# Patient Record
Sex: Male | Born: 1960 | Race: White | Hispanic: No | Marital: Single | Smoking: Never smoker
Health system: Southern US, Community
[De-identification: ages and names within clinical notes are randomized; demographics above are authoritative.]

## PROBLEM LIST (undated history)

## (undated) DIAGNOSIS — J45909 Unspecified asthma, uncomplicated: Secondary | ICD-10-CM

## (undated) DIAGNOSIS — I519 Heart disease, unspecified: Secondary | ICD-10-CM

## (undated) DIAGNOSIS — Z8711 Personal history of peptic ulcer disease: Secondary | ICD-10-CM

## (undated) DIAGNOSIS — M1990 Unspecified osteoarthritis, unspecified site: Secondary | ICD-10-CM

## (undated) DIAGNOSIS — E039 Hypothyroidism, unspecified: Secondary | ICD-10-CM

## (undated) DIAGNOSIS — E119 Type 2 diabetes mellitus without complications: Secondary | ICD-10-CM

## (undated) DIAGNOSIS — I10 Essential (primary) hypertension: Secondary | ICD-10-CM

## (undated) HISTORY — PX: CARDIAC VALVE REPLACEMENT: SHX585

## (undated) HISTORY — PX: CORONARY ARTERY BYPASS GRAFT: SHX141

## (undated) HISTORY — PX: LEG SURGERY: SHX1003

## (undated) HISTORY — PX: ABDOMINAL SURGERY: SHX537

---

## 2001-09-26 ENCOUNTER — Ambulatory Visit (HOSPITAL_COMMUNITY): Admission: RE | Admit: 2001-09-26 | Discharge: 2001-09-26 | Payer: Self-pay | Admitting: Family Medicine

## 2001-09-26 ENCOUNTER — Encounter: Payer: Self-pay | Admitting: Family Medicine

## 2002-04-01 ENCOUNTER — Ambulatory Visit (HOSPITAL_COMMUNITY): Admission: RE | Admit: 2002-04-01 | Discharge: 2002-04-01 | Payer: Self-pay | Admitting: Family Medicine

## 2002-04-01 ENCOUNTER — Encounter: Payer: Self-pay | Admitting: Family Medicine

## 2002-08-12 ENCOUNTER — Emergency Department (HOSPITAL_COMMUNITY): Admission: EM | Admit: 2002-08-12 | Discharge: 2002-08-12 | Payer: Self-pay | Admitting: Emergency Medicine

## 2002-08-12 ENCOUNTER — Encounter: Payer: Self-pay | Admitting: Emergency Medicine

## 2004-03-18 ENCOUNTER — Emergency Department (HOSPITAL_COMMUNITY): Admission: EM | Admit: 2004-03-18 | Discharge: 2004-03-19 | Payer: Self-pay | Admitting: Emergency Medicine

## 2004-03-21 ENCOUNTER — Ambulatory Visit (HOSPITAL_COMMUNITY): Admission: RE | Admit: 2004-03-21 | Discharge: 2004-03-21 | Payer: Self-pay | Admitting: Family Medicine

## 2004-03-25 ENCOUNTER — Ambulatory Visit (HOSPITAL_COMMUNITY): Admission: RE | Admit: 2004-03-25 | Discharge: 2004-03-25 | Payer: Self-pay | Admitting: Family Medicine

## 2005-03-02 ENCOUNTER — Ambulatory Visit (HOSPITAL_COMMUNITY): Admission: RE | Admit: 2005-03-02 | Discharge: 2005-03-02 | Payer: Self-pay | Admitting: Family Medicine

## 2007-12-12 ENCOUNTER — Encounter (HOSPITAL_COMMUNITY): Admission: RE | Admit: 2007-12-12 | Discharge: 2007-12-18 | Payer: Self-pay | Admitting: Orthopedic Surgery

## 2007-12-19 ENCOUNTER — Encounter (HOSPITAL_COMMUNITY): Admission: RE | Admit: 2007-12-19 | Discharge: 2008-01-18 | Payer: Self-pay | Admitting: Orthopedic Surgery

## 2008-01-21 ENCOUNTER — Encounter (HOSPITAL_COMMUNITY): Admission: RE | Admit: 2008-01-21 | Discharge: 2008-02-20 | Payer: Self-pay | Admitting: Orthopedic Surgery

## 2009-04-19 ENCOUNTER — Ambulatory Visit (HOSPITAL_COMMUNITY): Admission: RE | Admit: 2009-04-19 | Discharge: 2009-04-19 | Payer: Self-pay | Admitting: Family Medicine

## 2011-08-08 DIAGNOSIS — J984 Other disorders of lung: Secondary | ICD-10-CM | POA: Insufficient documentation

## 2011-08-08 DIAGNOSIS — Q213 Tetralogy of Fallot: Secondary | ICD-10-CM | POA: Diagnosis not present

## 2011-08-08 DIAGNOSIS — Z8679 Personal history of other diseases of the circulatory system: Secondary | ICD-10-CM | POA: Insufficient documentation

## 2011-08-08 DIAGNOSIS — I10 Essential (primary) hypertension: Secondary | ICD-10-CM | POA: Insufficient documentation

## 2011-08-08 DIAGNOSIS — E119 Type 2 diabetes mellitus without complications: Secondary | ICD-10-CM | POA: Diagnosis not present

## 2011-08-08 DIAGNOSIS — M25562 Pain in left knee: Secondary | ICD-10-CM | POA: Insufficient documentation

## 2011-08-08 DIAGNOSIS — Z9889 Other specified postprocedural states: Secondary | ICD-10-CM | POA: Insufficient documentation

## 2011-08-08 DIAGNOSIS — Z954 Presence of other heart-valve replacement: Secondary | ICD-10-CM | POA: Diagnosis not present

## 2011-11-22 DIAGNOSIS — IMO0002 Reserved for concepts with insufficient information to code with codable children: Secondary | ICD-10-CM | POA: Diagnosis not present

## 2011-11-22 DIAGNOSIS — M1710 Unilateral primary osteoarthritis, unspecified knee: Secondary | ICD-10-CM | POA: Diagnosis not present

## 2011-11-22 DIAGNOSIS — M259 Joint disorder, unspecified: Secondary | ICD-10-CM | POA: Diagnosis not present

## 2011-11-22 DIAGNOSIS — M25569 Pain in unspecified knee: Secondary | ICD-10-CM | POA: Diagnosis not present

## 2011-11-22 DIAGNOSIS — M25469 Effusion, unspecified knee: Secondary | ICD-10-CM | POA: Diagnosis not present

## 2012-02-21 DIAGNOSIS — M1710 Unilateral primary osteoarthritis, unspecified knee: Secondary | ICD-10-CM | POA: Diagnosis not present

## 2012-02-21 DIAGNOSIS — IMO0002 Reserved for concepts with insufficient information to code with codable children: Secondary | ICD-10-CM | POA: Diagnosis not present

## 2012-05-22 DIAGNOSIS — IMO0002 Reserved for concepts with insufficient information to code with codable children: Secondary | ICD-10-CM | POA: Diagnosis not present

## 2012-05-30 ENCOUNTER — Ambulatory Visit (HOSPITAL_COMMUNITY)
Admission: RE | Admit: 2012-05-30 | Discharge: 2012-05-30 | Disposition: A | Discharge status: Home / Self Care | Payer: Medicare Other | Source: Ambulatory Visit | Attending: Orthopedic Surgery | Admitting: Orthopedic Surgery

## 2012-05-30 DIAGNOSIS — M25569 Pain in unspecified knee: Secondary | ICD-10-CM | POA: Insufficient documentation

## 2012-05-30 DIAGNOSIS — IMO0001 Reserved for inherently not codable concepts without codable children: Secondary | ICD-10-CM | POA: Insufficient documentation

## 2012-05-30 DIAGNOSIS — R262 Difficulty in walking, not elsewhere classified: Secondary | ICD-10-CM | POA: Diagnosis not present

## 2012-05-30 DIAGNOSIS — M6281 Muscle weakness (generalized): Secondary | ICD-10-CM | POA: Diagnosis not present

## 2012-05-30 DIAGNOSIS — M25669 Stiffness of unspecified knee, not elsewhere classified: Secondary | ICD-10-CM | POA: Insufficient documentation

## 2012-05-30 DIAGNOSIS — R29898 Other symptoms and signs involving the musculoskeletal system: Secondary | ICD-10-CM | POA: Insufficient documentation

## 2012-05-30 NOTE — Evaluation (Addendum)
Physical Therapy Evaluation  Patient Details  Name: Jay Skinner MRN: [id number] Date of Birth: 07-Aug-1960  Today's Date: 05/30/2012 Time: 0940-1020 PT Time Calculation (min): 40 min              Visit#: 1 of 8  Re-eval: 06/29/12 Assessment Diagnosis: L knee OA Charge:  Eval; there ex x 10 Authorization: medicare    Authorization Visit#: 1 of 8   Past Medical History: No past medical history on file. Past Surgical History: No past surgical history on file.  Subjective Symptoms/Limitations Symptoms: Jay Skinner is a poor historian.  He  states that he had  surgery on his left knee four years ago and he knee has never been the same. He is unsure what surgery was done or why it was done.  The pt has two incisional scars one that runs vertical along the anterior aspect of lower thigh and upper leg that appears to look like a TKR scar.  The second is horizontal along the superior aspcet of  his L LE.   He states the knee just won't bend right. How long can you sit comfortably?: He is able to sit as long as he wants. How long can you stand comfortably?: The patient is able to stand as long as wants. How long can you walk comfortably?: The pateint states that he starts having increase pain when walking after about five minutes.  After 20 minutes he needs to stop walking and rest. Pain Assessment Currently in Pain?: Yes Pain Score:   5 Pain Location: Knee Pain Orientation: Anterior   Cognition/Observation Cognition Overall Cognitive Status: Appears within functional limits for tasks assessed Observation/Other Assessments Observations:  (Pt has antalgic gait with decreased knee flex, ankle dorsiflexion with decreased stance time on Lt  LE)  Sensation/Coordination/Flexibility/Functional Tests Flexibility Thomas: Positive 90/90: Positive  Assessment LLE AROM (degrees) Left Knee Extension: 0 Left Knee Flexion: 115 LLE Strength Left Hip Flexion: 5/5 Left Hip Extension:  3/5 Left Hip ABduction:  (4+/5) Left Hip ADduction: 5/5 Left Knee Flexion: 4/5 Left Knee Extension: 3+/5 Left Ankle Dorsiflexion: 4/5 Left Ankle Plantar Flexion: 2/5  Exercise/Treatments Mobility/Balance  Static Standing Balance Single Leg Stance - Right Leg: 2 Single Leg Stance - Left Leg: 1   Stretches Quad Stretch: 3 reps;30 seconds Hip Flexor Stretch: 1 rep;60 seconds Aerobic Stationary Bike: 6' @ 1.0   Standing Heel Raises: 10 reps SLS: 1" max L; 2" max R Seated Long Arc Quad: 10 reps Supine Quad Sets: 10 reps   Prone  Hamstring Curl: 10 reps Hip Extension: 10 reps    Physical Therapy Assessment and Plan PT Assessment and Plan Clinical Impression Statement: Pt with abnormal gait due to old habits as well as weakness and ROM deficits who will benefit from skilled therapy to improve gait, strength, ROM and balance to improve pt furnctional mobility as well as decrease the risk of falls. Pt will benefit from skilled therapeutic intervention in order to improve on the following deficits: Abnormal gait;Decreased balance;Decreased activity tolerance;Difficulty walking;Pain;Decreased range of motion;Decreased strength Rehab Potential: Good PT Frequency: Min 2X/week PT Duration: 4 weeks PT Treatment/Interventions: Gait training;Therapeutic exercise;Therapeutic activities;Balance training;Patient/family education;Manual techniques;Modalities PT Plan: begin manual on scars; rockerboard, knee flextion, and gait training.  Progress pt to lunge walking and chair stretches.and sit to stand exercises.    Goals Home Exercise Program Pt will Perform Home Exercise Program: Independently PT Short Term Goals Time to Complete Short Term Goals: 2 weeks PT Short Term Goal 1:  Pt ROM to be to 120 to allow pt to go into a squatted posion to pici items off the floor. PT Short Term Goal 2: strength increased 1/2 grade to allow pt to walk fo 15 minutes without pain. PT Short Term Goal 3: Pt  pain no greater than a 3/10 PT Long Term Goals Time to Complete Long Term Goals: 4 weeks PT Long Term Goal 1: I in advance HEP PT Long Term Goal 2: Pt SLS improved to 10 seconds  Bilaterally  to decrease risk of falling Long Term Goal 3: strength improved one grade to allow pt to rise from squatted position. Long Term Goal 4: Pain no greater than a 1/10 80% of the day PT Long Term Goal 5: Pt to be able to walk for 30 minutes without pain.  Problem List Patient Active Problem List  Diagnosis  . Stiffness of knee joint  . Difficulty in walking  . Left leg weakness    PT Plan of Care PT Home Exercise Plan: given  GP Functional Assessment Tool Used: clinical judgement Functional Limitation: Mobility: Walking and moving around Mobility: Walking and Moving Around Current Status ([id number]): At least 40 percent but less than 59% percent impaired, limited or restricted Mobility: Walking and Moving Around Goal Status [phone]): At least 1 percent but less than 20 percent impaired, limited or restricted  RUSSELL,CINDY 05/30/2012, 10:42 AM  Physician Documentation Your signature is required to indicate approval of the treatment plan as stated above.  Please sign and either send electronically or make a copy of this report for your files and return this physician signed original.   Please mark one 1.__approve of plan  2. ___approve of plan with the following conditions.   ______________________________                                                          _____________________ Physician Signature                                                                                                             Date

## 2012-06-03 ENCOUNTER — Ambulatory Visit (HOSPITAL_COMMUNITY)
Admission: RE | Admit: 2012-06-03 | Discharge: 2012-06-03 | Disposition: A | Discharge status: Home / Self Care | Payer: Medicare Other | Source: Ambulatory Visit | Attending: Orthopedic Surgery | Admitting: Orthopedic Surgery

## 2012-06-03 DIAGNOSIS — R29898 Other symptoms and signs involving the musculoskeletal system: Secondary | ICD-10-CM

## 2012-06-03 DIAGNOSIS — R262 Difficulty in walking, not elsewhere classified: Secondary | ICD-10-CM

## 2012-06-03 NOTE — Progress Notes (Signed)
Physical Therapy Treatment Patient Details  Name: AYODEJI KEIMIG MRN: [id number] Date of Birth: [birth year]  Today's Date: 06/03/2012 Time: 1125-1208 PT Time Calculation (min): 43 min  Visit#: 2 of 8  Re-eval: 06/29/12  charge:  39 min exercise  Authorization: medicare     Authorization Visit#: 2 of 8   Subjective: Symptoms/Limitations Symptoms: Mr. Montelongo states that he is doing his exercises at home; statesl his L knee still does not want to bend.; Pain Assessment Pain Score:   4 Pain Location: Knee Pain Orientation: Left    Exercise/Treatments     Stretches Quad Stretch: 3 reps;30 seconds Hip Flexor Stretch: 1 rep;60 seconds Aerobic Stationary Bike: 6' @ 1.0   Standing Heel Raises: 10 reps Knee Flexion: 10 reps Forward Step Up: 10 reps Step Down: 10 reps Functional Squat: 10 reps Rocker Board: 2 minutes SLS: 5" max x 5; 2" max R Other Standing Knee Exercises: at parralel bars work on heel toe gt pattern Seated Long Arc Quad: 10 reps;Weights Long Arc Quad Weight: 3 lbs. Supine Terminal Knee Extension: 10 reps Prone  Hamstring Curl: 10 reps Hip Extension: 10 reps Contract/Relax to Increase Flexion:  (5x)      Physical Therapy Assessment and Plan PT Assessment and Plan Clinical Impression Statement: Pt added several standing exercises with verbal cuing needed for all.  Pt having difficulty with heel/toe gait pattern will need continuted reinforcement.l Pt will benefit from skilled therapeutic intervention in order to improve on the following deficits: Abnormal gait;Decreased balance;Decreased activity tolerance;Difficulty walking;Pain;Decreased range of motion;Decreased strength Rehab Potential: Good PT Frequency: Min 2X/week PT Duration: 4 weeks PT Treatment/Interventions: Gait training;Therapeutic exercise;Therapeutic activities;Balance training;Patient/family education;Manual techniques;Modalities PT Plan:  Progress pt to lunge walking and chair  stretches.and sit to stand exercises.      Problem List Patient Active Problem List  Diagnosis  . Stiffness of knee joint  . Difficulty in walking  . Left leg weakness       GP    Keyuna Cuthrell,CINDY 06/03/2012, 12:17 PM

## 2012-06-06 ENCOUNTER — Ambulatory Visit (HOSPITAL_COMMUNITY)
Admission: RE | Admit: 2012-06-06 | Discharge: 2012-06-06 | Disposition: A | Discharge status: Home / Self Care | Payer: Medicare Other | Source: Ambulatory Visit | Attending: Orthopedic Surgery | Admitting: Orthopedic Surgery

## 2012-06-06 NOTE — Progress Notes (Signed)
Physical Therapy Treatment Patient Details  Name: Jay Skinner MRN: [id number] Date of Birth: 28-Mar-1960  Today's Date: 06/06/2012 Time: 7829-5621 PT Time Calculation (min): 33 min  Visit#: 3 of 8  Re-eval: 06/29/12 Charges: Therex x 30'  Authorization: medicare  Authorization Visit#: 3 of 8   Subjective: Symptoms/Limitations Symptoms: Pt reports that he is able to bend his knee with increased ease. Pain Assessment Currently in Pain?: Yes Pain Score:   4 Pain Location: Knee Pain Orientation: Left   Exercise/Treatments Stretches Quad Stretch: 2 reps;30 seconds;Limitations Lobbyist Limitations: Chair stretch Aerobic Stationary Bike: 8'@3 .0 Standing Heel Raises: 10 reps Knee Flexion: 10 reps Forward Step Up: 10 reps;Step Height: 4";Hand Hold: 0;Left Step Down: 10 reps Functional Squat: 10 reps Stairs: 1RT reciprocal with 1 handrail Rocker Board: 2 minutes SLS: L:5" max of 3 Seated Long Arc Quad: 10 reps;Weights Long Arc Quad Weight: 3 lbs. Other Seated Knee Exercises: Sit to stand without UE assist x 10 Supine Terminal Knee Extension: 10 reps Bridges: 10 reps Prone  Hamstring Curl: 10 reps;Limitations Hamstring Curl Limitations: 3# Hip Extension: 10 reps;Limitations Hip Extension Limitations: 3#   Physical Therapy Assessment and Plan PT Assessment and Plan Clinical Impression Statement: Pt requires multimodal cueing for all exercises for proper form and technique. Pt continues to requiring vc's to improve heel/toe gait pattern. Pt displays increased difficulty with stairs descent secondary to decreased quad control.  Pt reports pain decrease to 3/10 at end of session. PT Plan: Continue to progress strength, ROM and gait per PT POC. Begin lunge walking next session.     Problem List Patient Active Problem List  Diagnosis  . Stiffness of knee joint  . Difficulty in walking  . Left leg weakness    General Behavior During Session: [facility] for tasks  performed Cognition: [facility] for tasks performed  Seth Bake, PTA 06/06/2012, 10:11 AM

## 2012-06-10 ENCOUNTER — Ambulatory Visit (HOSPITAL_COMMUNITY): Payer: Medicare Other | Admitting: *Deleted

## 2012-06-10 DIAGNOSIS — J019 Acute sinusitis, unspecified: Secondary | ICD-10-CM | POA: Diagnosis not present

## 2012-06-10 DIAGNOSIS — Q213 Tetralogy of Fallot: Secondary | ICD-10-CM | POA: Diagnosis not present

## 2012-06-10 DIAGNOSIS — Z6830 Body mass index (BMI) 30.0-30.9, adult: Secondary | ICD-10-CM | POA: Diagnosis not present

## 2012-06-10 DIAGNOSIS — I10 Essential (primary) hypertension: Secondary | ICD-10-CM | POA: Diagnosis not present

## 2012-06-13 ENCOUNTER — Ambulatory Visit (HOSPITAL_COMMUNITY)
Admission: RE | Admit: 2012-06-13 | Discharge: 2012-06-13 | Disposition: A | Discharge status: Home / Self Care | Payer: Medicare Other | Source: Ambulatory Visit | Attending: Orthopedic Surgery | Admitting: Orthopedic Surgery

## 2012-06-13 DIAGNOSIS — R29898 Other symptoms and signs involving the musculoskeletal system: Secondary | ICD-10-CM

## 2012-06-13 DIAGNOSIS — R262 Difficulty in walking, not elsewhere classified: Secondary | ICD-10-CM

## 2012-06-13 NOTE — Progress Notes (Signed)
Physical Therapy Treatment Patient Details  Name: Jay Skinner MRN: [id number] Date of Birth: [birth year]  Today's Date: 06/13/2012 Time: 4098-1191 PT Time Calculation (min): 43 min  Visit#: 4 of 8  Re-eval: 06/28/12  charge:  There ex x 42  Authorization: medicare   Authorization Visit#: 4 of 8   Subjective: Symptoms/Limitations Symptoms: Mr. Dinovo states the cold is causing his knee to ache; Pt states he is doing his exercises and walking is easier for him now. Pain Assessment Pain Score:   3 Pain Location: Knee Pain Orientation: Left  Exercise/Treatments Stretches Quad Stretch: 2 reps;30 seconds;Limitations Lobbyist Limitations: Chair stretch Hip Flexor Stretch: 1 rep;60 seconds Aerobic Stationary Bike: 8'@3 .0 Standing Heel Raises: 15 reps Lateral Step Up: 10 reps;Step Height: 6" Forward Step Up: Left;10 reps;Hand Hold: 0;Step Height: 6" Step Down: 10 reps;Step Height: 6" Functional Squat: 10 reps Stairs: 2RT reciprocal with 1 handrail Rocker Board: 2 minutes SLS: L:5" max of 3 Seated Other Seated Knee Exercises: Sit to stand without UE assist x 10 Supine Bridges: 10 reps Other Supine Knee Exercises: Knee to chest x 1' x 2 Sidelying   Prone  Hip Extension: 10 reps;Limitations Hip Extension Limitations: 4#      Physical Therapy Assessment and Plan PT Assessment and Plan Clinical Impression Statement: Pt neees multimodal cueing for all exercises. Pt is improving in dorsiflexing ankle and knee with gait but is now flexing hip to approximately 60 degrees when doing so; therefore gait pattern is still not normal.   Rehab Potential: Good PT Frequency: Min 2X/week PT Duration: 4 weeks PT Treatment/Interventions: Gait training;Therapeutic exercise;Therapeutic activities;Balance training;Patient/family education;Manual techniques;Modalities PT Plan: Work on gait and attempt mrising from squatted position.      Goals Home Exercise Program Pt will  Perform Home Exercise Program: Independently PT Goal: Perform Home Exercise Program - Progress: Met PT Short Term Goals Time to Complete Short Term Goals: 2 weeks PT Short Term Goal 1: Pt ROM to be to 120 to allow pt to go into a squatted posion to pici items off the floor. PT Short Term Goal 1 - Progress: Progressing toward goal PT Short Term Goal 2: strength increased 1/2 grade to allow pt to walk fo 15 minutes without pain. PT Short Term Goal 2 - Progress: Met PT Short Term Goal 3: Pt pain no greater than a 3/10 PT Short Term Goal 3 - Progress: Progressing toward goal PT Long Term Goals Time to Complete Long Term Goals: 4 weeks PT Long Term Goal 1: I in advance HEP PT Long Term Goal 1 - Progress: Met PT Long Term Goal 2: Pt SLS improved to 10 seconds  Bilaterally  to decrease risk of falling PT Long Term Goal 2 - Progress: Progressing toward goal Long Term Goal 3: strength improved one grade to allow pt to rise from squatted position. Long Term Goal 3 Progress: Progressing toward goal Long Term Goal 4: Pain no greater than a 1/10 80% of the day Long Term Goal 4 Progress: Not met PT Long Term Goal 5: Pt to be able to walk for 30 minutes without pain. Long Term Goal 5 Progress: Not met  Problem List Patient Active Problem List  Diagnosis  . Stiffness of knee joint  . Difficulty in walking  . Left leg weakness    General Behavior During Session: [facility] for tasks performed Cognition: [facility] for tasks performed  GP    Shontelle Muska,CINDY 06/13/2012, 10:45 AM

## 2012-06-17 ENCOUNTER — Ambulatory Visit (HOSPITAL_COMMUNITY)
Admission: RE | Admit: 2012-06-17 | Discharge: 2012-06-17 | Disposition: A | Discharge status: Home / Self Care | Payer: Medicare Other | Source: Ambulatory Visit | Attending: Orthopedic Surgery | Admitting: Orthopedic Surgery

## 2012-06-17 DIAGNOSIS — R29898 Other symptoms and signs involving the musculoskeletal system: Secondary | ICD-10-CM

## 2012-06-17 DIAGNOSIS — R262 Difficulty in walking, not elsewhere classified: Secondary | ICD-10-CM

## 2012-06-17 NOTE — Progress Notes (Signed)
out Physical Therapy Treatment Patient Details  Name: Jay Skinner MRN: [id number] Date of Birth: Jul 14, 1960  Today's Date: 06/17/2012 Time: 0940-1020 PT Time Calculation (min): 40 min  Visit#: 5 of 8  Re-eval: 06/28/12  charge:  Leonia Reeves x 12/ There ex x 27  Authorization: medicare     Authorization Visit#: 5 of 8   Subjective: Symptoms/Limitations Symptoms: State that his leg is a little sore. Pain Assessment Pain Score:   4 Pain Location: Knee Pain Orientation: Left    Exercise/Treatments    Stretches Quad Stretch: 2 reps;30 seconds;Limitations Hip Flexor Stretch: 1 rep;60 seconds Aerobic Stationary Bike: 8'@3 .0   Standing Heel Raises: 15 reps Lateral Step Up: 15 reps;Step Height: 6" Forward Step Up: Left;15 reps;Hand Hold: 0;Step Height: 6" Functional Squat: 10 reps (deep squat) Rocker Board: 2 minutes SLS: L:5" max of 3 Other Standing Knee Exercises: working on gait. heel toe Seated Other Seated Knee Exercises: Sit to stand without UE assist x 10    Physical Therapy Assessment and Plan PT Assessment and Plan Clinical Impression Statement: PT continues ton need multimodal cuing with gait but he is showing better form with exercises with minimal cuing.  Pt brother states that he was telling pateint to pick up his Lt leg,(thus pt was walking and flexing hip to 60 degrees causing an abnormal gt.)   Instructed pt's brother that it would be better to cue him to hit with his heel and push off with his toe when ambulating.  Pt able to demonstrate a near normal gati pattern at the end of this session. Pt will benefit from skilled therapeutic intervention in order to improve on the following deficits: Abnormal gait;Decreased balance;Decreased activity tolerance;Difficulty walking;Pain;Decreased range of motion;Decreased strength PT Frequency: Min 2X/week PT Duration: 4 weeks PT Treatment/Interventions: Gait training;Therapeutic exercise;Therapeutic activities;Balance  training;Patient/family education;Manual techniques;Modalities PT Plan: continue on working on gait pattern repeatitively.    Goals  progressing  Problem List Patient Active Problem List  Diagnosis  . Stiffness of knee joint  . Difficulty in walking  . Left leg weakness    PT - End of Session Equipment Utilized During Treatment: Gait belt General Behavior During Session: [facility] for tasks performed Cognition: [facility] for tasks performed  GP    [name],CINDY 06/17/2012, 10:35 AM

## 2012-06-20 ENCOUNTER — Ambulatory Visit (HOSPITAL_COMMUNITY)
Admission: RE | Admit: 2012-06-20 | Discharge: 2012-06-20 | Disposition: A | Discharge status: Home / Self Care | Payer: Medicare Other | Source: Ambulatory Visit | Attending: Orthopedic Surgery | Admitting: Orthopedic Surgery

## 2012-06-20 DIAGNOSIS — M6281 Muscle weakness (generalized): Secondary | ICD-10-CM | POA: Diagnosis not present

## 2012-06-20 DIAGNOSIS — M25669 Stiffness of unspecified knee, not elsewhere classified: Secondary | ICD-10-CM | POA: Insufficient documentation

## 2012-06-20 DIAGNOSIS — R262 Difficulty in walking, not elsewhere classified: Secondary | ICD-10-CM | POA: Insufficient documentation

## 2012-06-20 DIAGNOSIS — M25569 Pain in unspecified knee: Secondary | ICD-10-CM | POA: Insufficient documentation

## 2012-06-20 DIAGNOSIS — IMO0001 Reserved for inherently not codable concepts without codable children: Secondary | ICD-10-CM | POA: Diagnosis not present

## 2012-06-20 NOTE — Progress Notes (Signed)
Physical Therapy Treatment Patient Details  Name: Jay Skinner MRN: [id number] Date of Birth: September 01, 1960  Today's Date: 06/20/2012 Time: 0930-1005 PT Time Calculation (min): 35 min  Visit#: 6 of 8  Re-eval: 06/28/12 Charges: Therex x 23' Gait x 8'  Authorization: medicare  Authorization Visit#: 6 of 8   Subjective: Symptoms/Limitations Symptoms: Pt states that he has been doing his quad stretch every night and it has really helped. Pain Assessment Currently in Pain?: Yes Pain Score:   3 Pain Location: Knee Pain Orientation: Left   Exercise/Treatments Aerobic Stationary Bike: 10'@3 .0 for strength and activity tolerance Standing Heel Raises: 15 reps;Limitations Heel Raises Limitations: toe raises x 15 Lateral Step Up: 15 reps;Step Height: 6" Forward Step Up: 15 reps;Step Height: 6";Hand Hold: 0;Left Functional Squat: 15 reps Rocker Board: 2 minutes Other Standing Knee Exercises: working on gait. heel toe Seated Other Seated Knee Exercises: Sit to stand without UE assist x 10  Physical Therapy Assessment and Plan PT Assessment and Plan Clinical Impression Statement: Pt continues to require moderate multimodal cueing for each exercises and gait. Pt's strength is progressing well and pain appears to be decreasing. Pt continues to lack balance. Began tandem gait to improve proprioceptive control. Pt reports decreased pain at end of session. PT Plan: Continue to progress strength, stability and gait per PT POC.    Problem List Patient Active Problem List  Diagnosis  . Stiffness of knee joint  . Difficulty in walking  . Left leg weakness    PT - End of Session Activity Tolerance: Patient tolerated treatment well General Behavior During Session: [facility] for tasks performed Cognition: [facility] for tasks performed  Seth Bake, PTA  06/20/2012, 10:14 AM

## 2012-06-24 ENCOUNTER — Ambulatory Visit (HOSPITAL_COMMUNITY): Payer: Medicare Other | Admitting: *Deleted

## 2012-06-25 DIAGNOSIS — Z0000 Encounter for general adult medical examination without abnormal findings: Secondary | ICD-10-CM | POA: Diagnosis not present

## 2012-06-25 DIAGNOSIS — J45909 Unspecified asthma, uncomplicated: Secondary | ICD-10-CM | POA: Diagnosis not present

## 2012-06-25 DIAGNOSIS — F79 Unspecified intellectual disabilities: Secondary | ICD-10-CM | POA: Diagnosis not present

## 2012-06-25 DIAGNOSIS — Z23 Encounter for immunization: Secondary | ICD-10-CM | POA: Diagnosis not present

## 2012-06-25 DIAGNOSIS — J019 Acute sinusitis, unspecified: Secondary | ICD-10-CM | POA: Diagnosis not present

## 2012-06-25 DIAGNOSIS — Z6832 Body mass index (BMI) 32.0-32.9, adult: Secondary | ICD-10-CM | POA: Diagnosis not present

## 2012-06-27 ENCOUNTER — Ambulatory Visit (HOSPITAL_COMMUNITY)
Admission: RE | Admit: 2012-06-27 | Discharge: 2012-06-27 | Disposition: A | Discharge status: Home / Self Care | Payer: Medicare Other | Source: Ambulatory Visit | Attending: Orthopedic Surgery | Admitting: Orthopedic Surgery

## 2012-06-27 DIAGNOSIS — R262 Difficulty in walking, not elsewhere classified: Secondary | ICD-10-CM

## 2012-06-27 DIAGNOSIS — M25569 Pain in unspecified knee: Secondary | ICD-10-CM | POA: Diagnosis not present

## 2012-06-27 DIAGNOSIS — M25669 Stiffness of unspecified knee, not elsewhere classified: Secondary | ICD-10-CM | POA: Diagnosis not present

## 2012-06-27 DIAGNOSIS — M6281 Muscle weakness (generalized): Secondary | ICD-10-CM | POA: Diagnosis not present

## 2012-06-27 DIAGNOSIS — IMO0001 Reserved for inherently not codable concepts without codable children: Secondary | ICD-10-CM | POA: Diagnosis not present

## 2012-06-27 DIAGNOSIS — R29898 Other symptoms and signs involving the musculoskeletal system: Secondary | ICD-10-CM

## 2012-06-27 NOTE — Progress Notes (Signed)
Physical Therapy Treatment Patient Details  Name: CALLIN ASHE MRN: [id number] Date of Birth: Jul 24, 1960  Today's Date: 06/27/2012 Time: 0930-1010 PT Time Calculation (min): 40 min  Visit#: 7 of 8  Re-eval: 06/28/12    Authorization: medicare  Charge: there ex x 38'  Authorization Visit#: 7 of 8   Subjective: Symptoms/Limitations Symptoms: Pt states that he does not have any pain in his knee anylonger unless he is stressing it. Pain Assessment Pain Score:   2 Pain Location: Knee Pain Orientation: Left  Exercise/Treatments   Aerobic Stationary Bike: 8'@3 .0 for strength and activity tolerance   Standing Heel Raises: 15 reps;Limitations Lateral Step Up: 15 reps;Step Height: 6" Forward Step Up: 15 reps;Step Height: 6";Hand Hold: 0;Left Step Down: 15 reps Functional Squat: 15 reps Wall Squat: 10 reps Lunge Walking - Round Trips: 2 RT Stairs:  (2 RT) Rocker Board: 2 minutes SLS: L x 8" max Gait Training: for heel toe gt. Other Standing Knee Exercises: lunge with L LE on Bosu    Physical Therapy Assessment and Plan PT Assessment and Plan Clinical Impression Statement: Pt needed multimodal cueing for lunge walking, verbal cuing for lunges onto bosu.  Pt continues to lack push-off with gait although he is doing much better with the heelstrike.   PT Treatment/Interventions: Gait training;Therapeutic exercise;Therapeutic activities;Balance training;Patient/family education;Manual techniques;Modalities PT Plan: Continue to progress strength, stability and gait per PT POC.    Goals  progressing  Problem List Patient Active Problem List  Diagnosis  . Stiffness of knee joint  . Difficulty in walking  . Left leg weakness    PT - End of Session Activity Tolerance: Patient tolerated treatment well General Behavior During Session: [facility] for tasks performed Cognition: [facility] for tasks performed  GP    Erlean Mealor,CINDY 06/27/2012, 12:16 PM

## 2012-07-01 ENCOUNTER — Ambulatory Visit (HOSPITAL_COMMUNITY): Payer: Medicare Other | Admitting: *Deleted

## 2012-07-02 ENCOUNTER — Ambulatory Visit (HOSPITAL_COMMUNITY)
Admission: RE | Admit: 2012-07-02 | Discharge: 2012-07-02 | Disposition: A | Discharge status: Home / Self Care | Payer: Medicare Other | Source: Ambulatory Visit | Attending: Orthopedic Surgery | Admitting: Orthopedic Surgery

## 2012-07-02 DIAGNOSIS — R262 Difficulty in walking, not elsewhere classified: Secondary | ICD-10-CM | POA: Diagnosis not present

## 2012-07-02 DIAGNOSIS — M25569 Pain in unspecified knee: Secondary | ICD-10-CM | POA: Diagnosis not present

## 2012-07-02 DIAGNOSIS — M6281 Muscle weakness (generalized): Secondary | ICD-10-CM | POA: Diagnosis not present

## 2012-07-02 DIAGNOSIS — M25669 Stiffness of unspecified knee, not elsewhere classified: Secondary | ICD-10-CM | POA: Diagnosis not present

## 2012-07-02 DIAGNOSIS — IMO0001 Reserved for inherently not codable concepts without codable children: Secondary | ICD-10-CM | POA: Diagnosis not present

## 2012-07-02 NOTE — Evaluation (Addendum)
Physical Therapy Re-evaluation/Discharge Summary  Patient Details  Name: Jay Skinner MRN: [id number] Date of Birth: 1960/10/06  Today's Date: 07/02/2012 Time: 1140-1200 PT Time Calculation (min): 20 min              Visit#: 8 of 8  Re-eval: 06/28/12 Charges: MMT x 1 ROMM x 1   Authorization: medicare    Authorization Visit#: 8 of 8   Past Medical History: No past medical history on file. Past Surgical History: No past surgical history on file.  Subjective Symptoms/Limitations Symptoms: Pt states that he feels like he's doing much better and he's comfortable with d/c to HEP. Pain Assessment Currently in Pain?: No/denies   Sensation/Coordination/Flexibility/Functional Tests Functional Tests Functional Tests: LEFS 59/20 (was 39/80)  Assessment LLE AROM (degrees) Left Knee Extension: 0 Left Knee Flexion: 122 (was 115) LLE Strength Left Hip Flexion: 5/5 Left Hip Extension: 5/5 (was 3/5) Left Hip ABduction: 5/5 (was 4+/5) Left Hip ADduction: 5/5 Left Knee Flexion: 5/5 (was 4/5) Left Knee Extension: 5/5 (was 3+/5) Left Ankle Dorsiflexion: 5/5 (was 4/5) Left Ankle Plantar Flexion: 4/5 (was 2/5)  Physical Therapy Assessment and Plan PT Assessment and Plan Clinical Impression Statement: Pt has met all goals except for SLS which is 8". Strength is WFL. LEFS improved 20 pts. Pt's gait mechanics have improved. Pt is comfortable with d/c to HEP.  PT Treatment/Interventions: Gait training;Therapeutic exercise;Therapeutic activities;Balance training;Patient/family education;Manual techniques;Modalities PT Plan: Recommend to d/c to [facility]P.    Goals Home Exercise Program Pt will Perform Home Exercise Program: Independently PT Short Term Goals Time to Complete Short Term Goals: 2 weeks PT Short Term Goal 1: Pt ROM to be to 120 to allow pt to go into a squatted posion to pici items off the floor. PT Short Term Goal 1 - Progress: Met PT Short Term Goal 2: strength increased 1/2  grade to allow pt to walk fo 15 minutes without pain. PT Short Term Goal 2 - Progress: Met PT Short Term Goal 3: Pt pain no greater than a 3/10 PT Short Term Goal 3 - Progress: Met PT Long Term Goals Time to Complete Long Term Goals: 4 weeks PT Long Term Goal 1: I in advance HEP PT Long Term Goal 1 - Progress: Met PT Long Term Goal 2: Pt SLS improved to 10 seconds  Bilaterally  to decrease risk of falling PT Long Term Goal 2 - Progress: Partly met (SLS max 8") Long Term Goal 3: strength improved one grade to allow pt to rise from squatted position. Long Term Goal 3 Progress: Met Long Term Goal 4: Pain no greater than a 1/10 80% of the day Long Term Goal 4 Progress: Met PT Long Term Goal 5: Pt to be able to walk for 30 minutes without pain. Long Term Goal 5 Progress: Met  Problem List Patient Active Problem List  Diagnosis  . Stiffness of knee joint  . Difficulty in walking  . Left leg weakness    PT - End of Session Activity Tolerance: Patient tolerated treatment well General Behavior During Session: [facility] for tasks performed Cognition: [facility] for tasks performed  GP Functional Limitation: Mobility: Walking and moving around Mobility: Walking and Moving Around Goal Status [phone]): At least 1 percent but less than 20 percent impaired, limited or restricted Mobility: Walking and Moving Around Discharge Status [phone]): At least 1 percent but less than 20 percent impaired, limited or restricted  Seth Bake, PTA  07/02/2012, 12:07 PM  Physician Documentation Your signature is required  to indicate approval of the treatment plan as stated above.  Please sign and either send electronically or make a copy of this report for your files and return this physician signed original.   Please mark one 1.__approve of plan  2. ___approve of plan with the following conditions.   ______________________________                                                           _____________________ Physician Signature                                                                                                             Date

## 2012-08-13 DIAGNOSIS — J984 Other disorders of lung: Secondary | ICD-10-CM | POA: Diagnosis not present

## 2012-08-13 DIAGNOSIS — Z8679 Personal history of other diseases of the circulatory system: Secondary | ICD-10-CM | POA: Diagnosis not present

## 2012-08-13 DIAGNOSIS — I10 Essential (primary) hypertension: Secondary | ICD-10-CM | POA: Diagnosis not present

## 2012-08-13 DIAGNOSIS — I369 Nonrheumatic tricuspid valve disorder, unspecified: Secondary | ICD-10-CM | POA: Diagnosis not present

## 2012-08-13 DIAGNOSIS — Z9889 Other specified postprocedural states: Secondary | ICD-10-CM | POA: Diagnosis not present

## 2012-08-13 DIAGNOSIS — Q213 Tetralogy of Fallot: Secondary | ICD-10-CM | POA: Diagnosis not present

## 2012-08-16 DIAGNOSIS — Q213 Tetralogy of Fallot: Secondary | ICD-10-CM | POA: Diagnosis not present

## 2012-09-05 DIAGNOSIS — Q213 Tetralogy of Fallot: Secondary | ICD-10-CM | POA: Diagnosis not present

## 2012-12-19 DIAGNOSIS — J984 Other disorders of lung: Secondary | ICD-10-CM | POA: Diagnosis not present

## 2012-12-19 DIAGNOSIS — Q213 Tetralogy of Fallot: Secondary | ICD-10-CM | POA: Diagnosis not present

## 2012-12-19 DIAGNOSIS — IMO0002 Reserved for concepts with insufficient information to code with codable children: Secondary | ICD-10-CM | POA: Diagnosis not present

## 2012-12-19 DIAGNOSIS — M1710 Unilateral primary osteoarthritis, unspecified knee: Secondary | ICD-10-CM | POA: Diagnosis not present

## 2012-12-19 DIAGNOSIS — I10 Essential (primary) hypertension: Secondary | ICD-10-CM | POA: Diagnosis not present

## 2013-01-17 DIAGNOSIS — Z23 Encounter for immunization: Secondary | ICD-10-CM | POA: Diagnosis not present

## 2013-04-05 DIAGNOSIS — Z6831 Body mass index (BMI) 31.0-31.9, adult: Secondary | ICD-10-CM | POA: Diagnosis not present

## 2013-04-05 DIAGNOSIS — J111 Influenza due to unidentified influenza virus with other respiratory manifestations: Secondary | ICD-10-CM | POA: Diagnosis not present

## 2013-04-05 DIAGNOSIS — M1990 Unspecified osteoarthritis, unspecified site: Secondary | ICD-10-CM | POA: Diagnosis not present

## 2013-04-24 DIAGNOSIS — J0100 Acute maxillary sinusitis, unspecified: Secondary | ICD-10-CM | POA: Diagnosis not present

## 2013-04-24 DIAGNOSIS — I10 Essential (primary) hypertension: Secondary | ICD-10-CM | POA: Diagnosis not present

## 2013-04-24 DIAGNOSIS — B999 Unspecified infectious disease: Secondary | ICD-10-CM | POA: Diagnosis not present

## 2013-04-24 DIAGNOSIS — Z6832 Body mass index (BMI) 32.0-32.9, adult: Secondary | ICD-10-CM | POA: Diagnosis not present

## 2013-06-10 DIAGNOSIS — Z9889 Other specified postprocedural states: Secondary | ICD-10-CM | POA: Diagnosis not present

## 2013-06-10 DIAGNOSIS — Q213 Tetralogy of Fallot: Secondary | ICD-10-CM | POA: Diagnosis not present

## 2013-06-10 DIAGNOSIS — Z86718 Personal history of other venous thrombosis and embolism: Secondary | ICD-10-CM | POA: Diagnosis not present

## 2013-06-10 DIAGNOSIS — I10 Essential (primary) hypertension: Secondary | ICD-10-CM | POA: Diagnosis not present

## 2013-06-10 DIAGNOSIS — J984 Other disorders of lung: Secondary | ICD-10-CM | POA: Diagnosis not present

## 2013-06-10 DIAGNOSIS — M79609 Pain in unspecified limb: Secondary | ICD-10-CM | POA: Diagnosis not present

## 2013-06-10 DIAGNOSIS — Z79899 Other long term (current) drug therapy: Secondary | ICD-10-CM | POA: Diagnosis not present

## 2013-06-10 DIAGNOSIS — R609 Edema, unspecified: Secondary | ICD-10-CM | POA: Diagnosis not present

## 2013-06-23 DIAGNOSIS — M25569 Pain in unspecified knee: Secondary | ICD-10-CM | POA: Diagnosis not present

## 2013-06-23 DIAGNOSIS — M1710 Unilateral primary osteoarthritis, unspecified knee: Secondary | ICD-10-CM | POA: Diagnosis not present

## 2013-06-23 DIAGNOSIS — IMO0002 Reserved for concepts with insufficient information to code with codable children: Secondary | ICD-10-CM | POA: Diagnosis not present

## 2013-07-02 ENCOUNTER — Other Ambulatory Visit (HOSPITAL_COMMUNITY): Payer: Self-pay | Admitting: Family Medicine

## 2013-07-02 ENCOUNTER — Ambulatory Visit (HOSPITAL_COMMUNITY)
Admission: RE | Admit: 2013-07-02 | Discharge: 2013-07-02 | Disposition: A | Discharge status: Home / Self Care | Payer: Medicare Other | Source: Ambulatory Visit | Attending: Family Medicine | Admitting: Family Medicine

## 2013-07-02 DIAGNOSIS — Z6833 Body mass index (BMI) 33.0-33.9, adult: Secondary | ICD-10-CM | POA: Diagnosis not present

## 2013-07-02 DIAGNOSIS — R0602 Shortness of breath: Secondary | ICD-10-CM | POA: Diagnosis not present

## 2013-07-02 DIAGNOSIS — J209 Acute bronchitis, unspecified: Secondary | ICD-10-CM | POA: Diagnosis not present

## 2013-07-02 DIAGNOSIS — J069 Acute upper respiratory infection, unspecified: Secondary | ICD-10-CM | POA: Diagnosis not present

## 2013-07-02 DIAGNOSIS — Z79899 Other long term (current) drug therapy: Secondary | ICD-10-CM | POA: Diagnosis not present

## 2013-07-02 DIAGNOSIS — R05 Cough: Secondary | ICD-10-CM | POA: Insufficient documentation

## 2013-07-02 DIAGNOSIS — R509 Fever, unspecified: Secondary | ICD-10-CM | POA: Diagnosis not present

## 2013-07-02 DIAGNOSIS — R059 Cough, unspecified: Secondary | ICD-10-CM | POA: Diagnosis not present

## 2013-07-02 DIAGNOSIS — E119 Type 2 diabetes mellitus without complications: Secondary | ICD-10-CM | POA: Diagnosis not present

## 2013-07-02 DIAGNOSIS — R0989 Other specified symptoms and signs involving the circulatory and respiratory systems: Secondary | ICD-10-CM | POA: Diagnosis not present

## 2013-07-02 DIAGNOSIS — Q213 Tetralogy of Fallot: Secondary | ICD-10-CM | POA: Diagnosis not present

## 2013-07-20 DIAGNOSIS — Q213 Tetralogy of Fallot: Secondary | ICD-10-CM | POA: Diagnosis not present

## 2013-07-20 DIAGNOSIS — I509 Heart failure, unspecified: Secondary | ICD-10-CM | POA: Diagnosis not present

## 2013-07-20 DIAGNOSIS — R0789 Other chest pain: Secondary | ICD-10-CM | POA: Diagnosis not present

## 2013-07-20 DIAGNOSIS — R609 Edema, unspecified: Secondary | ICD-10-CM | POA: Diagnosis not present

## 2013-07-20 DIAGNOSIS — J811 Chronic pulmonary edema: Secondary | ICD-10-CM | POA: Diagnosis not present

## 2013-07-20 DIAGNOSIS — J90 Pleural effusion, not elsewhere classified: Secondary | ICD-10-CM | POA: Diagnosis not present

## 2013-07-20 DIAGNOSIS — R0902 Hypoxemia: Secondary | ICD-10-CM | POA: Diagnosis not present

## 2013-07-20 DIAGNOSIS — I517 Cardiomegaly: Secondary | ICD-10-CM | POA: Diagnosis not present

## 2013-07-20 DIAGNOSIS — Z8679 Personal history of other diseases of the circulatory system: Secondary | ICD-10-CM | POA: Diagnosis not present

## 2013-07-21 DIAGNOSIS — Z954 Presence of other heart-valve replacement: Secondary | ICD-10-CM | POA: Diagnosis not present

## 2013-07-21 DIAGNOSIS — I4892 Unspecified atrial flutter: Secondary | ICD-10-CM | POA: Diagnosis present

## 2013-07-21 DIAGNOSIS — G253 Myoclonus: Secondary | ICD-10-CM | POA: Diagnosis not present

## 2013-07-21 DIAGNOSIS — I509 Heart failure, unspecified: Secondary | ICD-10-CM | POA: Diagnosis not present

## 2013-07-21 DIAGNOSIS — R0902 Hypoxemia: Secondary | ICD-10-CM | POA: Diagnosis not present

## 2013-07-21 DIAGNOSIS — R609 Edema, unspecified: Secondary | ICD-10-CM | POA: Diagnosis not present

## 2013-07-21 DIAGNOSIS — R000 Tachycardia, unspecified: Secondary | ICD-10-CM | POA: Diagnosis not present

## 2013-07-21 DIAGNOSIS — E039 Hypothyroidism, unspecified: Secondary | ICD-10-CM | POA: Diagnosis present

## 2013-07-21 DIAGNOSIS — R918 Other nonspecific abnormal finding of lung field: Secondary | ICD-10-CM | POA: Diagnosis not present

## 2013-07-21 DIAGNOSIS — J811 Chronic pulmonary edema: Secondary | ICD-10-CM | POA: Diagnosis not present

## 2013-07-21 DIAGNOSIS — R0609 Other forms of dyspnea: Secondary | ICD-10-CM | POA: Diagnosis not present

## 2013-07-21 DIAGNOSIS — E873 Alkalosis: Secondary | ICD-10-CM | POA: Diagnosis present

## 2013-07-21 DIAGNOSIS — I517 Cardiomegaly: Secondary | ICD-10-CM | POA: Diagnosis not present

## 2013-07-21 DIAGNOSIS — J9620 Acute and chronic respiratory failure, unspecified whether with hypoxia or hypercapnia: Secondary | ICD-10-CM | POA: Diagnosis present

## 2013-07-21 DIAGNOSIS — J90 Pleural effusion, not elsewhere classified: Secondary | ICD-10-CM | POA: Diagnosis not present

## 2013-07-21 DIAGNOSIS — Q213 Tetralogy of Fallot: Secondary | ICD-10-CM | POA: Diagnosis not present

## 2013-07-21 DIAGNOSIS — E209 Hypoparathyroidism, unspecified: Secondary | ICD-10-CM | POA: Diagnosis present

## 2013-07-21 DIAGNOSIS — J189 Pneumonia, unspecified organism: Secondary | ICD-10-CM | POA: Diagnosis not present

## 2013-07-21 DIAGNOSIS — I079 Rheumatic tricuspid valve disease, unspecified: Secondary | ICD-10-CM | POA: Diagnosis not present

## 2013-07-21 DIAGNOSIS — Z8679 Personal history of other diseases of the circulatory system: Secondary | ICD-10-CM | POA: Diagnosis not present

## 2013-07-21 DIAGNOSIS — J986 Disorders of diaphragm: Secondary | ICD-10-CM | POA: Diagnosis not present

## 2013-07-21 DIAGNOSIS — E669 Obesity, unspecified: Secondary | ICD-10-CM | POA: Diagnosis present

## 2013-07-21 DIAGNOSIS — I10 Essential (primary) hypertension: Secondary | ICD-10-CM | POA: Diagnosis present

## 2013-07-21 DIAGNOSIS — R0789 Other chest pain: Secondary | ICD-10-CM | POA: Diagnosis not present

## 2013-07-30 DIAGNOSIS — J189 Pneumonia, unspecified organism: Secondary | ICD-10-CM | POA: Diagnosis not present

## 2013-07-30 DIAGNOSIS — Z6830 Body mass index (BMI) 30.0-30.9, adult: Secondary | ICD-10-CM | POA: Diagnosis not present

## 2013-09-16 DIAGNOSIS — J984 Other disorders of lung: Secondary | ICD-10-CM | POA: Diagnosis not present

## 2013-09-16 DIAGNOSIS — Z5181 Encounter for therapeutic drug level monitoring: Secondary | ICD-10-CM | POA: Diagnosis not present

## 2013-09-16 DIAGNOSIS — Q213 Tetralogy of Fallot: Secondary | ICD-10-CM | POA: Diagnosis not present

## 2013-09-16 DIAGNOSIS — I10 Essential (primary) hypertension: Secondary | ICD-10-CM | POA: Diagnosis not present

## 2013-09-16 DIAGNOSIS — Z954 Presence of other heart-valve replacement: Secondary | ICD-10-CM | POA: Diagnosis not present

## 2013-09-16 DIAGNOSIS — Z79899 Other long term (current) drug therapy: Secondary | ICD-10-CM | POA: Diagnosis not present

## 2013-09-16 DIAGNOSIS — Z9889 Other specified postprocedural states: Secondary | ICD-10-CM | POA: Diagnosis not present

## 2013-10-03 DIAGNOSIS — I509 Heart failure, unspecified: Secondary | ICD-10-CM | POA: Diagnosis not present

## 2013-10-03 DIAGNOSIS — Z6827 Body mass index (BMI) 27.0-27.9, adult: Secondary | ICD-10-CM | POA: Diagnosis not present

## 2013-10-03 DIAGNOSIS — I10 Essential (primary) hypertension: Secondary | ICD-10-CM | POA: Diagnosis not present

## 2013-11-27 DIAGNOSIS — Z6827 Body mass index (BMI) 27.0-27.9, adult: Secondary | ICD-10-CM | POA: Diagnosis not present

## 2013-11-27 DIAGNOSIS — I509 Heart failure, unspecified: Secondary | ICD-10-CM | POA: Diagnosis not present

## 2013-11-27 DIAGNOSIS — N289 Disorder of kidney and ureter, unspecified: Secondary | ICD-10-CM | POA: Diagnosis not present

## 2013-12-23 DIAGNOSIS — M1730 Unilateral post-traumatic osteoarthritis, unspecified knee: Secondary | ICD-10-CM | POA: Insufficient documentation

## 2013-12-23 DIAGNOSIS — M1732 Unilateral post-traumatic osteoarthritis, left knee: Secondary | ICD-10-CM | POA: Diagnosis not present

## 2014-01-08 DIAGNOSIS — Z23 Encounter for immunization: Secondary | ICD-10-CM | POA: Diagnosis not present

## 2014-01-14 DIAGNOSIS — E1129 Type 2 diabetes mellitus with other diabetic kidney complication: Secondary | ICD-10-CM | POA: Diagnosis not present

## 2014-01-14 DIAGNOSIS — Z0000 Encounter for general adult medical examination without abnormal findings: Secondary | ICD-10-CM | POA: Diagnosis not present

## 2014-01-14 DIAGNOSIS — Z23 Encounter for immunization: Secondary | ICD-10-CM | POA: Diagnosis not present

## 2014-01-14 DIAGNOSIS — Z6827 Body mass index (BMI) 27.0-27.9, adult: Secondary | ICD-10-CM | POA: Diagnosis not present

## 2014-01-14 DIAGNOSIS — Z125 Encounter for screening for malignant neoplasm of prostate: Secondary | ICD-10-CM | POA: Diagnosis not present

## 2014-01-15 ENCOUNTER — Encounter (INDEPENDENT_AMBULATORY_CARE_PROVIDER_SITE_OTHER): Payer: Self-pay | Admitting: *Deleted

## 2014-03-24 DIAGNOSIS — J984 Other disorders of lung: Secondary | ICD-10-CM | POA: Diagnosis not present

## 2014-03-24 DIAGNOSIS — Z9889 Other specified postprocedural states: Secondary | ICD-10-CM | POA: Diagnosis not present

## 2014-03-24 DIAGNOSIS — Z8679 Personal history of other diseases of the circulatory system: Secondary | ICD-10-CM | POA: Diagnosis not present

## 2014-03-24 DIAGNOSIS — I10 Essential (primary) hypertension: Secondary | ICD-10-CM | POA: Diagnosis not present

## 2014-03-24 DIAGNOSIS — Z5181 Encounter for therapeutic drug level monitoring: Secondary | ICD-10-CM | POA: Diagnosis not present

## 2014-03-24 DIAGNOSIS — Q213 Tetralogy of Fallot: Secondary | ICD-10-CM | POA: Diagnosis not present

## 2014-06-26 DIAGNOSIS — M1712 Unilateral primary osteoarthritis, left knee: Secondary | ICD-10-CM | POA: Diagnosis not present

## 2014-09-08 DIAGNOSIS — Q213 Tetralogy of Fallot: Secondary | ICD-10-CM | POA: Diagnosis not present

## 2014-09-08 DIAGNOSIS — I10 Essential (primary) hypertension: Secondary | ICD-10-CM | POA: Diagnosis not present

## 2014-09-08 DIAGNOSIS — J984 Other disorders of lung: Secondary | ICD-10-CM | POA: Diagnosis not present

## 2014-09-08 DIAGNOSIS — R0609 Other forms of dyspnea: Secondary | ICD-10-CM | POA: Diagnosis not present

## 2014-09-08 DIAGNOSIS — Z8679 Personal history of other diseases of the circulatory system: Secondary | ICD-10-CM | POA: Diagnosis not present

## 2014-09-17 DIAGNOSIS — I10 Essential (primary) hypertension: Secondary | ICD-10-CM | POA: Diagnosis not present

## 2014-09-17 DIAGNOSIS — Q213 Tetralogy of Fallot: Secondary | ICD-10-CM | POA: Diagnosis not present

## 2014-09-19 DIAGNOSIS — I440 Atrioventricular block, first degree: Secondary | ICD-10-CM | POA: Diagnosis not present

## 2014-09-19 DIAGNOSIS — Q213 Tetralogy of Fallot: Secondary | ICD-10-CM | POA: Diagnosis not present

## 2014-09-19 DIAGNOSIS — R000 Tachycardia, unspecified: Secondary | ICD-10-CM | POA: Diagnosis not present

## 2014-10-30 DIAGNOSIS — H02054 Trichiasis without entropian left upper eyelid: Secondary | ICD-10-CM | POA: Diagnosis not present

## 2015-01-04 DIAGNOSIS — M1712 Unilateral primary osteoarthritis, left knee: Secondary | ICD-10-CM | POA: Diagnosis not present

## 2015-01-19 DIAGNOSIS — E039 Hypothyroidism, unspecified: Secondary | ICD-10-CM | POA: Diagnosis not present

## 2015-01-19 DIAGNOSIS — E119 Type 2 diabetes mellitus without complications: Secondary | ICD-10-CM | POA: Diagnosis not present

## 2015-01-19 DIAGNOSIS — Z23 Encounter for immunization: Secondary | ICD-10-CM | POA: Diagnosis not present

## 2015-01-19 DIAGNOSIS — E0590 Thyrotoxicosis, unspecified without thyrotoxic crisis or storm: Secondary | ICD-10-CM | POA: Diagnosis not present

## 2015-01-19 DIAGNOSIS — E663 Overweight: Secondary | ICD-10-CM | POA: Diagnosis not present

## 2015-01-19 DIAGNOSIS — Z125 Encounter for screening for malignant neoplasm of prostate: Secondary | ICD-10-CM | POA: Diagnosis not present

## 2015-01-19 DIAGNOSIS — Z0000 Encounter for general adult medical examination without abnormal findings: Secondary | ICD-10-CM | POA: Diagnosis not present

## 2015-01-19 DIAGNOSIS — E785 Hyperlipidemia, unspecified: Secondary | ICD-10-CM | POA: Diagnosis not present

## 2015-01-19 DIAGNOSIS — Z6828 Body mass index (BMI) 28.0-28.9, adult: Secondary | ICD-10-CM | POA: Diagnosis not present

## 2015-01-19 DIAGNOSIS — Z1389 Encounter for screening for other disorder: Secondary | ICD-10-CM | POA: Diagnosis not present

## 2015-03-12 DIAGNOSIS — I10 Essential (primary) hypertension: Secondary | ICD-10-CM | POA: Diagnosis not present

## 2015-03-12 DIAGNOSIS — Z8679 Personal history of other diseases of the circulatory system: Secondary | ICD-10-CM | POA: Diagnosis not present

## 2015-03-12 DIAGNOSIS — Q213 Tetralogy of Fallot: Secondary | ICD-10-CM | POA: Diagnosis not present

## 2015-03-12 DIAGNOSIS — J984 Other disorders of lung: Secondary | ICD-10-CM | POA: Diagnosis not present

## 2015-03-12 DIAGNOSIS — Z9889 Other specified postprocedural states: Secondary | ICD-10-CM | POA: Diagnosis not present

## 2015-05-11 ENCOUNTER — Encounter (INDEPENDENT_AMBULATORY_CARE_PROVIDER_SITE_OTHER): Payer: Self-pay | Admitting: *Deleted

## 2015-05-11 DIAGNOSIS — M722 Plantar fascial fibromatosis: Secondary | ICD-10-CM | POA: Diagnosis not present

## 2015-05-11 DIAGNOSIS — E663 Overweight: Secondary | ICD-10-CM | POA: Diagnosis not present

## 2015-05-11 DIAGNOSIS — Z1389 Encounter for screening for other disorder: Secondary | ICD-10-CM | POA: Diagnosis not present

## 2015-05-11 DIAGNOSIS — Z6828 Body mass index (BMI) 28.0-28.9, adult: Secondary | ICD-10-CM | POA: Diagnosis not present

## 2015-05-13 ENCOUNTER — Other Ambulatory Visit (INDEPENDENT_AMBULATORY_CARE_PROVIDER_SITE_OTHER): Payer: Self-pay | Admitting: *Deleted

## 2015-05-13 DIAGNOSIS — Z1211 Encounter for screening for malignant neoplasm of colon: Secondary | ICD-10-CM

## 2015-06-18 ENCOUNTER — Encounter (INDEPENDENT_AMBULATORY_CARE_PROVIDER_SITE_OTHER): Payer: Self-pay | Admitting: *Deleted

## 2015-06-18 ENCOUNTER — Other Ambulatory Visit (INDEPENDENT_AMBULATORY_CARE_PROVIDER_SITE_OTHER): Payer: Self-pay | Admitting: *Deleted

## 2015-06-18 NOTE — Telephone Encounter (Addendum)
trilyte

## 2015-06-21 MED ORDER — PEG 3350-KCL-NA BICARB-NACL 420 G PO SOLR: 4000.0000 mL | Freq: Once | ORAL | Status: DC

## 2015-07-02 DIAGNOSIS — B029 Zoster without complications: Secondary | ICD-10-CM | POA: Diagnosis not present

## 2015-07-02 DIAGNOSIS — E663 Overweight: Secondary | ICD-10-CM | POA: Diagnosis not present

## 2015-07-02 DIAGNOSIS — Z6826 Body mass index (BMI) 26.0-26.9, adult: Secondary | ICD-10-CM | POA: Diagnosis not present

## 2015-07-02 DIAGNOSIS — Z23 Encounter for immunization: Secondary | ICD-10-CM | POA: Diagnosis not present

## 2015-07-02 DIAGNOSIS — Z1389 Encounter for screening for other disorder: Secondary | ICD-10-CM | POA: Diagnosis not present

## 2015-07-09 ENCOUNTER — Telehealth (INDEPENDENT_AMBULATORY_CARE_PROVIDER_SITE_OTHER): Payer: Self-pay | Admitting: *Deleted

## 2015-07-09 NOTE — Telephone Encounter (Signed)
Referring MD/PCP: golding   Procedure: tcs  Reason/Indication:  Screening, fam hx colon ca  Has patient had this procedure before?  no  If so, when, by whom and where?    Is there a family history of colon cancer?  Yes, grnadfather  Who?  What age when diagnosed?    Is patient diabetic?   no      Does patient have prosthetic heart valve or mechanical valve?  no  Do you have a pacemaker?  no  Has patient ever had endocarditis? no  Has patient had joint replacement within last 12 months?  no  Does patient tend to be constipated or take laxatives? no  Does patient have a history of alcohol/drug use?  no  Is patient on Coumadin, Plavix and/or Aspirin? yes  Medications: hctz 12.5 mg daily, levothyroxine 25 mg daily, calcitriol 25 mg daily, lisinopril 10 mg daily, asa 81 mg daily, ibuprofen 800 mg prn, torsemide 20 mg takes on Tuesday & Thursday, potassium 20 meq takes on Tuesday and Thursday, Tums 1000 mg tid  Allergies: pcn, codeine  Medication Adjustment: asa 2 days  Procedure date & time: 08/04/15 at 730

## 2015-07-13 NOTE — Telephone Encounter (Signed)
agree

## 2015-07-20 DIAGNOSIS — M1732 Unilateral post-traumatic osteoarthritis, left knee: Secondary | ICD-10-CM | POA: Diagnosis not present

## 2015-07-20 DIAGNOSIS — G8929 Other chronic pain: Secondary | ICD-10-CM | POA: Diagnosis not present

## 2015-07-20 DIAGNOSIS — M25562 Pain in left knee: Secondary | ICD-10-CM | POA: Diagnosis not present

## 2015-08-02 ENCOUNTER — Other Ambulatory Visit (INDEPENDENT_AMBULATORY_CARE_PROVIDER_SITE_OTHER): Payer: Self-pay | Admitting: *Deleted

## 2015-08-02 DIAGNOSIS — Z800 Family history of malignant neoplasm of digestive organs: Secondary | ICD-10-CM

## 2015-08-02 DIAGNOSIS — Z1211 Encounter for screening for malignant neoplasm of colon: Secondary | ICD-10-CM

## 2015-08-04 ENCOUNTER — Encounter (HOSPITAL_COMMUNITY)
Admission: RE | Disposition: A | Discharge status: Home / Self Care | Payer: Self-pay | Source: Ambulatory Visit | Attending: Internal Medicine

## 2015-08-04 ENCOUNTER — Ambulatory Visit (HOSPITAL_COMMUNITY)
Admission: RE | Admit: 2015-08-04 | Discharge: 2015-08-04 | Disposition: A | Discharge status: Home / Self Care | Payer: Medicare Other | Source: Ambulatory Visit | Attending: Internal Medicine | Admitting: Internal Medicine

## 2015-08-04 ENCOUNTER — Encounter (HOSPITAL_COMMUNITY): Payer: Self-pay

## 2015-08-04 DIAGNOSIS — Z7982 Long term (current) use of aspirin: Secondary | ICD-10-CM | POA: Insufficient documentation

## 2015-08-04 DIAGNOSIS — E039 Hypothyroidism, unspecified: Secondary | ICD-10-CM | POA: Diagnosis not present

## 2015-08-04 DIAGNOSIS — Z79899 Other long term (current) drug therapy: Secondary | ICD-10-CM | POA: Diagnosis not present

## 2015-08-04 DIAGNOSIS — K5730 Diverticulosis of large intestine without perforation or abscess without bleeding: Secondary | ICD-10-CM | POA: Insufficient documentation

## 2015-08-04 DIAGNOSIS — Z1211 Encounter for screening for malignant neoplasm of colon: Secondary | ICD-10-CM | POA: Diagnosis not present

## 2015-08-04 DIAGNOSIS — Z951 Presence of aortocoronary bypass graft: Secondary | ICD-10-CM | POA: Insufficient documentation

## 2015-08-04 DIAGNOSIS — Z952 Presence of prosthetic heart valve: Secondary | ICD-10-CM | POA: Diagnosis not present

## 2015-08-04 DIAGNOSIS — I10 Essential (primary) hypertension: Secondary | ICD-10-CM | POA: Diagnosis not present

## 2015-08-04 DIAGNOSIS — Z800 Family history of malignant neoplasm of digestive organs: Secondary | ICD-10-CM

## 2015-08-04 DIAGNOSIS — D122 Benign neoplasm of ascending colon: Secondary | ICD-10-CM | POA: Diagnosis not present

## 2015-08-04 DIAGNOSIS — M1991 Primary osteoarthritis, unspecified site: Secondary | ICD-10-CM | POA: Diagnosis not present

## 2015-08-04 HISTORY — DX: Unspecified osteoarthritis, unspecified site: M19.90

## 2015-08-04 HISTORY — DX: Hypothyroidism, unspecified: E03.9

## 2015-08-04 HISTORY — DX: Essential (primary) hypertension: I10

## 2015-08-04 HISTORY — PX: COLONOSCOPY: SHX5424

## 2015-08-04 SURGERY — COLONOSCOPY
Anesthesia: Moderate Sedation

## 2015-08-04 MED ORDER — SODIUM CHLORIDE 0.9 % IV SOLN
INTRAVENOUS | Status: DC
  Administered 2015-08-04: 07:00:00 via INTRAVENOUS

## 2015-08-04 MED ORDER — SODIUM CHLORIDE 0.9 % IV SOLN: INTRAVENOUS | Status: DC

## 2015-08-04 MED ORDER — MIDAZOLAM HCL 5 MG/5ML IJ SOLN
INTRAMUSCULAR | Status: AC
  Filled 2015-08-04: qty 10

## 2015-08-04 MED ORDER — MEPERIDINE HCL 50 MG/ML IJ SOLN
INTRAMUSCULAR | Status: AC
  Filled 2015-08-04: qty 1

## 2015-08-04 MED ORDER — MEPERIDINE HCL 50 MG/ML IJ SOLN
INTRAMUSCULAR | Status: DC | PRN
  Administered 2015-08-04 (×2): 25 mg via INTRAVENOUS

## 2015-08-04 MED ORDER — STERILE WATER FOR IRRIGATION IR SOLN
Status: DC | PRN
  Administered 2015-08-04: 08:00:00

## 2015-08-04 MED ORDER — SIMETHICONE 40 MG/0.6ML PO SUSP
ORAL | Status: AC
  Filled 2015-08-04: qty 30

## 2015-08-04 MED ORDER — MIDAZOLAM HCL 5 MG/5ML IJ SOLN
INTRAMUSCULAR | Status: DC | PRN
  Administered 2015-08-04 (×3): 2 mg via INTRAVENOUS

## 2015-08-04 NOTE — Op Note (Signed)
[facility] Patient Name: Jay Skinner Procedure Date: 08/04/2015 7:32 AM MRN: [id number] Date of Birth: 25-Aug-1960 Attending MD: Hildred Laser , MD CSN: [id number] Age: 55 Admit Type: Outpatient Procedure:                Colonoscopy Indications:              Screening for colorectal malignant neoplasm Providers:                Hildred Laser, MD, Rosina Lowenstein, RN, Isabella Stalling, Technician Referring MD:             Halford Chessman, MD Medicines:                Meperidine 50 mg IV, Midazolam 6 mg IV Complications:            No immediate complications. Estimated Blood Loss:     Estimated blood loss was minimal. Procedure:                Pre-Anesthesia Assessment:                           - Prior to the procedure, a History and Physical                            was performed, and patient medications and                            allergies were reviewed. The patient's tolerance of                            previous anesthesia was also reviewed. The risks                            and benefits of the procedure and the sedation                            options and risks were discussed with the patient.                            All questions were answered, and informed consent                            was obtained. Prior Anticoagulants: The patient                            last took aspirin 1 day prior to the procedure. ASA                            Grade Assessment: III - A patient with severe                            systemic disease. After reviewing the risks and  benefits, the patient was deemed in satisfactory                            condition to undergo the procedure.                           After obtaining informed consent, the colonoscope                            was passed under direct vision. Throughout the                            procedure, the patient's blood pressure, pulse, and                  oxygen saturations were monitored continuously. The                            [id number]) scope was introduced through                            the anus and advanced to the the cecum, identified                            by appendiceal orifice and ileocecal valve. The                            colonoscopy was performed without difficulty. The                            patient tolerated the procedure well. The quality                            of the bowel preparation was good. The ileocecal                            valve, appendiceal orifice, and rectum were                            photographed. Scope In: [id number] Scope Out: 8:07:15 AM Scope Withdrawal Time: 0 hours 7 minutes 30 seconds  Total Procedure Duration: 0 hours 20 minutes 32 seconds  Findings:      Two sessile polyps were found in the ascending colon. The polyps were 4       to 6 mm in size. These polyps were removed with a cold snare. Resection       and retrieval were complete.      Multiple medium-mouthed diverticula were found in the sigmoid colon.      The retroflexed view of the distal rectum and anal verge was normal and       showed no anal or rectal abnormalities. Impression:               - Two 4 to 6 mm polyps in the ascending colon,  removed with a cold snare. Resected and retrieved.                           - Diverticulosis in the sigmoid colon. Moderate Sedation:      Moderate (conscious) sedation was administered by the endoscopy nurse       and supervised by the endoscopist. The following parameters were       monitored: oxygen saturation, heart rate, blood pressure, CO2       capnography and response to care. Total physician intraservice time was       27 minutes. Recommendation:           - Patient has a contact number available for                            emergencies. The signs and symptoms of potential                            delayed  complications were discussed with the                            patient. Return to normal activities tomorrow.                            Written discharge instructions were provided to the                            patient.                           - High fiber diet today.                           - Continue present medications.                           - Resume aspirin at prior dose tomorrow.                           - Await pathology results.                           - Repeat colonoscopy for surveillance based on                            pathology results. Procedure Code(s):        --- Professional ---                           [phone], Colonoscopy, flexible; with removal of                            tumor(s), polyp(s), or other lesion(s) by snare                            technique  [id number], Moderate sedation services provided by the                            same physician or other qualified health care                            professional performing the diagnostic or                            therapeutic service that the sedation supports,                            requiring the presence of an independent trained                            observer to assist in the monitoring of the                            patient's level of consciousness and physiological                            status; initial 15 minutes of intraservice time,                            patient age 87 years or older                           [phone], Moderate sedation services; each additional                            15 minutes intraservice time Diagnosis Code(s):        --- Professional ---                           Z12.11, Encounter for screening for malignant                            neoplasm of colon                           D12.2, Benign neoplasm of ascending colon                           K57.30, Diverticulosis of large intestine without                             perforation or abscess without bleeding CPT copyright 2016 [employer]. All rights reserved. The codes documented in this report are preliminary and upon coder review may  be revised to meet current compliance requirements. Hildred Laser, MD Hildred Laser, MD 08/04/2015 8:15:15 AM This report has been signed electronically. Number of Addenda: 0

## 2015-08-04 NOTE — Discharge Instructions (Signed)
Resume aspirin on 08/05/2015. Resume other medications as before. High fiber diet. No driving for 24 hours. Physician will call with biopsy results.  Colonoscopy, Care After Refer to this sheet in the next few weeks. These instructions provide you with information on caring for yourself after your procedure. Your health care provider may also give you more specific instructions. Your treatment has been planned according to current medical practices, but problems sometimes occur. Call your health care provider if you have any problems or questions after your procedure. WHAT TO EXPECT AFTER THE PROCEDURE  After your procedure, it is typical to have the following:  A small amount of blood in your stool.  Moderate amounts of gas and mild abdominal cramping or bloating. HOME CARE INSTRUCTIONS  Do not drive, operate machinery, or sign important documents for 24 hours.  You may shower and resume your regular physical activities, but move at a slower pace for the first 24 hours.  Take frequent rest periods for the first 24 hours.  Walk around or put a warm pack on your abdomen to help reduce abdominal cramping and bloating.  Drink enough fluids to keep your urine clear or pale yellow.  You may resume your normal diet as instructed by your health care provider. Avoid heavy or fried foods that are hard to digest.  Avoid drinking alcohol for 24 hours or as instructed by your health care provider.  Only take over-the-counter or prescription medicines as directed by your health care provider.  If a tissue sample (biopsy) was taken during your procedure:  Do not take aspirin or blood thinners for 7 days, or as instructed by your health care provider.  Do not drink alcohol for 7 days, or as instructed by your health care provider.  Eat soft foods for the first 24 hours. SEEK MEDICAL CARE IF: You have persistent spotting of blood in your stool 2-3 days after the procedure. SEEK IMMEDIATE  MEDICAL CARE IF:  You have more than a small spotting of blood in your stool.  You pass large blood clots in your stool.  Your abdomen is swollen (distended).  You have nausea or vomiting.  You have a fever.  You have increasing abdominal pain that is not relieved with medicine.   This information is not intended to replace advice given to you by your health care provider. Make sure you discuss any questions you have with your health care provider.   Diverticulosis Diverticulosis is the condition that develops when small pouches (diverticula) form in the wall of your colon. Your colon, or large intestine, is where water is absorbed and stool is formed. The pouches form when the inside layer of your colon pushes through weak spots in the outer layers of your colon. CAUSES  No one knows exactly what causes diverticulosis. RISK FACTORS  Being older than 51. Your risk for this condition increases with age. Diverticulosis is rare in people younger than 40 years. By age 97, almost everyone has it.  Eating a low-fiber diet.  Being frequently constipated.  Being overweight.  Not getting enough exercise.  Smoking.  Taking over-the-counter pain medicines, like aspirin and ibuprofen. SYMPTOMS  Most people with diverticulosis do not have symptoms. DIAGNOSIS  Because diverticulosis often has no symptoms, health care providers often discover the condition during an exam for other colon problems. In many cases, a health care provider will diagnose diverticulosis while using a flexible scope to examine the colon (colonoscopy). TREATMENT  If you have never developed an  infection related to diverticulosis, you may not need treatment. If you have had an infection before, treatment may include:  Eating more fruits, vegetables, and grains.  Taking a fiber supplement.  Taking a live bacteria supplement (probiotic).  Taking medicine to relax your colon. HOME CARE INSTRUCTIONS   Drink at  least 6-8 glasses of water each day to prevent constipation.  Try not to strain when you have a bowel movement.  Keep all follow-up appointments. If you have had an infection before:  Increase the fiber in your diet as directed by your health care provider or dietitian.  Take a dietary fiber supplement if your health care provider approves.  Only take medicines as directed by your health care provider. SEEK MEDICAL CARE IF:   You have abdominal pain.  You have bloating.  You have cramps.  You have not gone to the bathroom in 3 days. SEEK IMMEDIATE MEDICAL CARE IF:   Your pain gets worse.  Yourbloating becomes very bad.  You have a fever or chills, and your symptoms suddenly get worse.  You begin vomiting.  You have bowel movements that are bloody or black. MAKE SURE YOU:  Understand these instructions.  Will watch your condition.  Will get help right away if you are not doing well or get worse.   This information is not intended to replace advice given to you by your health care provider. Make sure you discuss any questions you have with your health care provider.   Colon Polyps Polyps are lumps of extra tissue growing inside the body. Polyps can grow in the large intestine (colon). Most colon polyps are noncancerous (benign). However, some colon polyps can become cancerous over time. Polyps that are larger than a pea may be harmful. To be safe, caregivers remove and test all polyps. CAUSES  Polyps form when mutations in the genes cause your cells to grow and divide even though no more tissue is needed. RISK FACTORS There are a number of risk factors that can increase your chances of getting colon polyps. They include:  Being older than 50 years.  Family history of colon polyps or colon cancer.  Long-term colon diseases, such as colitis or Crohn disease.  Being overweight.  Smoking.  Being inactive.  Drinking too much alcohol. SYMPTOMS  Most small  polyps do not cause symptoms. If symptoms are present, they may include:  Blood in the stool. The stool may look dark red or black.  Constipation or diarrhea that lasts longer than 1 week. DIAGNOSIS People often do not know they have polyps until their caregiver finds them during a regular checkup. Your caregiver can use 4 tests to check for polyps:  Digital rectal exam. The caregiver wears gloves and feels inside the rectum. This test would find polyps only in the rectum.  Barium enema. The caregiver puts a liquid called barium into your rectum before taking X-rays of your colon. Barium makes your colon look white. Polyps are dark, so they are easy to see in the X-ray pictures.  Sigmoidoscopy. A thin, flexible tube (sigmoidoscope) is placed into your rectum. The sigmoidoscope has a light and tiny camera in it. The caregiver uses the sigmoidoscope to look at the last third of your colon.  Colonoscopy. This test is like sigmoidoscopy, but the caregiver looks at the entire colon. This is the most common method for finding and removing polyps. TREATMENT  Any polyps will be removed during a sigmoidoscopy or colonoscopy. The polyps are then tested for  cancer. PREVENTION  To help lower your risk of getting more colon polyps:  Eat plenty of fruits and vegetables. Avoid eating fatty foods.  Do not smoke.  Avoid drinking alcohol.  Exercise every day.  Lose weight if recommended by your caregiver.  Eat plenty of calcium and folate. Foods that are rich in calcium include milk, cheese, and broccoli. Foods that are rich in folate include chickpeas, kidney beans, and spinach. HOME CARE INSTRUCTIONS Keep all follow-up appointments as directed by your caregiver. You may need periodic exams to check for polyps. SEEK MEDICAL CARE IF: You notice bleeding during a bowel movement.   This information is not intended to replace advice given to you by your health care provider. Make sure you discuss any  questions you have with your health care provider.

## 2015-08-04 NOTE — H&P (Signed)
Jay Skinner is an 55 y.o. male.   Chief Complaint: Patient is here for colonoscopy. HPI: Patient is 55 year old Caucasian male who is here for screening colonoscopy. He denies abdominal pain change in bowel habits or rectal bleeding. Family history significant for CRC in maternal grandfather late onset.  Past Medical History  Diagnosis Date  . Hypertension   . Hypothyroidism   . Arthritis     Past Surgical History  Procedure Laterality Date  . Coronary artery bypass graft    . Cardiac valve replacementIn 1998. [facility] [place]   . [facility] Left     2008 , [facility]    Family History  Problem Relation Age of Onset  . Colon cancer Other    Social History:  reports that he has never smoked. He does not have any smokeless tobacco history on file. He reports that he does not drink alcohol or use illicit drugs.  Allergies:  Allergies  Allergen Reactions  . Codeine Hives  . Lasix [Furosemide] Hives  . Penicillins Dermatitis    Medications Prior to Admission  Medication Sig Dispense Refill  . aspirin EC 81 MG tablet Take 81 mg by mouth daily.    . calcitRIOL (ROCALTROL) 0.25 MCG capsule Take 0.25 mcg by mouth daily.    . calcium carbonate (TUMS - DOSED IN MG ELEMENTAL CALCIUM) 500 MG chewable tablet Chew 3 tablets by mouth daily.    . hydrochlorothiazide (MICROZIDE) 12.5 MG capsule Take 12.5 mg by mouth daily.    Marland Kitchen levothyroxine (SYNTHROID, LEVOTHROID) 25 MCG tablet Take 25 mcg by mouth daily before breakfast.    . lisinopril (PRINIVIL,ZESTRIL) 10 MG tablet Take 10 mg by mouth daily.    . polyethylene glycol-electrolytes (NULYTELY/GOLYTELY) 420 g solution Take 4,000 mLs by mouth once. 4000 mL 0  . potassium chloride SA (K-DUR,KLOR-CON) 20 MEQ tablet Take 1 tablet by mouth daily.     Marland Kitchen torsemide (DEMADEX) 20 MG tablet Take 20 mg by mouth every other day.      No results found for this or any previous visit (from the past 48 hour(s)). No results  found.  ROS  Blood pressure 110/59, pulse 66, temperature 97.1 F (36.2 C), temperature source Oral, resp. rate 20, height 5\' 4"  (1.626 m), weight 146 lb (66.225 kg), SpO2 98 %. Physical Exam  Constitutional: He appears well-developed and well-nourished.  HENT:  Mouth/Throat: Oropharynx is clear and moist.  Eyes: Conjunctivae are normal. No scleral icterus.  Neck: No thyromegaly present.  Cardiovascular:  Regular rhythm normal S1 and split S2. Grade 3/6 holosystolic murmur best heard at left sternal border.  Respiratory: Effort normal and breath sounds normal.  GI: Soft. He exhibits no distension and no mass. There is no tenderness.  Musculoskeletal: He exhibits no edema.  Lymphadenopathy:    He has no cervical adenopathy.  Neurological: He is alert.  Skin: Skin is warm and dry.     Assessment/Plan Average risk screening colonoscopy.  Rogene Houston, MD 08/04/2015, 7:35 AM

## 2015-08-06 ENCOUNTER — Encounter (HOSPITAL_COMMUNITY): Payer: Self-pay | Admitting: Internal Medicine

## 2015-10-19 DIAGNOSIS — R931 Abnormal findings on diagnostic imaging of heart and coronary circulation: Secondary | ICD-10-CM | POA: Diagnosis not present

## 2015-10-19 DIAGNOSIS — Z7982 Long term (current) use of aspirin: Secondary | ICD-10-CM | POA: Diagnosis not present

## 2015-10-19 DIAGNOSIS — I340 Nonrheumatic mitral (valve) insufficiency: Secondary | ICD-10-CM | POA: Diagnosis not present

## 2015-10-19 DIAGNOSIS — I361 Nonrheumatic tricuspid (valve) insufficiency: Secondary | ICD-10-CM | POA: Diagnosis not present

## 2015-10-19 DIAGNOSIS — I10 Essential (primary) hypertension: Secondary | ICD-10-CM | POA: Diagnosis not present

## 2015-10-19 DIAGNOSIS — E7800 Pure hypercholesterolemia, unspecified: Secondary | ICD-10-CM | POA: Diagnosis not present

## 2015-10-19 DIAGNOSIS — I2510 Atherosclerotic heart disease of native coronary artery without angina pectoris: Secondary | ICD-10-CM | POA: Diagnosis not present

## 2015-10-19 DIAGNOSIS — Q213 Tetralogy of Fallot: Secondary | ICD-10-CM | POA: Diagnosis not present

## 2015-10-19 DIAGNOSIS — I517 Cardiomegaly: Secondary | ICD-10-CM | POA: Diagnosis not present

## 2015-10-19 DIAGNOSIS — Z79899 Other long term (current) drug therapy: Secondary | ICD-10-CM | POA: Diagnosis not present

## 2015-10-19 DIAGNOSIS — E079 Disorder of thyroid, unspecified: Secondary | ICD-10-CM | POA: Diagnosis not present

## 2015-10-19 DIAGNOSIS — Z954 Presence of other heart-valve replacement: Secondary | ICD-10-CM | POA: Diagnosis not present

## 2015-10-19 DIAGNOSIS — E785 Hyperlipidemia, unspecified: Secondary | ICD-10-CM | POA: Diagnosis not present

## 2015-10-19 DIAGNOSIS — Z8774 Personal history of (corrected) congenital malformations of heart and circulatory system: Secondary | ICD-10-CM | POA: Diagnosis not present

## 2015-10-19 DIAGNOSIS — Z09 Encounter for follow-up examination after completed treatment for conditions other than malignant neoplasm: Secondary | ICD-10-CM | POA: Diagnosis not present

## 2016-01-20 DIAGNOSIS — Z0000 Encounter for general adult medical examination without abnormal findings: Secondary | ICD-10-CM | POA: Diagnosis not present

## 2016-01-20 DIAGNOSIS — E785 Hyperlipidemia, unspecified: Secondary | ICD-10-CM | POA: Diagnosis not present

## 2016-01-20 DIAGNOSIS — Z23 Encounter for immunization: Secondary | ICD-10-CM | POA: Diagnosis not present

## 2016-01-20 DIAGNOSIS — Z1389 Encounter for screening for other disorder: Secondary | ICD-10-CM | POA: Diagnosis not present

## 2016-01-20 DIAGNOSIS — E119 Type 2 diabetes mellitus without complications: Secondary | ICD-10-CM | POA: Diagnosis not present

## 2016-01-20 DIAGNOSIS — E039 Hypothyroidism, unspecified: Secondary | ICD-10-CM | POA: Diagnosis not present

## 2016-01-20 DIAGNOSIS — I10 Essential (primary) hypertension: Secondary | ICD-10-CM | POA: Diagnosis not present

## 2016-01-20 DIAGNOSIS — Z6826 Body mass index (BMI) 26.0-26.9, adult: Secondary | ICD-10-CM | POA: Diagnosis not present

## 2016-01-20 DIAGNOSIS — Z125 Encounter for screening for malignant neoplasm of prostate: Secondary | ICD-10-CM | POA: Diagnosis not present

## 2016-01-20 DIAGNOSIS — R7309 Other abnormal glucose: Secondary | ICD-10-CM | POA: Diagnosis not present

## 2016-03-23 DIAGNOSIS — M1712 Unilateral primary osteoarthritis, left knee: Secondary | ICD-10-CM | POA: Diagnosis not present

## 2016-04-25 DIAGNOSIS — Z8679 Personal history of other diseases of the circulatory system: Secondary | ICD-10-CM | POA: Diagnosis not present

## 2016-04-25 DIAGNOSIS — Q213 Tetralogy of Fallot: Secondary | ICD-10-CM | POA: Diagnosis not present

## 2016-04-25 DIAGNOSIS — I10 Essential (primary) hypertension: Secondary | ICD-10-CM | POA: Diagnosis not present

## 2016-10-24 DIAGNOSIS — J984 Other disorders of lung: Secondary | ICD-10-CM | POA: Diagnosis not present

## 2016-10-24 DIAGNOSIS — I10 Essential (primary) hypertension: Secondary | ICD-10-CM | POA: Diagnosis not present

## 2016-10-24 DIAGNOSIS — Z9889 Other specified postprocedural states: Secondary | ICD-10-CM | POA: Diagnosis not present

## 2016-10-24 DIAGNOSIS — Q213 Tetralogy of Fallot: Secondary | ICD-10-CM | POA: Diagnosis not present

## 2016-10-24 DIAGNOSIS — Z8679 Personal history of other diseases of the circulatory system: Secondary | ICD-10-CM | POA: Diagnosis not present

## 2017-01-11 DIAGNOSIS — Z0000 Encounter for general adult medical examination without abnormal findings: Secondary | ICD-10-CM | POA: Diagnosis not present

## 2017-01-23 DIAGNOSIS — R7309 Other abnormal glucose: Secondary | ICD-10-CM | POA: Diagnosis not present

## 2017-01-23 DIAGNOSIS — Z23 Encounter for immunization: Secondary | ICD-10-CM | POA: Diagnosis not present

## 2017-01-23 DIAGNOSIS — E538 Deficiency of other specified B group vitamins: Secondary | ICD-10-CM | POA: Diagnosis not present

## 2017-01-23 DIAGNOSIS — Z6827 Body mass index (BMI) 27.0-27.9, adult: Secondary | ICD-10-CM | POA: Diagnosis not present

## 2017-01-23 DIAGNOSIS — Z1389 Encounter for screening for other disorder: Secondary | ICD-10-CM | POA: Diagnosis not present

## 2017-01-23 DIAGNOSIS — D649 Anemia, unspecified: Secondary | ICD-10-CM | POA: Diagnosis not present

## 2017-01-23 DIAGNOSIS — E663 Overweight: Secondary | ICD-10-CM | POA: Diagnosis not present

## 2017-01-23 DIAGNOSIS — Z0000 Encounter for general adult medical examination without abnormal findings: Secondary | ICD-10-CM | POA: Diagnosis not present

## 2017-01-23 DIAGNOSIS — I10 Essential (primary) hypertension: Secondary | ICD-10-CM | POA: Diagnosis not present

## 2017-01-23 DIAGNOSIS — Q213 Tetralogy of Fallot: Secondary | ICD-10-CM | POA: Diagnosis not present

## 2017-01-23 DIAGNOSIS — Z125 Encounter for screening for malignant neoplasm of prostate: Secondary | ICD-10-CM | POA: Diagnosis not present

## 2017-01-30 DIAGNOSIS — Z1211 Encounter for screening for malignant neoplasm of colon: Secondary | ICD-10-CM | POA: Diagnosis not present

## 2017-03-27 DIAGNOSIS — M1732 Unilateral post-traumatic osteoarthritis, left knee: Secondary | ICD-10-CM | POA: Diagnosis not present

## 2017-10-01 DIAGNOSIS — E039 Hypothyroidism, unspecified: Secondary | ICD-10-CM | POA: Diagnosis not present

## 2017-10-01 DIAGNOSIS — Z1389 Encounter for screening for other disorder: Secondary | ICD-10-CM | POA: Diagnosis not present

## 2017-10-01 DIAGNOSIS — Z6829 Body mass index (BMI) 29.0-29.9, adult: Secondary | ICD-10-CM | POA: Diagnosis not present

## 2017-10-01 DIAGNOSIS — Q213 Tetralogy of Fallot: Secondary | ICD-10-CM | POA: Diagnosis not present

## 2017-11-06 DIAGNOSIS — I481 Persistent atrial fibrillation: Secondary | ICD-10-CM | POA: Diagnosis not present

## 2017-11-06 DIAGNOSIS — Q213 Tetralogy of Fallot: Secondary | ICD-10-CM | POA: Diagnosis not present

## 2017-11-06 DIAGNOSIS — Z8679 Personal history of other diseases of the circulatory system: Secondary | ICD-10-CM | POA: Diagnosis not present

## 2017-11-06 DIAGNOSIS — I4819 Other persistent atrial fibrillation: Secondary | ICD-10-CM | POA: Insufficient documentation

## 2017-11-06 DIAGNOSIS — Z9889 Other specified postprocedural states: Secondary | ICD-10-CM | POA: Diagnosis not present

## 2017-11-06 DIAGNOSIS — J984 Other disorders of lung: Secondary | ICD-10-CM | POA: Diagnosis not present

## 2017-12-07 DIAGNOSIS — I4581 Long QT syndrome: Secondary | ICD-10-CM | POA: Diagnosis not present

## 2017-12-07 DIAGNOSIS — J45909 Unspecified asthma, uncomplicated: Secondary | ICD-10-CM | POA: Diagnosis not present

## 2017-12-07 DIAGNOSIS — I481 Persistent atrial fibrillation: Secondary | ICD-10-CM | POA: Diagnosis not present

## 2017-12-07 DIAGNOSIS — I4510 Unspecified right bundle-branch block: Secondary | ICD-10-CM | POA: Diagnosis not present

## 2018-01-08 DIAGNOSIS — I4819 Other persistent atrial fibrillation: Secondary | ICD-10-CM | POA: Diagnosis not present

## 2018-01-08 DIAGNOSIS — Q213 Tetralogy of Fallot: Secondary | ICD-10-CM | POA: Diagnosis not present

## 2018-01-08 DIAGNOSIS — Z23 Encounter for immunization: Secondary | ICD-10-CM | POA: Diagnosis not present

## 2018-02-07 DIAGNOSIS — I10 Essential (primary) hypertension: Secondary | ICD-10-CM | POA: Diagnosis not present

## 2018-02-07 DIAGNOSIS — Z0001 Encounter for general adult medical examination with abnormal findings: Secondary | ICD-10-CM | POA: Diagnosis not present

## 2018-02-07 DIAGNOSIS — R7309 Other abnormal glucose: Secondary | ICD-10-CM | POA: Diagnosis not present

## 2018-02-07 DIAGNOSIS — R946 Abnormal results of thyroid function studies: Secondary | ICD-10-CM | POA: Diagnosis not present

## 2018-02-07 DIAGNOSIS — E782 Mixed hyperlipidemia: Secondary | ICD-10-CM | POA: Diagnosis not present

## 2018-02-07 DIAGNOSIS — Q213 Tetralogy of Fallot: Secondary | ICD-10-CM | POA: Diagnosis not present

## 2018-02-07 DIAGNOSIS — Z6829 Body mass index (BMI) 29.0-29.9, adult: Secondary | ICD-10-CM | POA: Diagnosis not present

## 2018-02-07 DIAGNOSIS — Z125 Encounter for screening for malignant neoplasm of prostate: Secondary | ICD-10-CM | POA: Diagnosis not present

## 2018-02-07 DIAGNOSIS — Z1389 Encounter for screening for other disorder: Secondary | ICD-10-CM | POA: Diagnosis not present

## 2018-02-07 DIAGNOSIS — E663 Overweight: Secondary | ICD-10-CM | POA: Diagnosis not present

## 2018-02-07 DIAGNOSIS — J45909 Unspecified asthma, uncomplicated: Secondary | ICD-10-CM | POA: Diagnosis not present

## 2018-02-07 DIAGNOSIS — E039 Hypothyroidism, unspecified: Secondary | ICD-10-CM | POA: Diagnosis not present

## 2018-03-28 DIAGNOSIS — M1730 Unilateral post-traumatic osteoarthritis, unspecified knee: Secondary | ICD-10-CM | POA: Diagnosis not present

## 2018-03-28 DIAGNOSIS — M25461 Effusion, right knee: Secondary | ICD-10-CM | POA: Diagnosis not present

## 2018-03-28 DIAGNOSIS — M1712 Unilateral primary osteoarthritis, left knee: Secondary | ICD-10-CM | POA: Diagnosis not present

## 2018-03-28 DIAGNOSIS — M170 Bilateral primary osteoarthritis of knee: Secondary | ICD-10-CM | POA: Diagnosis not present

## 2018-04-02 DIAGNOSIS — M25462 Effusion, left knee: Secondary | ICD-10-CM | POA: Diagnosis not present

## 2018-11-05 DIAGNOSIS — I370 Nonrheumatic pulmonary valve stenosis: Secondary | ICD-10-CM | POA: Diagnosis not present

## 2018-11-05 DIAGNOSIS — J984 Other disorders of lung: Secondary | ICD-10-CM | POA: Diagnosis not present

## 2018-11-05 DIAGNOSIS — I4819 Other persistent atrial fibrillation: Secondary | ICD-10-CM | POA: Diagnosis not present

## 2018-11-05 DIAGNOSIS — Q249 Congenital malformation of heart, unspecified: Secondary | ICD-10-CM | POA: Diagnosis not present

## 2018-11-05 DIAGNOSIS — Z8774 Personal history of (corrected) congenital malformations of heart and circulatory system: Secondary | ICD-10-CM | POA: Diagnosis not present

## 2018-11-05 DIAGNOSIS — Q213 Tetralogy of Fallot: Secondary | ICD-10-CM | POA: Diagnosis not present

## 2018-11-05 DIAGNOSIS — I50812 Chronic right heart failure: Secondary | ICD-10-CM | POA: Diagnosis not present

## 2018-11-05 DIAGNOSIS — I517 Cardiomegaly: Secondary | ICD-10-CM | POA: Diagnosis not present

## 2018-11-05 DIAGNOSIS — R0600 Dyspnea, unspecified: Secondary | ICD-10-CM | POA: Diagnosis not present

## 2018-11-05 DIAGNOSIS — I371 Nonrheumatic pulmonary valve insufficiency: Secondary | ICD-10-CM | POA: Diagnosis not present

## 2018-11-05 DIAGNOSIS — I472 Ventricular tachycardia: Secondary | ICD-10-CM | POA: Diagnosis not present

## 2018-12-26 DIAGNOSIS — Z23 Encounter for immunization: Secondary | ICD-10-CM | POA: Diagnosis not present

## 2018-12-30 DIAGNOSIS — L03213 Periorbital cellulitis: Secondary | ICD-10-CM | POA: Diagnosis not present

## 2018-12-30 DIAGNOSIS — E6609 Other obesity due to excess calories: Secondary | ICD-10-CM | POA: Diagnosis not present

## 2018-12-30 DIAGNOSIS — Z6831 Body mass index (BMI) 31.0-31.9, adult: Secondary | ICD-10-CM | POA: Diagnosis not present

## 2018-12-30 DIAGNOSIS — Z1389 Encounter for screening for other disorder: Secondary | ICD-10-CM | POA: Diagnosis not present

## 2018-12-31 DIAGNOSIS — Z952 Presence of prosthetic heart valve: Secondary | ICD-10-CM | POA: Diagnosis not present

## 2018-12-31 DIAGNOSIS — J984 Other disorders of lung: Secondary | ICD-10-CM | POA: Diagnosis not present

## 2018-12-31 DIAGNOSIS — R0600 Dyspnea, unspecified: Secondary | ICD-10-CM | POA: Diagnosis not present

## 2018-12-31 DIAGNOSIS — Q249 Congenital malformation of heart, unspecified: Secondary | ICD-10-CM | POA: Diagnosis not present

## 2018-12-31 DIAGNOSIS — R9431 Abnormal electrocardiogram [ECG] [EKG]: Secondary | ICD-10-CM | POA: Diagnosis not present

## 2018-12-31 DIAGNOSIS — I472 Ventricular tachycardia: Secondary | ICD-10-CM | POA: Diagnosis not present

## 2018-12-31 DIAGNOSIS — I088 Other rheumatic multiple valve diseases: Secondary | ICD-10-CM | POA: Diagnosis not present

## 2018-12-31 DIAGNOSIS — R000 Tachycardia, unspecified: Secondary | ICD-10-CM | POA: Diagnosis not present

## 2018-12-31 DIAGNOSIS — Q213 Tetralogy of Fallot: Secondary | ICD-10-CM | POA: Diagnosis not present

## 2018-12-31 DIAGNOSIS — I370 Nonrheumatic pulmonary valve stenosis: Secondary | ICD-10-CM | POA: Diagnosis not present

## 2018-12-31 DIAGNOSIS — I4510 Unspecified right bundle-branch block: Secondary | ICD-10-CM | POA: Diagnosis not present

## 2018-12-31 DIAGNOSIS — Z8774 Personal history of (corrected) congenital malformations of heart and circulatory system: Secondary | ICD-10-CM | POA: Diagnosis not present

## 2018-12-31 DIAGNOSIS — I50812 Chronic right heart failure: Secondary | ICD-10-CM | POA: Diagnosis not present

## 2019-01-06 DIAGNOSIS — Z6832 Body mass index (BMI) 32.0-32.9, adult: Secondary | ICD-10-CM | POA: Diagnosis not present

## 2019-01-06 DIAGNOSIS — E6609 Other obesity due to excess calories: Secondary | ICD-10-CM | POA: Diagnosis not present

## 2019-01-06 DIAGNOSIS — M25572 Pain in left ankle and joints of left foot: Secondary | ICD-10-CM | POA: Diagnosis not present

## 2019-01-06 DIAGNOSIS — M7989 Other specified soft tissue disorders: Secondary | ICD-10-CM | POA: Diagnosis not present

## 2019-01-07 DIAGNOSIS — I4819 Other persistent atrial fibrillation: Secondary | ICD-10-CM | POA: Diagnosis not present

## 2019-01-07 DIAGNOSIS — I10 Essential (primary) hypertension: Secondary | ICD-10-CM | POA: Diagnosis not present

## 2019-01-07 DIAGNOSIS — Q213 Tetralogy of Fallot: Secondary | ICD-10-CM | POA: Diagnosis not present

## 2019-01-07 DIAGNOSIS — M19072 Primary osteoarthritis, left ankle and foot: Secondary | ICD-10-CM | POA: Diagnosis not present

## 2019-01-07 DIAGNOSIS — M25572 Pain in left ankle and joints of left foot: Secondary | ICD-10-CM | POA: Diagnosis not present

## 2019-01-07 DIAGNOSIS — Q661 Congenital talipes calcaneovarus, unspecified foot: Secondary | ICD-10-CM | POA: Diagnosis not present

## 2019-01-07 DIAGNOSIS — M79672 Pain in left foot: Secondary | ICD-10-CM | POA: Diagnosis not present

## 2019-01-07 DIAGNOSIS — R600 Localized edema: Secondary | ICD-10-CM | POA: Insufficient documentation

## 2019-01-07 DIAGNOSIS — R2242 Localized swelling, mass and lump, left lower limb: Secondary | ICD-10-CM | POA: Diagnosis not present

## 2019-01-22 DIAGNOSIS — Q661 Congenital talipes calcaneovarus, unspecified foot: Secondary | ICD-10-CM | POA: Diagnosis not present

## 2019-01-22 DIAGNOSIS — Q6612 Congenital talipes calcaneovarus, left foot: Secondary | ICD-10-CM | POA: Diagnosis not present

## 2019-01-22 DIAGNOSIS — M25572 Pain in left ankle and joints of left foot: Secondary | ICD-10-CM | POA: Diagnosis not present

## 2019-01-22 DIAGNOSIS — G8929 Other chronic pain: Secondary | ICD-10-CM | POA: Diagnosis not present

## 2019-01-22 DIAGNOSIS — M7989 Other specified soft tissue disorders: Secondary | ICD-10-CM | POA: Diagnosis not present

## 2019-02-17 DIAGNOSIS — N182 Chronic kidney disease, stage 2 (mild): Secondary | ICD-10-CM | POA: Diagnosis not present

## 2019-02-17 DIAGNOSIS — Z6832 Body mass index (BMI) 32.0-32.9, adult: Secondary | ICD-10-CM | POA: Diagnosis not present

## 2019-02-17 DIAGNOSIS — Z1389 Encounter for screening for other disorder: Secondary | ICD-10-CM | POA: Diagnosis not present

## 2019-02-17 DIAGNOSIS — I10 Essential (primary) hypertension: Secondary | ICD-10-CM | POA: Diagnosis not present

## 2019-02-17 DIAGNOSIS — Z0001 Encounter for general adult medical examination with abnormal findings: Secondary | ICD-10-CM | POA: Diagnosis not present

## 2019-02-17 DIAGNOSIS — Q213 Tetralogy of Fallot: Secondary | ICD-10-CM | POA: Diagnosis not present

## 2019-02-17 DIAGNOSIS — I129 Hypertensive chronic kidney disease with stage 1 through stage 4 chronic kidney disease, or unspecified chronic kidney disease: Secondary | ICD-10-CM | POA: Diagnosis not present

## 2019-02-17 DIAGNOSIS — E039 Hypothyroidism, unspecified: Secondary | ICD-10-CM | POA: Diagnosis not present

## 2019-02-17 DIAGNOSIS — E6609 Other obesity due to excess calories: Secondary | ICD-10-CM | POA: Diagnosis not present

## 2019-02-17 DIAGNOSIS — E7849 Other hyperlipidemia: Secondary | ICD-10-CM | POA: Diagnosis not present

## 2019-02-17 DIAGNOSIS — R7309 Other abnormal glucose: Secondary | ICD-10-CM | POA: Diagnosis not present

## 2019-02-18 DIAGNOSIS — E6609 Other obesity due to excess calories: Secondary | ICD-10-CM | POA: Diagnosis not present

## 2019-02-18 DIAGNOSIS — Z1389 Encounter for screening for other disorder: Secondary | ICD-10-CM | POA: Diagnosis not present

## 2019-02-18 DIAGNOSIS — Z0001 Encounter for general adult medical examination with abnormal findings: Secondary | ICD-10-CM | POA: Diagnosis not present

## 2019-02-18 DIAGNOSIS — R7309 Other abnormal glucose: Secondary | ICD-10-CM | POA: Diagnosis not present

## 2019-03-20 DIAGNOSIS — E039 Hypothyroidism, unspecified: Secondary | ICD-10-CM | POA: Diagnosis not present

## 2019-03-20 DIAGNOSIS — E7849 Other hyperlipidemia: Secondary | ICD-10-CM | POA: Diagnosis not present

## 2019-03-20 DIAGNOSIS — Q213 Tetralogy of Fallot: Secondary | ICD-10-CM | POA: Diagnosis not present

## 2019-03-20 DIAGNOSIS — N182 Chronic kidney disease, stage 2 (mild): Secondary | ICD-10-CM | POA: Diagnosis not present

## 2019-03-24 DIAGNOSIS — M25572 Pain in left ankle and joints of left foot: Secondary | ICD-10-CM | POA: Diagnosis not present

## 2019-03-24 DIAGNOSIS — M79672 Pain in left foot: Secondary | ICD-10-CM | POA: Diagnosis not present

## 2019-03-24 DIAGNOSIS — I5023 Acute on chronic systolic (congestive) heart failure: Secondary | ICD-10-CM | POA: Diagnosis not present

## 2019-03-24 DIAGNOSIS — I10 Essential (primary) hypertension: Secondary | ICD-10-CM | POA: Diagnosis not present

## 2019-03-24 DIAGNOSIS — Q213 Tetralogy of Fallot: Secondary | ICD-10-CM | POA: Diagnosis not present

## 2019-03-24 DIAGNOSIS — M85872 Other specified disorders of bone density and structure, left ankle and foot: Secondary | ICD-10-CM | POA: Diagnosis not present

## 2019-03-24 DIAGNOSIS — I4819 Other persistent atrial fibrillation: Secondary | ICD-10-CM | POA: Diagnosis not present

## 2019-03-24 DIAGNOSIS — I50812 Chronic right heart failure: Secondary | ICD-10-CM | POA: Diagnosis not present

## 2019-03-31 DIAGNOSIS — Q213 Tetralogy of Fallot: Secondary | ICD-10-CM | POA: Diagnosis not present

## 2019-03-31 DIAGNOSIS — I4819 Other persistent atrial fibrillation: Secondary | ICD-10-CM | POA: Diagnosis not present

## 2019-03-31 DIAGNOSIS — I10 Essential (primary) hypertension: Secondary | ICD-10-CM | POA: Diagnosis not present

## 2019-03-31 DIAGNOSIS — I5023 Acute on chronic systolic (congestive) heart failure: Secondary | ICD-10-CM | POA: Diagnosis not present

## 2019-04-15 DIAGNOSIS — Q213 Tetralogy of Fallot: Secondary | ICD-10-CM | POA: Diagnosis not present

## 2019-04-15 DIAGNOSIS — I4819 Other persistent atrial fibrillation: Secondary | ICD-10-CM | POA: Diagnosis not present

## 2019-04-15 DIAGNOSIS — I5023 Acute on chronic systolic (congestive) heart failure: Secondary | ICD-10-CM | POA: Diagnosis not present

## 2019-04-15 DIAGNOSIS — I10 Essential (primary) hypertension: Secondary | ICD-10-CM | POA: Diagnosis not present

## 2019-04-20 DIAGNOSIS — E7849 Other hyperlipidemia: Secondary | ICD-10-CM | POA: Diagnosis not present

## 2019-04-20 DIAGNOSIS — N182 Chronic kidney disease, stage 2 (mild): Secondary | ICD-10-CM | POA: Diagnosis not present

## 2019-04-20 DIAGNOSIS — E039 Hypothyroidism, unspecified: Secondary | ICD-10-CM | POA: Diagnosis not present

## 2019-04-20 DIAGNOSIS — I129 Hypertensive chronic kidney disease with stage 1 through stage 4 chronic kidney disease, or unspecified chronic kidney disease: Secondary | ICD-10-CM | POA: Diagnosis not present

## 2019-05-13 DIAGNOSIS — I129 Hypertensive chronic kidney disease with stage 1 through stage 4 chronic kidney disease, or unspecified chronic kidney disease: Secondary | ICD-10-CM | POA: Diagnosis not present

## 2019-05-13 DIAGNOSIS — R410 Disorientation, unspecified: Secondary | ICD-10-CM | POA: Diagnosis not present

## 2019-05-13 DIAGNOSIS — Z20822 Contact with and (suspected) exposure to covid-19: Secondary | ICD-10-CM | POA: Diagnosis present

## 2019-05-13 DIAGNOSIS — E861 Hypovolemia: Secondary | ICD-10-CM | POA: Diagnosis not present

## 2019-05-13 DIAGNOSIS — Q213 Tetralogy of Fallot: Secondary | ICD-10-CM | POA: Diagnosis not present

## 2019-05-13 DIAGNOSIS — E039 Hypothyroidism, unspecified: Secondary | ICD-10-CM | POA: Diagnosis present

## 2019-05-13 DIAGNOSIS — R188 Other ascites: Secondary | ICD-10-CM | POA: Diagnosis not present

## 2019-05-13 DIAGNOSIS — K7290 Hepatic failure, unspecified without coma: Secondary | ICD-10-CM | POA: Diagnosis present

## 2019-05-13 DIAGNOSIS — Z01810 Encounter for preprocedural cardiovascular examination: Secondary | ICD-10-CM | POA: Diagnosis not present

## 2019-05-13 DIAGNOSIS — R1312 Dysphagia, oropharyngeal phase: Secondary | ICD-10-CM | POA: Diagnosis not present

## 2019-05-13 DIAGNOSIS — T502X5A Adverse effect of carbonic-anhydrase inhibitors, benzothiadiazides and other diuretics, initial encounter: Secondary | ICD-10-CM | POA: Diagnosis not present

## 2019-05-13 DIAGNOSIS — I952 Hypotension due to drugs: Secondary | ICD-10-CM | POA: Diagnosis not present

## 2019-05-13 DIAGNOSIS — J811 Chronic pulmonary edema: Secondary | ICD-10-CM | POA: Diagnosis not present

## 2019-05-13 DIAGNOSIS — Z9889 Other specified postprocedural states: Secondary | ICD-10-CM | POA: Diagnosis not present

## 2019-05-13 DIAGNOSIS — Q221 Congenital pulmonary valve stenosis: Secondary | ICD-10-CM | POA: Diagnosis not present

## 2019-05-13 DIAGNOSIS — I4819 Other persistent atrial fibrillation: Secondary | ICD-10-CM | POA: Diagnosis present

## 2019-05-13 DIAGNOSIS — I10 Essential (primary) hypertension: Secondary | ICD-10-CM | POA: Diagnosis not present

## 2019-05-13 DIAGNOSIS — J9811 Atelectasis: Secondary | ICD-10-CM | POA: Diagnosis not present

## 2019-05-13 DIAGNOSIS — R339 Retention of urine, unspecified: Secondary | ICD-10-CM | POA: Diagnosis not present

## 2019-05-13 DIAGNOSIS — E7849 Other hyperlipidemia: Secondary | ICD-10-CM | POA: Diagnosis not present

## 2019-05-13 DIAGNOSIS — N179 Acute kidney failure, unspecified: Secondary | ICD-10-CM | POA: Diagnosis not present

## 2019-05-13 DIAGNOSIS — R310 Gross hematuria: Secondary | ICD-10-CM | POA: Diagnosis not present

## 2019-05-13 DIAGNOSIS — E876 Hypokalemia: Secondary | ICD-10-CM | POA: Diagnosis not present

## 2019-05-13 DIAGNOSIS — G9349 Other encephalopathy: Secondary | ICD-10-CM | POA: Diagnosis not present

## 2019-05-13 DIAGNOSIS — I509 Heart failure, unspecified: Secondary | ICD-10-CM | POA: Diagnosis not present

## 2019-05-13 DIAGNOSIS — R0902 Hypoxemia: Secondary | ICD-10-CM | POA: Diagnosis not present

## 2019-05-13 DIAGNOSIS — R278 Other lack of coordination: Secondary | ICD-10-CM | POA: Diagnosis not present

## 2019-05-13 DIAGNOSIS — J90 Pleural effusion, not elsewhere classified: Secondary | ICD-10-CM | POA: Diagnosis not present

## 2019-05-13 DIAGNOSIS — I9589 Other hypotension: Secondary | ICD-10-CM | POA: Diagnosis not present

## 2019-05-13 DIAGNOSIS — R918 Other nonspecific abnormal finding of lung field: Secondary | ICD-10-CM | POA: Diagnosis not present

## 2019-05-13 DIAGNOSIS — R570 Cardiogenic shock: Secondary | ICD-10-CM | POA: Diagnosis present

## 2019-05-13 DIAGNOSIS — I50813 Acute on chronic right heart failure: Secondary | ICD-10-CM | POA: Insufficient documentation

## 2019-05-13 DIAGNOSIS — R140 Abdominal distension (gaseous): Secondary | ICD-10-CM | POA: Diagnosis not present

## 2019-05-13 DIAGNOSIS — J986 Disorders of diaphragm: Secondary | ICD-10-CM | POA: Diagnosis not present

## 2019-05-13 DIAGNOSIS — Z8679 Personal history of other diseases of the circulatory system: Secondary | ICD-10-CM | POA: Diagnosis not present

## 2019-05-13 DIAGNOSIS — E8770 Fluid overload, unspecified: Secondary | ICD-10-CM | POA: Diagnosis not present

## 2019-05-13 DIAGNOSIS — I272 Pulmonary hypertension, unspecified: Secondary | ICD-10-CM | POA: Diagnosis present

## 2019-05-13 DIAGNOSIS — J9600 Acute respiratory failure, unspecified whether with hypoxia or hypercapnia: Secondary | ICD-10-CM | POA: Diagnosis not present

## 2019-05-13 DIAGNOSIS — Z79899 Other long term (current) drug therapy: Secondary | ICD-10-CM | POA: Diagnosis not present

## 2019-05-13 DIAGNOSIS — D696 Thrombocytopenia, unspecified: Secondary | ICD-10-CM | POA: Diagnosis not present

## 2019-05-13 DIAGNOSIS — I361 Nonrheumatic tricuspid (valve) insufficiency: Secondary | ICD-10-CM | POA: Diagnosis not present

## 2019-05-13 DIAGNOSIS — R571 Hypovolemic shock: Secondary | ICD-10-CM | POA: Diagnosis not present

## 2019-05-13 DIAGNOSIS — I371 Nonrheumatic pulmonary valve insufficiency: Secondary | ICD-10-CM | POA: Diagnosis present

## 2019-05-13 DIAGNOSIS — J9601 Acute respiratory failure with hypoxia: Secondary | ICD-10-CM | POA: Diagnosis not present

## 2019-05-13 DIAGNOSIS — J9602 Acute respiratory failure with hypercapnia: Secondary | ICD-10-CM | POA: Diagnosis not present

## 2019-05-13 DIAGNOSIS — R338 Other retention of urine: Secondary | ICD-10-CM | POA: Diagnosis not present

## 2019-05-13 DIAGNOSIS — E119 Type 2 diabetes mellitus without complications: Secondary | ICD-10-CM | POA: Diagnosis present

## 2019-05-13 DIAGNOSIS — E872 Acidosis: Secondary | ICD-10-CM | POA: Diagnosis not present

## 2019-05-13 DIAGNOSIS — I5033 Acute on chronic diastolic (congestive) heart failure: Secondary | ICD-10-CM | POA: Diagnosis present

## 2019-05-13 DIAGNOSIS — I110 Hypertensive heart disease with heart failure: Secondary | ICD-10-CM | POA: Diagnosis present

## 2019-05-13 DIAGNOSIS — I5082 Biventricular heart failure: Secondary | ICD-10-CM | POA: Diagnosis present

## 2019-05-13 DIAGNOSIS — I519 Heart disease, unspecified: Secondary | ICD-10-CM | POA: Diagnosis not present

## 2019-05-13 DIAGNOSIS — Q2547 Right aortic arch: Secondary | ICD-10-CM | POA: Diagnosis not present

## 2019-05-13 DIAGNOSIS — R358 Other polyuria: Secondary | ICD-10-CM | POA: Diagnosis not present

## 2019-05-13 DIAGNOSIS — I288 Other diseases of pulmonary vessels: Secondary | ICD-10-CM | POA: Diagnosis present

## 2019-05-13 DIAGNOSIS — Z8774 Personal history of (corrected) congenital malformations of heart and circulatory system: Secondary | ICD-10-CM | POA: Diagnosis not present

## 2019-05-13 DIAGNOSIS — J918 Pleural effusion in other conditions classified elsewhere: Secondary | ICD-10-CM | POA: Diagnosis not present

## 2019-05-13 DIAGNOSIS — I4891 Unspecified atrial fibrillation: Secondary | ICD-10-CM | POA: Diagnosis not present

## 2019-05-13 DIAGNOSIS — Z4659 Encounter for fitting and adjustment of other gastrointestinal appliance and device: Secondary | ICD-10-CM | POA: Diagnosis not present

## 2019-05-13 DIAGNOSIS — J984 Other disorders of lung: Secondary | ICD-10-CM | POA: Diagnosis not present

## 2019-05-13 DIAGNOSIS — J9621 Acute and chronic respiratory failure with hypoxia: Secondary | ICD-10-CM | POA: Diagnosis present

## 2019-05-13 DIAGNOSIS — J9622 Acute and chronic respiratory failure with hypercapnia: Secondary | ICD-10-CM | POA: Diagnosis present

## 2019-05-16 DIAGNOSIS — J9602 Acute respiratory failure with hypercapnia: Secondary | ICD-10-CM | POA: Insufficient documentation

## 2019-05-16 DIAGNOSIS — R0689 Other abnormalities of breathing: Secondary | ICD-10-CM | POA: Insufficient documentation

## 2019-05-16 DIAGNOSIS — E8729 Other acidosis: Secondary | ICD-10-CM | POA: Insufficient documentation

## 2019-05-16 DIAGNOSIS — J9601 Acute respiratory failure with hypoxia: Secondary | ICD-10-CM | POA: Insufficient documentation

## 2019-05-16 DIAGNOSIS — E872 Acidosis: Secondary | ICD-10-CM | POA: Insufficient documentation

## 2019-05-17 DIAGNOSIS — Z79899 Other long term (current) drug therapy: Secondary | ICD-10-CM | POA: Insufficient documentation

## 2019-05-17 DIAGNOSIS — R339 Retention of urine, unspecified: Secondary | ICD-10-CM | POA: Insufficient documentation

## 2019-05-17 DIAGNOSIS — E876 Hypokalemia: Secondary | ICD-10-CM | POA: Insufficient documentation

## 2019-05-17 DIAGNOSIS — T502X5A Adverse effect of carbonic-anhydrase inhibitors, benzothiadiazides and other diuretics, initial encounter: Secondary | ICD-10-CM | POA: Insufficient documentation

## 2019-05-18 DIAGNOSIS — I129 Hypertensive chronic kidney disease with stage 1 through stage 4 chronic kidney disease, or unspecified chronic kidney disease: Secondary | ICD-10-CM | POA: Diagnosis not present

## 2019-05-18 DIAGNOSIS — E039 Hypothyroidism, unspecified: Secondary | ICD-10-CM | POA: Diagnosis not present

## 2019-05-18 DIAGNOSIS — E7849 Other hyperlipidemia: Secondary | ICD-10-CM | POA: Diagnosis not present

## 2019-05-29 DIAGNOSIS — I371 Nonrheumatic pulmonary valve insufficiency: Secondary | ICD-10-CM | POA: Insufficient documentation

## 2019-05-29 DIAGNOSIS — Q221 Congenital pulmonary valve stenosis: Secondary | ICD-10-CM | POA: Insufficient documentation

## 2019-06-04 DIAGNOSIS — J45909 Unspecified asthma, uncomplicated: Secondary | ICD-10-CM | POA: Diagnosis not present

## 2019-06-04 DIAGNOSIS — I4891 Unspecified atrial fibrillation: Secondary | ICD-10-CM | POA: Diagnosis not present

## 2019-06-04 DIAGNOSIS — M1990 Unspecified osteoarthritis, unspecified site: Secondary | ICD-10-CM | POA: Diagnosis not present

## 2019-06-04 DIAGNOSIS — E039 Hypothyroidism, unspecified: Secondary | ICD-10-CM | POA: Diagnosis not present

## 2019-06-04 DIAGNOSIS — I110 Hypertensive heart disease with heart failure: Secondary | ICD-10-CM | POA: Diagnosis not present

## 2019-06-04 DIAGNOSIS — Z8774 Personal history of (corrected) congenital malformations of heart and circulatory system: Secondary | ICD-10-CM | POA: Diagnosis not present

## 2019-06-04 DIAGNOSIS — I370 Nonrheumatic pulmonary valve stenosis: Secondary | ICD-10-CM | POA: Diagnosis not present

## 2019-06-04 DIAGNOSIS — J9621 Acute and chronic respiratory failure with hypoxia: Secondary | ICD-10-CM | POA: Diagnosis not present

## 2019-06-04 DIAGNOSIS — Z7901 Long term (current) use of anticoagulants: Secondary | ICD-10-CM | POA: Diagnosis not present

## 2019-06-04 DIAGNOSIS — J9622 Acute and chronic respiratory failure with hypercapnia: Secondary | ICD-10-CM | POA: Diagnosis not present

## 2019-06-04 DIAGNOSIS — Z9981 Dependence on supplemental oxygen: Secondary | ICD-10-CM | POA: Diagnosis not present

## 2019-06-04 DIAGNOSIS — I371 Nonrheumatic pulmonary valve insufficiency: Secondary | ICD-10-CM | POA: Diagnosis not present

## 2019-06-04 DIAGNOSIS — I50813 Acute on chronic right heart failure: Secondary | ICD-10-CM | POA: Diagnosis not present

## 2019-06-04 DIAGNOSIS — I5033 Acute on chronic diastolic (congestive) heart failure: Secondary | ICD-10-CM | POA: Diagnosis not present

## 2019-06-09 DIAGNOSIS — I110 Hypertensive heart disease with heart failure: Secondary | ICD-10-CM | POA: Diagnosis not present

## 2019-06-09 DIAGNOSIS — I4891 Unspecified atrial fibrillation: Secondary | ICD-10-CM | POA: Diagnosis not present

## 2019-06-09 DIAGNOSIS — I50813 Acute on chronic right heart failure: Secondary | ICD-10-CM | POA: Diagnosis not present

## 2019-06-09 DIAGNOSIS — J9621 Acute and chronic respiratory failure with hypoxia: Secondary | ICD-10-CM | POA: Diagnosis not present

## 2019-06-09 DIAGNOSIS — I5033 Acute on chronic diastolic (congestive) heart failure: Secondary | ICD-10-CM | POA: Diagnosis not present

## 2019-06-09 DIAGNOSIS — J9622 Acute and chronic respiratory failure with hypercapnia: Secondary | ICD-10-CM | POA: Diagnosis not present

## 2019-06-10 DIAGNOSIS — Z5181 Encounter for therapeutic drug level monitoring: Secondary | ICD-10-CM | POA: Diagnosis not present

## 2019-06-10 DIAGNOSIS — Q213 Tetralogy of Fallot: Secondary | ICD-10-CM | POA: Diagnosis not present

## 2019-06-10 DIAGNOSIS — I10 Essential (primary) hypertension: Secondary | ICD-10-CM | POA: Diagnosis not present

## 2019-06-10 DIAGNOSIS — I50812 Chronic right heart failure: Secondary | ICD-10-CM | POA: Diagnosis not present

## 2019-06-10 DIAGNOSIS — I50813 Acute on chronic right heart failure: Secondary | ICD-10-CM | POA: Diagnosis not present

## 2019-06-10 DIAGNOSIS — I4819 Other persistent atrial fibrillation: Secondary | ICD-10-CM | POA: Diagnosis not present

## 2019-06-10 DIAGNOSIS — I5023 Acute on chronic systolic (congestive) heart failure: Secondary | ICD-10-CM | POA: Diagnosis not present

## 2019-06-11 DIAGNOSIS — I110 Hypertensive heart disease with heart failure: Secondary | ICD-10-CM | POA: Diagnosis not present

## 2019-06-11 DIAGNOSIS — J9621 Acute and chronic respiratory failure with hypoxia: Secondary | ICD-10-CM | POA: Diagnosis not present

## 2019-06-11 DIAGNOSIS — J9622 Acute and chronic respiratory failure with hypercapnia: Secondary | ICD-10-CM | POA: Diagnosis not present

## 2019-06-11 DIAGNOSIS — I5033 Acute on chronic diastolic (congestive) heart failure: Secondary | ICD-10-CM | POA: Diagnosis not present

## 2019-06-11 DIAGNOSIS — I50813 Acute on chronic right heart failure: Secondary | ICD-10-CM | POA: Diagnosis not present

## 2019-06-11 DIAGNOSIS — I4891 Unspecified atrial fibrillation: Secondary | ICD-10-CM | POA: Diagnosis not present

## 2019-06-13 DIAGNOSIS — I110 Hypertensive heart disease with heart failure: Secondary | ICD-10-CM | POA: Diagnosis not present

## 2019-06-13 DIAGNOSIS — I5033 Acute on chronic diastolic (congestive) heart failure: Secondary | ICD-10-CM | POA: Diagnosis not present

## 2019-06-13 DIAGNOSIS — J9621 Acute and chronic respiratory failure with hypoxia: Secondary | ICD-10-CM | POA: Diagnosis not present

## 2019-06-13 DIAGNOSIS — I50813 Acute on chronic right heart failure: Secondary | ICD-10-CM | POA: Diagnosis not present

## 2019-06-13 DIAGNOSIS — J9622 Acute and chronic respiratory failure with hypercapnia: Secondary | ICD-10-CM | POA: Diagnosis not present

## 2019-06-13 DIAGNOSIS — I4891 Unspecified atrial fibrillation: Secondary | ICD-10-CM | POA: Diagnosis not present

## 2019-06-16 DIAGNOSIS — J9621 Acute and chronic respiratory failure with hypoxia: Secondary | ICD-10-CM | POA: Diagnosis not present

## 2019-06-16 DIAGNOSIS — I5033 Acute on chronic diastolic (congestive) heart failure: Secondary | ICD-10-CM | POA: Diagnosis not present

## 2019-06-16 DIAGNOSIS — I110 Hypertensive heart disease with heart failure: Secondary | ICD-10-CM | POA: Diagnosis not present

## 2019-06-16 DIAGNOSIS — J9622 Acute and chronic respiratory failure with hypercapnia: Secondary | ICD-10-CM | POA: Diagnosis not present

## 2019-06-16 DIAGNOSIS — I4891 Unspecified atrial fibrillation: Secondary | ICD-10-CM | POA: Diagnosis not present

## 2019-06-16 DIAGNOSIS — I50813 Acute on chronic right heart failure: Secondary | ICD-10-CM | POA: Diagnosis not present

## 2019-06-17 DIAGNOSIS — Q221 Congenital pulmonary valve stenosis: Secondary | ICD-10-CM | POA: Diagnosis not present

## 2019-06-17 DIAGNOSIS — I371 Nonrheumatic pulmonary valve insufficiency: Secondary | ICD-10-CM | POA: Diagnosis not present

## 2019-06-17 DIAGNOSIS — Q213 Tetralogy of Fallot: Secondary | ICD-10-CM | POA: Diagnosis not present

## 2019-06-17 DIAGNOSIS — I10 Essential (primary) hypertension: Secondary | ICD-10-CM | POA: Diagnosis not present

## 2019-06-17 DIAGNOSIS — I50813 Acute on chronic right heart failure: Secondary | ICD-10-CM | POA: Diagnosis not present

## 2019-06-18 DIAGNOSIS — I50813 Acute on chronic right heart failure: Secondary | ICD-10-CM | POA: Diagnosis not present

## 2019-06-18 DIAGNOSIS — J9622 Acute and chronic respiratory failure with hypercapnia: Secondary | ICD-10-CM | POA: Diagnosis not present

## 2019-06-18 DIAGNOSIS — I110 Hypertensive heart disease with heart failure: Secondary | ICD-10-CM | POA: Diagnosis not present

## 2019-06-18 DIAGNOSIS — J9621 Acute and chronic respiratory failure with hypoxia: Secondary | ICD-10-CM | POA: Diagnosis not present

## 2019-06-18 DIAGNOSIS — I5033 Acute on chronic diastolic (congestive) heart failure: Secondary | ICD-10-CM | POA: Diagnosis not present

## 2019-06-18 DIAGNOSIS — I4891 Unspecified atrial fibrillation: Secondary | ICD-10-CM | POA: Diagnosis not present

## 2019-06-19 DIAGNOSIS — R600 Localized edema: Secondary | ICD-10-CM | POA: Diagnosis not present

## 2019-06-19 DIAGNOSIS — Z8774 Personal history of (corrected) congenital malformations of heart and circulatory system: Secondary | ICD-10-CM | POA: Diagnosis not present

## 2019-06-19 DIAGNOSIS — I4819 Other persistent atrial fibrillation: Secondary | ICD-10-CM | POA: Diagnosis not present

## 2019-06-19 DIAGNOSIS — Q2547 Right aortic arch: Secondary | ICD-10-CM | POA: Diagnosis not present

## 2019-06-19 DIAGNOSIS — J9621 Acute and chronic respiratory failure with hypoxia: Secondary | ICD-10-CM | POA: Diagnosis not present

## 2019-06-19 DIAGNOSIS — E119 Type 2 diabetes mellitus without complications: Secondary | ICD-10-CM | POA: Diagnosis present

## 2019-06-19 DIAGNOSIS — Z7901 Long term (current) use of anticoagulants: Secondary | ICD-10-CM | POA: Diagnosis not present

## 2019-06-19 DIAGNOSIS — I4891 Unspecified atrial fibrillation: Secondary | ICD-10-CM | POA: Diagnosis not present

## 2019-06-19 DIAGNOSIS — J811 Chronic pulmonary edema: Secondary | ICD-10-CM | POA: Diagnosis not present

## 2019-06-19 DIAGNOSIS — Q213 Tetralogy of Fallot: Secondary | ICD-10-CM | POA: Diagnosis not present

## 2019-06-19 DIAGNOSIS — Z880 Allergy status to penicillin: Secondary | ICD-10-CM | POA: Diagnosis not present

## 2019-06-19 DIAGNOSIS — J90 Pleural effusion, not elsewhere classified: Secondary | ICD-10-CM | POA: Diagnosis not present

## 2019-06-19 DIAGNOSIS — I110 Hypertensive heart disease with heart failure: Secondary | ICD-10-CM | POA: Diagnosis present

## 2019-06-19 DIAGNOSIS — J986 Disorders of diaphragm: Secondary | ICD-10-CM | POA: Diagnosis present

## 2019-06-19 DIAGNOSIS — Z9889 Other specified postprocedural states: Secondary | ICD-10-CM | POA: Diagnosis not present

## 2019-06-19 DIAGNOSIS — T82857A Stenosis of cardiac prosthetic devices, implants and grafts, initial encounter: Secondary | ICD-10-CM | POA: Diagnosis present

## 2019-06-19 DIAGNOSIS — T82598A Other mechanical complication of other cardiac and vascular devices and implants, initial encounter: Secondary | ICD-10-CM | POA: Diagnosis not present

## 2019-06-19 DIAGNOSIS — E876 Hypokalemia: Secondary | ICD-10-CM | POA: Diagnosis not present

## 2019-06-19 DIAGNOSIS — I10 Essential (primary) hypertension: Secondary | ICD-10-CM | POA: Diagnosis not present

## 2019-06-19 DIAGNOSIS — Z952 Presence of prosthetic heart valve: Secondary | ICD-10-CM | POA: Diagnosis not present

## 2019-06-19 DIAGNOSIS — I50813 Acute on chronic right heart failure: Secondary | ICD-10-CM | POA: Diagnosis present

## 2019-06-19 DIAGNOSIS — G5681 Other specified mononeuropathies of right upper limb: Secondary | ICD-10-CM | POA: Diagnosis not present

## 2019-06-19 DIAGNOSIS — D821 Di George's syndrome: Secondary | ICD-10-CM | POA: Diagnosis present

## 2019-06-19 DIAGNOSIS — R918 Other nonspecific abnormal finding of lung field: Secondary | ICD-10-CM | POA: Diagnosis not present

## 2019-06-19 DIAGNOSIS — J9622 Acute and chronic respiratory failure with hypercapnia: Secondary | ICD-10-CM | POA: Diagnosis not present

## 2019-06-19 DIAGNOSIS — E039 Hypothyroidism, unspecified: Secondary | ICD-10-CM | POA: Diagnosis present

## 2019-06-19 DIAGNOSIS — Q256 Stenosis of pulmonary artery: Secondary | ICD-10-CM | POA: Diagnosis not present

## 2019-06-19 DIAGNOSIS — R0602 Shortness of breath: Secondary | ICD-10-CM | POA: Diagnosis not present

## 2019-06-19 DIAGNOSIS — E208 Other hypoparathyroidism: Secondary | ICD-10-CM | POA: Diagnosis not present

## 2019-06-19 DIAGNOSIS — E559 Vitamin D deficiency, unspecified: Secondary | ICD-10-CM | POA: Diagnosis not present

## 2019-06-19 DIAGNOSIS — R4182 Altered mental status, unspecified: Secondary | ICD-10-CM | POA: Diagnosis not present

## 2019-06-19 DIAGNOSIS — J9602 Acute respiratory failure with hypercapnia: Secondary | ICD-10-CM | POA: Diagnosis not present

## 2019-06-19 DIAGNOSIS — I5082 Biventricular heart failure: Secondary | ICD-10-CM | POA: Diagnosis not present

## 2019-06-19 DIAGNOSIS — E872 Acidosis: Secondary | ICD-10-CM | POA: Diagnosis not present

## 2019-06-19 DIAGNOSIS — I517 Cardiomegaly: Secondary | ICD-10-CM | POA: Diagnosis not present

## 2019-06-19 DIAGNOSIS — Z20822 Contact with and (suspected) exposure to covid-19: Secondary | ICD-10-CM | POA: Diagnosis present

## 2019-06-19 DIAGNOSIS — Z888 Allergy status to other drugs, medicaments and biological substances status: Secondary | ICD-10-CM | POA: Diagnosis not present

## 2019-06-19 DIAGNOSIS — J984 Other disorders of lung: Secondary | ICD-10-CM | POA: Diagnosis not present

## 2019-06-19 DIAGNOSIS — I272 Pulmonary hypertension, unspecified: Secondary | ICD-10-CM | POA: Diagnosis present

## 2019-06-19 DIAGNOSIS — J9601 Acute respiratory failure with hypoxia: Secondary | ICD-10-CM | POA: Diagnosis not present

## 2019-06-23 DIAGNOSIS — E039 Hypothyroidism, unspecified: Secondary | ICD-10-CM | POA: Diagnosis not present

## 2019-06-23 DIAGNOSIS — J9621 Acute and chronic respiratory failure with hypoxia: Secondary | ICD-10-CM | POA: Diagnosis not present

## 2019-06-23 DIAGNOSIS — I4891 Unspecified atrial fibrillation: Secondary | ICD-10-CM | POA: Diagnosis not present

## 2019-06-23 DIAGNOSIS — I110 Hypertensive heart disease with heart failure: Secondary | ICD-10-CM | POA: Diagnosis not present

## 2019-06-25 DIAGNOSIS — E208 Other hypoparathyroidism: Secondary | ICD-10-CM | POA: Insufficient documentation

## 2019-07-04 DIAGNOSIS — I5033 Acute on chronic diastolic (congestive) heart failure: Secondary | ICD-10-CM | POA: Diagnosis not present

## 2019-07-04 DIAGNOSIS — Z8774 Personal history of (corrected) congenital malformations of heart and circulatory system: Secondary | ICD-10-CM | POA: Diagnosis not present

## 2019-07-04 DIAGNOSIS — I4891 Unspecified atrial fibrillation: Secondary | ICD-10-CM | POA: Diagnosis not present

## 2019-07-04 DIAGNOSIS — I370 Nonrheumatic pulmonary valve stenosis: Secondary | ICD-10-CM | POA: Diagnosis not present

## 2019-07-04 DIAGNOSIS — I371 Nonrheumatic pulmonary valve insufficiency: Secondary | ICD-10-CM | POA: Diagnosis not present

## 2019-07-04 DIAGNOSIS — Z7901 Long term (current) use of anticoagulants: Secondary | ICD-10-CM | POA: Diagnosis not present

## 2019-07-04 DIAGNOSIS — I50813 Acute on chronic right heart failure: Secondary | ICD-10-CM | POA: Diagnosis not present

## 2019-07-04 DIAGNOSIS — Z9981 Dependence on supplemental oxygen: Secondary | ICD-10-CM | POA: Diagnosis not present

## 2019-07-04 DIAGNOSIS — J45909 Unspecified asthma, uncomplicated: Secondary | ICD-10-CM | POA: Diagnosis not present

## 2019-07-04 DIAGNOSIS — J9621 Acute and chronic respiratory failure with hypoxia: Secondary | ICD-10-CM | POA: Diagnosis not present

## 2019-07-04 DIAGNOSIS — M1990 Unspecified osteoarthritis, unspecified site: Secondary | ICD-10-CM | POA: Diagnosis not present

## 2019-07-04 DIAGNOSIS — J9622 Acute and chronic respiratory failure with hypercapnia: Secondary | ICD-10-CM | POA: Diagnosis not present

## 2019-07-04 DIAGNOSIS — E039 Hypothyroidism, unspecified: Secondary | ICD-10-CM | POA: Diagnosis not present

## 2019-07-04 DIAGNOSIS — I110 Hypertensive heart disease with heart failure: Secondary | ICD-10-CM | POA: Diagnosis not present

## 2019-07-07 DIAGNOSIS — I4891 Unspecified atrial fibrillation: Secondary | ICD-10-CM | POA: Diagnosis not present

## 2019-07-07 DIAGNOSIS — I110 Hypertensive heart disease with heart failure: Secondary | ICD-10-CM | POA: Diagnosis not present

## 2019-07-07 DIAGNOSIS — I50813 Acute on chronic right heart failure: Secondary | ICD-10-CM | POA: Diagnosis not present

## 2019-07-07 DIAGNOSIS — J9621 Acute and chronic respiratory failure with hypoxia: Secondary | ICD-10-CM | POA: Diagnosis not present

## 2019-07-07 DIAGNOSIS — I5033 Acute on chronic diastolic (congestive) heart failure: Secondary | ICD-10-CM | POA: Diagnosis not present

## 2019-07-07 DIAGNOSIS — J9622 Acute and chronic respiratory failure with hypercapnia: Secondary | ICD-10-CM | POA: Diagnosis not present

## 2019-07-08 DIAGNOSIS — J984 Other disorders of lung: Secondary | ICD-10-CM | POA: Diagnosis not present

## 2019-07-08 DIAGNOSIS — J9621 Acute and chronic respiratory failure with hypoxia: Secondary | ICD-10-CM | POA: Diagnosis not present

## 2019-07-08 DIAGNOSIS — I4819 Other persistent atrial fibrillation: Secondary | ICD-10-CM | POA: Diagnosis not present

## 2019-07-08 DIAGNOSIS — I5033 Acute on chronic diastolic (congestive) heart failure: Secondary | ICD-10-CM | POA: Diagnosis not present

## 2019-07-08 DIAGNOSIS — I110 Hypertensive heart disease with heart failure: Secondary | ICD-10-CM | POA: Diagnosis not present

## 2019-07-08 DIAGNOSIS — J9622 Acute and chronic respiratory failure with hypercapnia: Secondary | ICD-10-CM | POA: Diagnosis not present

## 2019-07-08 DIAGNOSIS — I50813 Acute on chronic right heart failure: Secondary | ICD-10-CM | POA: Diagnosis not present

## 2019-07-08 DIAGNOSIS — I371 Nonrheumatic pulmonary valve insufficiency: Secondary | ICD-10-CM | POA: Diagnosis not present

## 2019-07-08 DIAGNOSIS — Q213 Tetralogy of Fallot: Secondary | ICD-10-CM | POA: Diagnosis not present

## 2019-07-08 DIAGNOSIS — I10 Essential (primary) hypertension: Secondary | ICD-10-CM | POA: Diagnosis not present

## 2019-07-08 DIAGNOSIS — I4891 Unspecified atrial fibrillation: Secondary | ICD-10-CM | POA: Diagnosis not present

## 2019-07-09 DIAGNOSIS — Z23 Encounter for immunization: Secondary | ICD-10-CM | POA: Diagnosis not present

## 2019-07-10 DIAGNOSIS — I5033 Acute on chronic diastolic (congestive) heart failure: Secondary | ICD-10-CM | POA: Diagnosis not present

## 2019-07-10 DIAGNOSIS — I50813 Acute on chronic right heart failure: Secondary | ICD-10-CM | POA: Diagnosis not present

## 2019-07-10 DIAGNOSIS — J9622 Acute and chronic respiratory failure with hypercapnia: Secondary | ICD-10-CM | POA: Diagnosis not present

## 2019-07-10 DIAGNOSIS — I4891 Unspecified atrial fibrillation: Secondary | ICD-10-CM | POA: Diagnosis not present

## 2019-07-10 DIAGNOSIS — J9621 Acute and chronic respiratory failure with hypoxia: Secondary | ICD-10-CM | POA: Diagnosis not present

## 2019-07-10 DIAGNOSIS — I110 Hypertensive heart disease with heart failure: Secondary | ICD-10-CM | POA: Diagnosis not present

## 2019-07-16 DIAGNOSIS — I4891 Unspecified atrial fibrillation: Secondary | ICD-10-CM | POA: Diagnosis not present

## 2019-07-16 DIAGNOSIS — J9621 Acute and chronic respiratory failure with hypoxia: Secondary | ICD-10-CM | POA: Diagnosis not present

## 2019-07-16 DIAGNOSIS — J9622 Acute and chronic respiratory failure with hypercapnia: Secondary | ICD-10-CM | POA: Diagnosis not present

## 2019-07-16 DIAGNOSIS — I5033 Acute on chronic diastolic (congestive) heart failure: Secondary | ICD-10-CM | POA: Diagnosis not present

## 2019-07-16 DIAGNOSIS — I50813 Acute on chronic right heart failure: Secondary | ICD-10-CM | POA: Diagnosis not present

## 2019-07-16 DIAGNOSIS — I110 Hypertensive heart disease with heart failure: Secondary | ICD-10-CM | POA: Diagnosis not present

## 2019-07-18 DIAGNOSIS — N182 Chronic kidney disease, stage 2 (mild): Secondary | ICD-10-CM | POA: Diagnosis not present

## 2019-07-18 DIAGNOSIS — I5033 Acute on chronic diastolic (congestive) heart failure: Secondary | ICD-10-CM | POA: Diagnosis not present

## 2019-07-18 DIAGNOSIS — I50813 Acute on chronic right heart failure: Secondary | ICD-10-CM | POA: Diagnosis not present

## 2019-07-18 DIAGNOSIS — J9621 Acute and chronic respiratory failure with hypoxia: Secondary | ICD-10-CM | POA: Diagnosis not present

## 2019-07-18 DIAGNOSIS — I110 Hypertensive heart disease with heart failure: Secondary | ICD-10-CM | POA: Diagnosis not present

## 2019-07-18 DIAGNOSIS — E7849 Other hyperlipidemia: Secondary | ICD-10-CM | POA: Diagnosis not present

## 2019-07-18 DIAGNOSIS — I4891 Unspecified atrial fibrillation: Secondary | ICD-10-CM | POA: Diagnosis not present

## 2019-07-18 DIAGNOSIS — I129 Hypertensive chronic kidney disease with stage 1 through stage 4 chronic kidney disease, or unspecified chronic kidney disease: Secondary | ICD-10-CM | POA: Diagnosis not present

## 2019-07-18 DIAGNOSIS — E039 Hypothyroidism, unspecified: Secondary | ICD-10-CM | POA: Diagnosis not present

## 2019-07-18 DIAGNOSIS — J9622 Acute and chronic respiratory failure with hypercapnia: Secondary | ICD-10-CM | POA: Diagnosis not present

## 2019-07-21 DIAGNOSIS — I50813 Acute on chronic right heart failure: Secondary | ICD-10-CM | POA: Diagnosis not present

## 2019-07-21 DIAGNOSIS — I110 Hypertensive heart disease with heart failure: Secondary | ICD-10-CM | POA: Diagnosis not present

## 2019-07-21 DIAGNOSIS — J9622 Acute and chronic respiratory failure with hypercapnia: Secondary | ICD-10-CM | POA: Diagnosis not present

## 2019-07-21 DIAGNOSIS — I5033 Acute on chronic diastolic (congestive) heart failure: Secondary | ICD-10-CM | POA: Diagnosis not present

## 2019-07-21 DIAGNOSIS — J9621 Acute and chronic respiratory failure with hypoxia: Secondary | ICD-10-CM | POA: Diagnosis not present

## 2019-07-21 DIAGNOSIS — I4891 Unspecified atrial fibrillation: Secondary | ICD-10-CM | POA: Diagnosis not present

## 2019-07-24 DIAGNOSIS — J9621 Acute and chronic respiratory failure with hypoxia: Secondary | ICD-10-CM | POA: Diagnosis not present

## 2019-07-24 DIAGNOSIS — I50813 Acute on chronic right heart failure: Secondary | ICD-10-CM | POA: Diagnosis not present

## 2019-07-24 DIAGNOSIS — I4891 Unspecified atrial fibrillation: Secondary | ICD-10-CM | POA: Diagnosis not present

## 2019-07-24 DIAGNOSIS — I110 Hypertensive heart disease with heart failure: Secondary | ICD-10-CM | POA: Diagnosis not present

## 2019-07-24 DIAGNOSIS — I5033 Acute on chronic diastolic (congestive) heart failure: Secondary | ICD-10-CM | POA: Diagnosis not present

## 2019-07-24 DIAGNOSIS — J9622 Acute and chronic respiratory failure with hypercapnia: Secondary | ICD-10-CM | POA: Diagnosis not present

## 2019-07-28 DIAGNOSIS — J9622 Acute and chronic respiratory failure with hypercapnia: Secondary | ICD-10-CM | POA: Diagnosis not present

## 2019-07-28 DIAGNOSIS — I50813 Acute on chronic right heart failure: Secondary | ICD-10-CM | POA: Diagnosis not present

## 2019-07-28 DIAGNOSIS — J9621 Acute and chronic respiratory failure with hypoxia: Secondary | ICD-10-CM | POA: Diagnosis not present

## 2019-07-28 DIAGNOSIS — I5033 Acute on chronic diastolic (congestive) heart failure: Secondary | ICD-10-CM | POA: Diagnosis not present

## 2019-07-28 DIAGNOSIS — I110 Hypertensive heart disease with heart failure: Secondary | ICD-10-CM | POA: Diagnosis not present

## 2019-07-28 DIAGNOSIS — I4891 Unspecified atrial fibrillation: Secondary | ICD-10-CM | POA: Diagnosis not present

## 2019-08-01 DIAGNOSIS — I4891 Unspecified atrial fibrillation: Secondary | ICD-10-CM | POA: Diagnosis not present

## 2019-08-01 DIAGNOSIS — I5033 Acute on chronic diastolic (congestive) heart failure: Secondary | ICD-10-CM | POA: Diagnosis not present

## 2019-08-01 DIAGNOSIS — I50813 Acute on chronic right heart failure: Secondary | ICD-10-CM | POA: Diagnosis not present

## 2019-08-01 DIAGNOSIS — J9622 Acute and chronic respiratory failure with hypercapnia: Secondary | ICD-10-CM | POA: Diagnosis not present

## 2019-08-01 DIAGNOSIS — J9621 Acute and chronic respiratory failure with hypoxia: Secondary | ICD-10-CM | POA: Diagnosis not present

## 2019-08-01 DIAGNOSIS — I110 Hypertensive heart disease with heart failure: Secondary | ICD-10-CM | POA: Diagnosis not present

## 2019-08-05 DIAGNOSIS — Q213 Tetralogy of Fallot: Secondary | ICD-10-CM | POA: Diagnosis not present

## 2019-08-05 DIAGNOSIS — I50813 Acute on chronic right heart failure: Secondary | ICD-10-CM | POA: Diagnosis not present

## 2019-08-06 DIAGNOSIS — Z23 Encounter for immunization: Secondary | ICD-10-CM | POA: Diagnosis not present

## 2019-08-18 DIAGNOSIS — E7849 Other hyperlipidemia: Secondary | ICD-10-CM | POA: Diagnosis not present

## 2019-08-18 DIAGNOSIS — E039 Hypothyroidism, unspecified: Secondary | ICD-10-CM | POA: Diagnosis not present

## 2019-08-18 DIAGNOSIS — I129 Hypertensive chronic kidney disease with stage 1 through stage 4 chronic kidney disease, or unspecified chronic kidney disease: Secondary | ICD-10-CM | POA: Diagnosis not present

## 2019-08-18 DIAGNOSIS — N182 Chronic kidney disease, stage 2 (mild): Secondary | ICD-10-CM | POA: Diagnosis not present

## 2019-09-02 DIAGNOSIS — Z6826 Body mass index (BMI) 26.0-26.9, adult: Secondary | ICD-10-CM | POA: Diagnosis not present

## 2019-09-02 DIAGNOSIS — I10 Essential (primary) hypertension: Secondary | ICD-10-CM | POA: Diagnosis not present

## 2019-09-02 DIAGNOSIS — Q213 Tetralogy of Fallot: Secondary | ICD-10-CM | POA: Diagnosis not present

## 2019-09-02 DIAGNOSIS — E7849 Other hyperlipidemia: Secondary | ICD-10-CM | POA: Diagnosis not present

## 2019-09-02 DIAGNOSIS — R7309 Other abnormal glucose: Secondary | ICD-10-CM | POA: Diagnosis not present

## 2019-09-02 DIAGNOSIS — E039 Hypothyroidism, unspecified: Secondary | ICD-10-CM | POA: Diagnosis not present

## 2019-09-02 DIAGNOSIS — E663 Overweight: Secondary | ICD-10-CM | POA: Diagnosis not present

## 2019-09-04 DIAGNOSIS — Q213 Tetralogy of Fallot: Secondary | ICD-10-CM | POA: Diagnosis not present

## 2019-09-04 DIAGNOSIS — E208 Other hypoparathyroidism: Secondary | ICD-10-CM | POA: Diagnosis not present

## 2019-09-04 DIAGNOSIS — Z7183 Encounter for nonprocreative genetic counseling: Secondary | ICD-10-CM | POA: Diagnosis not present

## 2019-09-17 DIAGNOSIS — E7849 Other hyperlipidemia: Secondary | ICD-10-CM | POA: Diagnosis not present

## 2019-09-17 DIAGNOSIS — I10 Essential (primary) hypertension: Secondary | ICD-10-CM | POA: Diagnosis not present

## 2019-09-17 DIAGNOSIS — E039 Hypothyroidism, unspecified: Secondary | ICD-10-CM | POA: Diagnosis not present

## 2019-09-17 DIAGNOSIS — D649 Anemia, unspecified: Secondary | ICD-10-CM | POA: Diagnosis not present

## 2019-09-17 DIAGNOSIS — Q213 Tetralogy of Fallot: Secondary | ICD-10-CM | POA: Diagnosis not present

## 2019-10-03 DIAGNOSIS — Q221 Congenital pulmonary valve stenosis: Secondary | ICD-10-CM | POA: Diagnosis not present

## 2019-10-03 DIAGNOSIS — Z79899 Other long term (current) drug therapy: Secondary | ICD-10-CM | POA: Diagnosis not present

## 2019-10-03 DIAGNOSIS — I5032 Chronic diastolic (congestive) heart failure: Secondary | ICD-10-CM | POA: Diagnosis not present

## 2019-10-03 DIAGNOSIS — I50812 Chronic right heart failure: Secondary | ICD-10-CM | POA: Diagnosis not present

## 2019-10-03 DIAGNOSIS — I4819 Other persistent atrial fibrillation: Secondary | ICD-10-CM | POA: Diagnosis not present

## 2019-10-03 DIAGNOSIS — Q213 Tetralogy of Fallot: Secondary | ICD-10-CM | POA: Diagnosis not present

## 2019-10-03 DIAGNOSIS — Z952 Presence of prosthetic heart valve: Secondary | ICD-10-CM | POA: Diagnosis not present

## 2019-10-03 DIAGNOSIS — I10 Essential (primary) hypertension: Secondary | ICD-10-CM | POA: Diagnosis not present

## 2019-10-03 DIAGNOSIS — I110 Hypertensive heart disease with heart failure: Secondary | ICD-10-CM | POA: Diagnosis not present

## 2019-10-03 DIAGNOSIS — Z8774 Personal history of (corrected) congenital malformations of heart and circulatory system: Secondary | ICD-10-CM | POA: Diagnosis not present

## 2019-10-03 DIAGNOSIS — I371 Nonrheumatic pulmonary valve insufficiency: Secondary | ICD-10-CM | POA: Diagnosis not present

## 2019-10-03 DIAGNOSIS — Z48812 Encounter for surgical aftercare following surgery on the circulatory system: Secondary | ICD-10-CM | POA: Diagnosis not present

## 2019-10-03 DIAGNOSIS — Z7901 Long term (current) use of anticoagulants: Secondary | ICD-10-CM | POA: Diagnosis not present

## 2019-10-03 DIAGNOSIS — E208 Other hypoparathyroidism: Secondary | ICD-10-CM | POA: Diagnosis not present

## 2019-10-08 DIAGNOSIS — Q213 Tetralogy of Fallot: Secondary | ICD-10-CM | POA: Diagnosis not present

## 2019-11-18 DIAGNOSIS — Q213 Tetralogy of Fallot: Secondary | ICD-10-CM | POA: Diagnosis not present

## 2019-11-18 DIAGNOSIS — E039 Hypothyroidism, unspecified: Secondary | ICD-10-CM | POA: Diagnosis not present

## 2019-11-18 DIAGNOSIS — E7849 Other hyperlipidemia: Secondary | ICD-10-CM | POA: Diagnosis not present

## 2019-11-18 DIAGNOSIS — I10 Essential (primary) hypertension: Secondary | ICD-10-CM | POA: Diagnosis not present

## 2020-01-17 DIAGNOSIS — E039 Hypothyroidism, unspecified: Secondary | ICD-10-CM | POA: Diagnosis not present

## 2020-01-17 DIAGNOSIS — E7849 Other hyperlipidemia: Secondary | ICD-10-CM | POA: Diagnosis not present

## 2020-01-17 DIAGNOSIS — N182 Chronic kidney disease, stage 2 (mild): Secondary | ICD-10-CM | POA: Diagnosis not present

## 2020-01-17 DIAGNOSIS — I129 Hypertensive chronic kidney disease with stage 1 through stage 4 chronic kidney disease, or unspecified chronic kidney disease: Secondary | ICD-10-CM | POA: Diagnosis not present

## 2020-02-05 DIAGNOSIS — Q9381 Velo-cardio-facial syndrome: Secondary | ICD-10-CM | POA: Diagnosis not present

## 2020-02-17 ENCOUNTER — Other Ambulatory Visit: Payer: Self-pay

## 2020-02-17 ENCOUNTER — Ambulatory Visit (HOSPITAL_COMMUNITY)
Admission: RE | Admit: 2020-02-17 | Discharge: 2020-02-17 | Disposition: A | Discharge status: Home / Self Care | Payer: Medicare Other | Source: Ambulatory Visit | Attending: Family Medicine | Admitting: Family Medicine

## 2020-02-17 ENCOUNTER — Other Ambulatory Visit (HOSPITAL_COMMUNITY): Payer: Self-pay | Admitting: Family Medicine

## 2020-02-17 DIAGNOSIS — Z23 Encounter for immunization: Secondary | ICD-10-CM | POA: Diagnosis not present

## 2020-02-17 DIAGNOSIS — I10 Essential (primary) hypertension: Secondary | ICD-10-CM | POA: Diagnosis not present

## 2020-02-17 DIAGNOSIS — E7849 Other hyperlipidemia: Secondary | ICD-10-CM | POA: Diagnosis not present

## 2020-02-17 DIAGNOSIS — Z1389 Encounter for screening for other disorder: Secondary | ICD-10-CM

## 2020-02-17 DIAGNOSIS — Z6825 Body mass index (BMI) 25.0-25.9, adult: Secondary | ICD-10-CM | POA: Diagnosis not present

## 2020-02-17 DIAGNOSIS — M546 Pain in thoracic spine: Secondary | ICD-10-CM | POA: Diagnosis not present

## 2020-02-17 DIAGNOSIS — R7309 Other abnormal glucose: Secondary | ICD-10-CM | POA: Diagnosis not present

## 2020-02-17 DIAGNOSIS — D649 Anemia, unspecified: Secondary | ICD-10-CM | POA: Diagnosis not present

## 2020-02-17 DIAGNOSIS — Z1331 Encounter for screening for depression: Secondary | ICD-10-CM | POA: Diagnosis not present

## 2020-02-17 DIAGNOSIS — M549 Dorsalgia, unspecified: Secondary | ICD-10-CM | POA: Diagnosis not present

## 2020-02-17 DIAGNOSIS — Z0001 Encounter for general adult medical examination with abnormal findings: Secondary | ICD-10-CM | POA: Diagnosis not present

## 2020-02-17 DIAGNOSIS — E039 Hypothyroidism, unspecified: Secondary | ICD-10-CM | POA: Diagnosis not present

## 2020-02-17 DIAGNOSIS — J45909 Unspecified asthma, uncomplicated: Secondary | ICD-10-CM | POA: Diagnosis not present

## 2020-02-17 DIAGNOSIS — Z125 Encounter for screening for malignant neoplasm of prostate: Secondary | ICD-10-CM | POA: Diagnosis not present

## 2020-02-17 DIAGNOSIS — Z952 Presence of prosthetic heart valve: Secondary | ICD-10-CM | POA: Diagnosis not present

## 2020-02-27 DIAGNOSIS — Z23 Encounter for immunization: Secondary | ICD-10-CM | POA: Diagnosis not present

## 2020-03-11 ENCOUNTER — Other Ambulatory Visit (HOSPITAL_COMMUNITY): Payer: Self-pay | Admitting: Family Medicine

## 2020-03-11 ENCOUNTER — Ambulatory Visit (HOSPITAL_COMMUNITY)
Admission: RE | Admit: 2020-03-11 | Discharge: 2020-03-11 | Disposition: A | Discharge status: Home / Self Care | Payer: Medicare Other | Source: Ambulatory Visit | Attending: Family Medicine | Admitting: Family Medicine

## 2020-03-11 ENCOUNTER — Other Ambulatory Visit: Payer: Self-pay

## 2020-03-11 DIAGNOSIS — E663 Overweight: Secondary | ICD-10-CM | POA: Diagnosis not present

## 2020-03-11 DIAGNOSIS — M79675 Pain in left toe(s): Secondary | ICD-10-CM | POA: Diagnosis not present

## 2020-03-11 DIAGNOSIS — Z6825 Body mass index (BMI) 25.0-25.9, adult: Secondary | ICD-10-CM | POA: Diagnosis not present

## 2020-03-18 DIAGNOSIS — I10 Essential (primary) hypertension: Secondary | ICD-10-CM | POA: Diagnosis not present

## 2020-03-18 DIAGNOSIS — I519 Heart disease, unspecified: Secondary | ICD-10-CM | POA: Diagnosis not present

## 2020-03-18 DIAGNOSIS — I50812 Chronic right heart failure: Secondary | ICD-10-CM | POA: Diagnosis not present

## 2020-03-18 DIAGNOSIS — R0602 Shortness of breath: Secondary | ICD-10-CM | POA: Diagnosis not present

## 2020-03-18 DIAGNOSIS — I50813 Acute on chronic right heart failure: Secondary | ICD-10-CM | POA: Diagnosis not present

## 2020-03-18 DIAGNOSIS — I4819 Other persistent atrial fibrillation: Secondary | ICD-10-CM | POA: Diagnosis not present

## 2020-03-18 DIAGNOSIS — Q2547 Right aortic arch: Secondary | ICD-10-CM | POA: Diagnosis not present

## 2020-03-18 DIAGNOSIS — I472 Ventricular tachycardia: Secondary | ICD-10-CM | POA: Diagnosis not present

## 2020-03-18 DIAGNOSIS — Q213 Tetralogy of Fallot: Secondary | ICD-10-CM | POA: Diagnosis not present

## 2020-03-18 DIAGNOSIS — R42 Dizziness and giddiness: Secondary | ICD-10-CM | POA: Diagnosis not present

## 2020-03-18 DIAGNOSIS — E208 Other hypoparathyroidism: Secondary | ICD-10-CM | POA: Diagnosis not present

## 2020-03-19 DIAGNOSIS — E039 Hypothyroidism, unspecified: Secondary | ICD-10-CM | POA: Diagnosis not present

## 2020-03-19 DIAGNOSIS — I129 Hypertensive chronic kidney disease with stage 1 through stage 4 chronic kidney disease, or unspecified chronic kidney disease: Secondary | ICD-10-CM | POA: Diagnosis not present

## 2020-03-19 DIAGNOSIS — E7849 Other hyperlipidemia: Secondary | ICD-10-CM | POA: Diagnosis not present

## 2020-03-19 DIAGNOSIS — N182 Chronic kidney disease, stage 2 (mild): Secondary | ICD-10-CM | POA: Diagnosis not present

## 2020-03-26 DIAGNOSIS — J069 Acute upper respiratory infection, unspecified: Secondary | ICD-10-CM | POA: Diagnosis not present

## 2020-03-26 DIAGNOSIS — E441 Mild protein-calorie malnutrition: Secondary | ICD-10-CM | POA: Diagnosis not present

## 2020-04-06 DIAGNOSIS — K921 Melena: Secondary | ICD-10-CM | POA: Diagnosis not present

## 2020-04-06 DIAGNOSIS — I484 Atypical atrial flutter: Secondary | ICD-10-CM | POA: Diagnosis not present

## 2020-04-06 DIAGNOSIS — Z825 Family history of asthma and other chronic lower respiratory diseases: Secondary | ICD-10-CM | POA: Diagnosis not present

## 2020-04-06 DIAGNOSIS — I50810 Right heart failure, unspecified: Secondary | ICD-10-CM | POA: Diagnosis present

## 2020-04-06 DIAGNOSIS — I110 Hypertensive heart disease with heart failure: Secondary | ICD-10-CM | POA: Diagnosis present

## 2020-04-06 DIAGNOSIS — Z880 Allergy status to penicillin: Secondary | ICD-10-CM | POA: Diagnosis not present

## 2020-04-06 DIAGNOSIS — Z79899 Other long term (current) drug therapy: Secondary | ICD-10-CM | POA: Diagnosis not present

## 2020-04-06 DIAGNOSIS — Z952 Presence of prosthetic heart valve: Secondary | ICD-10-CM | POA: Diagnosis not present

## 2020-04-06 DIAGNOSIS — Q401 Congenital hiatus hernia: Secondary | ICD-10-CM | POA: Diagnosis not present

## 2020-04-06 DIAGNOSIS — Z8719 Personal history of other diseases of the digestive system: Secondary | ICD-10-CM | POA: Diagnosis not present

## 2020-04-06 DIAGNOSIS — Q213 Tetralogy of Fallot: Secondary | ICD-10-CM | POA: Diagnosis not present

## 2020-04-06 DIAGNOSIS — J45909 Unspecified asthma, uncomplicated: Secondary | ICD-10-CM | POA: Diagnosis present

## 2020-04-06 DIAGNOSIS — E039 Hypothyroidism, unspecified: Secondary | ICD-10-CM | POA: Diagnosis present

## 2020-04-06 DIAGNOSIS — Z20822 Contact with and (suspected) exposure to covid-19: Secondary | ICD-10-CM | POA: Diagnosis present

## 2020-04-06 DIAGNOSIS — N179 Acute kidney failure, unspecified: Secondary | ICD-10-CM | POA: Diagnosis present

## 2020-04-06 DIAGNOSIS — K761 Chronic passive congestion of liver: Secondary | ICD-10-CM | POA: Diagnosis present

## 2020-04-06 DIAGNOSIS — Z888 Allergy status to other drugs, medicaments and biological substances status: Secondary | ICD-10-CM | POA: Diagnosis not present

## 2020-04-06 DIAGNOSIS — K254 Chronic or unspecified gastric ulcer with hemorrhage: Secondary | ICD-10-CM | POA: Diagnosis present

## 2020-04-06 DIAGNOSIS — I482 Chronic atrial fibrillation, unspecified: Secondary | ICD-10-CM | POA: Diagnosis present

## 2020-04-06 DIAGNOSIS — K259 Gastric ulcer, unspecified as acute or chronic, without hemorrhage or perforation: Secondary | ICD-10-CM | POA: Diagnosis not present

## 2020-04-06 DIAGNOSIS — Z7901 Long term (current) use of anticoagulants: Secondary | ICD-10-CM | POA: Diagnosis not present

## 2020-04-06 DIAGNOSIS — D72829 Elevated white blood cell count, unspecified: Secondary | ICD-10-CM | POA: Insufficient documentation

## 2020-04-06 DIAGNOSIS — E119 Type 2 diabetes mellitus without complications: Secondary | ICD-10-CM | POA: Diagnosis present

## 2020-04-06 DIAGNOSIS — R5381 Other malaise: Secondary | ICD-10-CM | POA: Diagnosis not present

## 2020-04-06 DIAGNOSIS — I517 Cardiomegaly: Secondary | ICD-10-CM | POA: Diagnosis not present

## 2020-04-06 DIAGNOSIS — K920 Hematemesis: Secondary | ICD-10-CM | POA: Diagnosis not present

## 2020-04-06 DIAGNOSIS — R571 Hypovolemic shock: Secondary | ICD-10-CM | POA: Diagnosis present

## 2020-04-06 DIAGNOSIS — D649 Anemia, unspecified: Secondary | ICD-10-CM | POA: Diagnosis present

## 2020-04-06 DIAGNOSIS — I10 Essential (primary) hypertension: Secondary | ICD-10-CM | POA: Diagnosis not present

## 2020-04-06 DIAGNOSIS — J986 Disorders of diaphragm: Secondary | ICD-10-CM | POA: Diagnosis present

## 2020-04-06 DIAGNOSIS — K625 Hemorrhage of anus and rectum: Secondary | ICD-10-CM | POA: Diagnosis not present

## 2020-04-06 DIAGNOSIS — J9612 Chronic respiratory failure with hypercapnia: Secondary | ICD-10-CM | POA: Diagnosis present

## 2020-04-06 DIAGNOSIS — D821 Di George's syndrome: Secondary | ICD-10-CM | POA: Diagnosis present

## 2020-04-06 DIAGNOSIS — Z8774 Personal history of (corrected) congenital malformations of heart and circulatory system: Secondary | ICD-10-CM | POA: Diagnosis not present

## 2020-04-06 DIAGNOSIS — K449 Diaphragmatic hernia without obstruction or gangrene: Secondary | ICD-10-CM | POA: Diagnosis not present

## 2020-04-13 ENCOUNTER — Ambulatory Visit (INDEPENDENT_AMBULATORY_CARE_PROVIDER_SITE_OTHER): Payer: Medicare Other | Admitting: Podiatry

## 2020-04-13 ENCOUNTER — Other Ambulatory Visit: Payer: Self-pay

## 2020-04-13 DIAGNOSIS — R571 Hypovolemic shock: Secondary | ICD-10-CM | POA: Diagnosis not present

## 2020-04-13 DIAGNOSIS — B351 Tinea unguium: Secondary | ICD-10-CM

## 2020-04-13 DIAGNOSIS — E119 Type 2 diabetes mellitus without complications: Secondary | ICD-10-CM | POA: Diagnosis not present

## 2020-04-13 DIAGNOSIS — I484 Atypical atrial flutter: Secondary | ICD-10-CM | POA: Diagnosis not present

## 2020-04-13 DIAGNOSIS — J9612 Chronic respiratory failure with hypercapnia: Secondary | ICD-10-CM | POA: Diagnosis not present

## 2020-04-13 DIAGNOSIS — I50810 Right heart failure, unspecified: Secondary | ICD-10-CM | POA: Diagnosis not present

## 2020-04-13 DIAGNOSIS — I110 Hypertensive heart disease with heart failure: Secondary | ICD-10-CM | POA: Diagnosis not present

## 2020-04-13 DIAGNOSIS — M79675 Pain in left toe(s): Secondary | ICD-10-CM | POA: Diagnosis not present

## 2020-04-13 DIAGNOSIS — M1990 Unspecified osteoarthritis, unspecified site: Secondary | ICD-10-CM | POA: Diagnosis not present

## 2020-04-13 DIAGNOSIS — D649 Anemia, unspecified: Secondary | ICD-10-CM | POA: Diagnosis not present

## 2020-04-13 DIAGNOSIS — Z952 Presence of prosthetic heart valve: Secondary | ICD-10-CM | POA: Diagnosis not present

## 2020-04-13 DIAGNOSIS — Q213 Tetralogy of Fallot: Secondary | ICD-10-CM | POA: Diagnosis not present

## 2020-04-13 DIAGNOSIS — N179 Acute kidney failure, unspecified: Secondary | ICD-10-CM | POA: Diagnosis not present

## 2020-04-13 DIAGNOSIS — M79674 Pain in right toe(s): Secondary | ICD-10-CM | POA: Diagnosis not present

## 2020-04-13 DIAGNOSIS — Z9181 History of falling: Secondary | ICD-10-CM | POA: Diagnosis not present

## 2020-04-13 DIAGNOSIS — Z95818 Presence of other cardiac implants and grafts: Secondary | ICD-10-CM | POA: Diagnosis not present

## 2020-04-13 DIAGNOSIS — I4891 Unspecified atrial fibrillation: Secondary | ICD-10-CM | POA: Diagnosis not present

## 2020-04-13 DIAGNOSIS — J986 Disorders of diaphragm: Secondary | ICD-10-CM | POA: Diagnosis not present

## 2020-04-13 DIAGNOSIS — D72829 Elevated white blood cell count, unspecified: Secondary | ICD-10-CM | POA: Diagnosis not present

## 2020-04-13 DIAGNOSIS — K254 Chronic or unspecified gastric ulcer with hemorrhage: Secondary | ICD-10-CM | POA: Diagnosis not present

## 2020-04-13 DIAGNOSIS — E039 Hypothyroidism, unspecified: Secondary | ICD-10-CM | POA: Diagnosis not present

## 2020-04-13 DIAGNOSIS — J45909 Unspecified asthma, uncomplicated: Secondary | ICD-10-CM | POA: Diagnosis not present

## 2020-04-13 DIAGNOSIS — Z9981 Dependence on supplemental oxygen: Secondary | ICD-10-CM | POA: Diagnosis not present

## 2020-04-13 DIAGNOSIS — Z48815 Encounter for surgical aftercare following surgery on the digestive system: Secondary | ICD-10-CM | POA: Diagnosis not present

## 2020-04-13 NOTE — Progress Notes (Signed)
   SUBJECTIVE Patient presents to office today complaining of elongated, thickened nails that cause pain while ambulating in shoes.  He is unable to trim his own nails. Patient is here for further evaluation and treatment.  Past Medical History:  Diagnosis Date  . Arthritis   . Hypertension   . Hypothyroidism     OBJECTIVE General Patient is awake, alert, and oriented x 3 and in no acute distress. Derm Skin is dry and supple bilateral. Negative open lesions or macerations. Remaining integument unremarkable. Nails are tender, long, thickened and dystrophic with subungual debris, consistent with onychomycosis, 1-5 bilateral. No signs of infection noted. Vasc  DP and PT pedal pulses palpable bilaterally. Temperature gradient within normal limits.  Neuro Epicritic and protective threshold sensation grossly intact bilaterally.  Musculoskeletal Exam No symptomatic pedal deformities noted bilateral. Muscular strength within normal limits.  ASSESSMENT 1. Onychodystrophic nails 1-5 bilateral with hyperkeratosis of nails.  2. Onychomycosis of nail due to dermatophyte bilateral 3. Pain in foot bilateral  PLAN OF CARE 1. Patient evaluated today.  2. Instructed to maintain good pedal hygiene and foot care.  3. Mechanical debridement of nails 1-5 bilaterally performed using a nail nipper. Filed with dremel without incident.  4. Return to clinic in 3 mos.    Edrick Kins, DPM [facility]  Dr. Edrick Kins, [address]                                        [place], [place] [address]                Office [phone]  Fax [phone]

## 2020-04-15 DIAGNOSIS — D72829 Elevated white blood cell count, unspecified: Secondary | ICD-10-CM | POA: Diagnosis not present

## 2020-04-15 DIAGNOSIS — I484 Atypical atrial flutter: Secondary | ICD-10-CM | POA: Diagnosis not present

## 2020-04-15 DIAGNOSIS — R571 Hypovolemic shock: Secondary | ICD-10-CM | POA: Diagnosis not present

## 2020-04-15 DIAGNOSIS — I110 Hypertensive heart disease with heart failure: Secondary | ICD-10-CM | POA: Diagnosis not present

## 2020-04-15 DIAGNOSIS — N179 Acute kidney failure, unspecified: Secondary | ICD-10-CM | POA: Diagnosis not present

## 2020-04-15 DIAGNOSIS — K254 Chronic or unspecified gastric ulcer with hemorrhage: Secondary | ICD-10-CM | POA: Diagnosis not present

## 2020-04-16 DIAGNOSIS — R571 Hypovolemic shock: Secondary | ICD-10-CM | POA: Diagnosis not present

## 2020-04-16 DIAGNOSIS — Z6827 Body mass index (BMI) 27.0-27.9, adult: Secondary | ICD-10-CM | POA: Diagnosis not present

## 2020-04-16 DIAGNOSIS — N179 Acute kidney failure, unspecified: Secondary | ICD-10-CM | POA: Diagnosis not present

## 2020-04-16 DIAGNOSIS — K922 Gastrointestinal hemorrhage, unspecified: Secondary | ICD-10-CM | POA: Diagnosis not present

## 2020-04-16 DIAGNOSIS — Z1389 Encounter for screening for other disorder: Secondary | ICD-10-CM | POA: Diagnosis not present

## 2020-04-16 DIAGNOSIS — I484 Atypical atrial flutter: Secondary | ICD-10-CM | POA: Diagnosis not present

## 2020-04-16 DIAGNOSIS — I110 Hypertensive heart disease with heart failure: Secondary | ICD-10-CM | POA: Diagnosis not present

## 2020-04-16 DIAGNOSIS — E663 Overweight: Secondary | ICD-10-CM | POA: Diagnosis not present

## 2020-04-16 DIAGNOSIS — K254 Chronic or unspecified gastric ulcer with hemorrhage: Secondary | ICD-10-CM | POA: Diagnosis not present

## 2020-04-16 DIAGNOSIS — D72829 Elevated white blood cell count, unspecified: Secondary | ICD-10-CM | POA: Diagnosis not present

## 2020-04-18 DIAGNOSIS — I472 Ventricular tachycardia: Secondary | ICD-10-CM | POA: Diagnosis not present

## 2020-04-18 DIAGNOSIS — I4819 Other persistent atrial fibrillation: Secondary | ICD-10-CM | POA: Diagnosis not present

## 2020-04-18 DIAGNOSIS — Q213 Tetralogy of Fallot: Secondary | ICD-10-CM | POA: Diagnosis not present

## 2020-04-20 DIAGNOSIS — R571 Hypovolemic shock: Secondary | ICD-10-CM | POA: Diagnosis not present

## 2020-04-20 DIAGNOSIS — I110 Hypertensive heart disease with heart failure: Secondary | ICD-10-CM | POA: Diagnosis not present

## 2020-04-20 DIAGNOSIS — D72829 Elevated white blood cell count, unspecified: Secondary | ICD-10-CM | POA: Diagnosis not present

## 2020-04-20 DIAGNOSIS — I484 Atypical atrial flutter: Secondary | ICD-10-CM | POA: Diagnosis not present

## 2020-04-20 DIAGNOSIS — K254 Chronic or unspecified gastric ulcer with hemorrhage: Secondary | ICD-10-CM | POA: Diagnosis not present

## 2020-04-20 DIAGNOSIS — N179 Acute kidney failure, unspecified: Secondary | ICD-10-CM | POA: Diagnosis not present

## 2020-04-22 DIAGNOSIS — N179 Acute kidney failure, unspecified: Secondary | ICD-10-CM | POA: Diagnosis not present

## 2020-04-22 DIAGNOSIS — R571 Hypovolemic shock: Secondary | ICD-10-CM | POA: Diagnosis not present

## 2020-04-22 DIAGNOSIS — I110 Hypertensive heart disease with heart failure: Secondary | ICD-10-CM | POA: Diagnosis not present

## 2020-04-22 DIAGNOSIS — K254 Chronic or unspecified gastric ulcer with hemorrhage: Secondary | ICD-10-CM | POA: Diagnosis not present

## 2020-04-22 DIAGNOSIS — I484 Atypical atrial flutter: Secondary | ICD-10-CM | POA: Diagnosis not present

## 2020-04-22 DIAGNOSIS — D72829 Elevated white blood cell count, unspecified: Secondary | ICD-10-CM | POA: Diagnosis not present

## 2020-04-23 DIAGNOSIS — K254 Chronic or unspecified gastric ulcer with hemorrhage: Secondary | ICD-10-CM | POA: Diagnosis not present

## 2020-04-23 DIAGNOSIS — R571 Hypovolemic shock: Secondary | ICD-10-CM | POA: Diagnosis not present

## 2020-04-23 DIAGNOSIS — N179 Acute kidney failure, unspecified: Secondary | ICD-10-CM | POA: Diagnosis not present

## 2020-04-23 DIAGNOSIS — I484 Atypical atrial flutter: Secondary | ICD-10-CM | POA: Diagnosis not present

## 2020-04-23 DIAGNOSIS — I110 Hypertensive heart disease with heart failure: Secondary | ICD-10-CM | POA: Diagnosis not present

## 2020-04-23 DIAGNOSIS — D72829 Elevated white blood cell count, unspecified: Secondary | ICD-10-CM | POA: Diagnosis not present

## 2020-04-26 DIAGNOSIS — E079 Disorder of thyroid, unspecified: Secondary | ICD-10-CM | POA: Diagnosis present

## 2020-04-26 DIAGNOSIS — I50812 Chronic right heart failure: Secondary | ICD-10-CM | POA: Diagnosis present

## 2020-04-26 DIAGNOSIS — K254 Chronic or unspecified gastric ulcer with hemorrhage: Secondary | ICD-10-CM | POA: Diagnosis present

## 2020-04-26 DIAGNOSIS — Z7901 Long term (current) use of anticoagulants: Secondary | ICD-10-CM | POA: Diagnosis not present

## 2020-04-26 DIAGNOSIS — I10 Essential (primary) hypertension: Secondary | ICD-10-CM | POA: Diagnosis present

## 2020-04-26 DIAGNOSIS — D649 Anemia, unspecified: Secondary | ICD-10-CM | POA: Diagnosis present

## 2020-04-26 DIAGNOSIS — Z880 Allergy status to penicillin: Secondary | ICD-10-CM | POA: Diagnosis not present

## 2020-04-26 DIAGNOSIS — K2970 Gastritis, unspecified, without bleeding: Secondary | ICD-10-CM | POA: Diagnosis present

## 2020-04-26 DIAGNOSIS — Z9981 Dependence on supplemental oxygen: Secondary | ICD-10-CM | POA: Diagnosis not present

## 2020-04-26 DIAGNOSIS — R1084 Generalized abdominal pain: Secondary | ICD-10-CM | POA: Diagnosis not present

## 2020-04-26 DIAGNOSIS — E119 Type 2 diabetes mellitus without complications: Secondary | ICD-10-CM | POA: Diagnosis present

## 2020-04-26 DIAGNOSIS — K449 Diaphragmatic hernia without obstruction or gangrene: Secondary | ICD-10-CM | POA: Diagnosis present

## 2020-04-26 DIAGNOSIS — K3189 Other diseases of stomach and duodenum: Secondary | ICD-10-CM | POA: Diagnosis not present

## 2020-04-26 DIAGNOSIS — Z952 Presence of prosthetic heart valve: Secondary | ICD-10-CM | POA: Diagnosis not present

## 2020-04-26 DIAGNOSIS — K253 Acute gastric ulcer without hemorrhage or perforation: Secondary | ICD-10-CM | POA: Diagnosis not present

## 2020-04-26 DIAGNOSIS — Q213 Tetralogy of Fallot: Secondary | ICD-10-CM | POA: Diagnosis not present

## 2020-04-26 DIAGNOSIS — J986 Disorders of diaphragm: Secondary | ICD-10-CM | POA: Diagnosis present

## 2020-04-26 DIAGNOSIS — Z8774 Personal history of (corrected) congenital malformations of heart and circulatory system: Secondary | ICD-10-CM | POA: Diagnosis not present

## 2020-04-26 DIAGNOSIS — Z8249 Family history of ischemic heart disease and other diseases of the circulatory system: Secondary | ICD-10-CM | POA: Diagnosis not present

## 2020-04-26 DIAGNOSIS — T182XXA Foreign body in stomach, initial encounter: Secondary | ICD-10-CM | POA: Diagnosis not present

## 2020-04-26 DIAGNOSIS — J9612 Chronic respiratory failure with hypercapnia: Secondary | ICD-10-CM | POA: Diagnosis present

## 2020-04-26 DIAGNOSIS — Z888 Allergy status to other drugs, medicaments and biological substances status: Secondary | ICD-10-CM | POA: Diagnosis not present

## 2020-04-26 DIAGNOSIS — I484 Atypical atrial flutter: Secondary | ICD-10-CM | POA: Diagnosis not present

## 2020-04-26 DIAGNOSIS — K921 Melena: Secondary | ICD-10-CM | POA: Diagnosis not present

## 2020-04-26 DIAGNOSIS — Z20822 Contact with and (suspected) exposure to covid-19: Secondary | ICD-10-CM | POA: Diagnosis present

## 2020-04-26 DIAGNOSIS — Z825 Family history of asthma and other chronic lower respiratory diseases: Secondary | ICD-10-CM | POA: Diagnosis not present

## 2020-04-26 DIAGNOSIS — R109 Unspecified abdominal pain: Secondary | ICD-10-CM | POA: Diagnosis not present

## 2020-04-26 DIAGNOSIS — K259 Gastric ulcer, unspecified as acute or chronic, without hemorrhage or perforation: Secondary | ICD-10-CM | POA: Diagnosis not present

## 2020-04-26 DIAGNOSIS — R000 Tachycardia, unspecified: Secondary | ICD-10-CM | POA: Diagnosis not present

## 2020-04-26 DIAGNOSIS — I4819 Other persistent atrial fibrillation: Secondary | ICD-10-CM | POA: Diagnosis present

## 2020-04-26 DIAGNOSIS — R791 Abnormal coagulation profile: Secondary | ICD-10-CM | POA: Diagnosis present

## 2020-04-26 DIAGNOSIS — Z832 Family history of diseases of the blood and blood-forming organs and certain disorders involving the immune mechanism: Secondary | ICD-10-CM | POA: Diagnosis not present

## 2020-05-01 DIAGNOSIS — I484 Atypical atrial flutter: Secondary | ICD-10-CM | POA: Diagnosis not present

## 2020-05-01 DIAGNOSIS — R571 Hypovolemic shock: Secondary | ICD-10-CM | POA: Diagnosis not present

## 2020-05-01 DIAGNOSIS — I110 Hypertensive heart disease with heart failure: Secondary | ICD-10-CM | POA: Diagnosis not present

## 2020-05-01 DIAGNOSIS — D72829 Elevated white blood cell count, unspecified: Secondary | ICD-10-CM | POA: Diagnosis not present

## 2020-05-01 DIAGNOSIS — N179 Acute kidney failure, unspecified: Secondary | ICD-10-CM | POA: Diagnosis not present

## 2020-05-01 DIAGNOSIS — K254 Chronic or unspecified gastric ulcer with hemorrhage: Secondary | ICD-10-CM | POA: Diagnosis not present

## 2020-05-03 DIAGNOSIS — D72829 Elevated white blood cell count, unspecified: Secondary | ICD-10-CM | POA: Diagnosis not present

## 2020-05-03 DIAGNOSIS — K254 Chronic or unspecified gastric ulcer with hemorrhage: Secondary | ICD-10-CM | POA: Diagnosis not present

## 2020-05-03 DIAGNOSIS — N179 Acute kidney failure, unspecified: Secondary | ICD-10-CM | POA: Diagnosis not present

## 2020-05-03 DIAGNOSIS — I484 Atypical atrial flutter: Secondary | ICD-10-CM | POA: Diagnosis not present

## 2020-05-03 DIAGNOSIS — R571 Hypovolemic shock: Secondary | ICD-10-CM | POA: Diagnosis not present

## 2020-05-03 DIAGNOSIS — I110 Hypertensive heart disease with heart failure: Secondary | ICD-10-CM | POA: Diagnosis not present

## 2020-05-05 DIAGNOSIS — I110 Hypertensive heart disease with heart failure: Secondary | ICD-10-CM | POA: Diagnosis not present

## 2020-05-05 DIAGNOSIS — K254 Chronic or unspecified gastric ulcer with hemorrhage: Secondary | ICD-10-CM | POA: Diagnosis not present

## 2020-05-05 DIAGNOSIS — R571 Hypovolemic shock: Secondary | ICD-10-CM | POA: Diagnosis not present

## 2020-05-05 DIAGNOSIS — N179 Acute kidney failure, unspecified: Secondary | ICD-10-CM | POA: Diagnosis not present

## 2020-05-05 DIAGNOSIS — D72829 Elevated white blood cell count, unspecified: Secondary | ICD-10-CM | POA: Diagnosis not present

## 2020-05-05 DIAGNOSIS — I484 Atypical atrial flutter: Secondary | ICD-10-CM | POA: Diagnosis not present

## 2020-05-06 DIAGNOSIS — R571 Hypovolemic shock: Secondary | ICD-10-CM | POA: Diagnosis not present

## 2020-05-06 DIAGNOSIS — I110 Hypertensive heart disease with heart failure: Secondary | ICD-10-CM | POA: Diagnosis not present

## 2020-05-06 DIAGNOSIS — N179 Acute kidney failure, unspecified: Secondary | ICD-10-CM | POA: Diagnosis not present

## 2020-05-06 DIAGNOSIS — K254 Chronic or unspecified gastric ulcer with hemorrhage: Secondary | ICD-10-CM | POA: Diagnosis not present

## 2020-05-06 DIAGNOSIS — I484 Atypical atrial flutter: Secondary | ICD-10-CM | POA: Diagnosis not present

## 2020-05-06 DIAGNOSIS — D72829 Elevated white blood cell count, unspecified: Secondary | ICD-10-CM | POA: Diagnosis not present

## 2020-05-07 DIAGNOSIS — D649 Anemia, unspecified: Secondary | ICD-10-CM | POA: Diagnosis not present

## 2020-05-07 DIAGNOSIS — Q213 Tetralogy of Fallot: Secondary | ICD-10-CM | POA: Diagnosis not present

## 2020-05-07 DIAGNOSIS — K254 Chronic or unspecified gastric ulcer with hemorrhage: Secondary | ICD-10-CM | POA: Diagnosis not present

## 2020-05-07 DIAGNOSIS — Z6826 Body mass index (BMI) 26.0-26.9, adult: Secondary | ICD-10-CM | POA: Diagnosis not present

## 2020-05-10 DIAGNOSIS — D72829 Elevated white blood cell count, unspecified: Secondary | ICD-10-CM | POA: Diagnosis not present

## 2020-05-10 DIAGNOSIS — K254 Chronic or unspecified gastric ulcer with hemorrhage: Secondary | ICD-10-CM | POA: Diagnosis not present

## 2020-05-10 DIAGNOSIS — R571 Hypovolemic shock: Secondary | ICD-10-CM | POA: Diagnosis not present

## 2020-05-10 DIAGNOSIS — I484 Atypical atrial flutter: Secondary | ICD-10-CM | POA: Diagnosis not present

## 2020-05-10 DIAGNOSIS — I110 Hypertensive heart disease with heart failure: Secondary | ICD-10-CM | POA: Diagnosis not present

## 2020-05-10 DIAGNOSIS — N179 Acute kidney failure, unspecified: Secondary | ICD-10-CM | POA: Diagnosis not present

## 2020-05-12 DIAGNOSIS — I110 Hypertensive heart disease with heart failure: Secondary | ICD-10-CM | POA: Diagnosis not present

## 2020-05-12 DIAGNOSIS — D72829 Elevated white blood cell count, unspecified: Secondary | ICD-10-CM | POA: Diagnosis not present

## 2020-05-12 DIAGNOSIS — N179 Acute kidney failure, unspecified: Secondary | ICD-10-CM | POA: Diagnosis not present

## 2020-05-12 DIAGNOSIS — I484 Atypical atrial flutter: Secondary | ICD-10-CM | POA: Diagnosis not present

## 2020-05-12 DIAGNOSIS — K254 Chronic or unspecified gastric ulcer with hemorrhage: Secondary | ICD-10-CM | POA: Diagnosis not present

## 2020-05-12 DIAGNOSIS — R571 Hypovolemic shock: Secondary | ICD-10-CM | POA: Diagnosis not present

## 2020-05-13 DIAGNOSIS — Z9981 Dependence on supplemental oxygen: Secondary | ICD-10-CM | POA: Diagnosis not present

## 2020-05-13 DIAGNOSIS — J9612 Chronic respiratory failure with hypercapnia: Secondary | ICD-10-CM | POA: Diagnosis not present

## 2020-05-13 DIAGNOSIS — Z9181 History of falling: Secondary | ICD-10-CM | POA: Diagnosis not present

## 2020-05-13 DIAGNOSIS — K259 Gastric ulcer, unspecified as acute or chronic, without hemorrhage or perforation: Secondary | ICD-10-CM | POA: Diagnosis not present

## 2020-05-13 DIAGNOSIS — E119 Type 2 diabetes mellitus without complications: Secondary | ICD-10-CM | POA: Diagnosis not present

## 2020-05-13 DIAGNOSIS — Q213 Tetralogy of Fallot: Secondary | ICD-10-CM | POA: Diagnosis not present

## 2020-05-13 DIAGNOSIS — I110 Hypertensive heart disease with heart failure: Secondary | ICD-10-CM | POA: Diagnosis not present

## 2020-05-13 DIAGNOSIS — Z86010 Personal history of colonic polyps: Secondary | ICD-10-CM | POA: Diagnosis not present

## 2020-05-13 DIAGNOSIS — Z952 Presence of prosthetic heart valve: Secondary | ICD-10-CM | POA: Diagnosis not present

## 2020-05-13 DIAGNOSIS — K449 Diaphragmatic hernia without obstruction or gangrene: Secondary | ICD-10-CM | POA: Diagnosis not present

## 2020-05-13 DIAGNOSIS — E039 Hypothyroidism, unspecified: Secondary | ICD-10-CM | POA: Diagnosis not present

## 2020-05-13 DIAGNOSIS — D689 Coagulation defect, unspecified: Secondary | ICD-10-CM | POA: Diagnosis not present

## 2020-05-13 DIAGNOSIS — R000 Tachycardia, unspecified: Secondary | ICD-10-CM | POA: Diagnosis not present

## 2020-05-13 DIAGNOSIS — K5730 Diverticulosis of large intestine without perforation or abscess without bleeding: Secondary | ICD-10-CM | POA: Diagnosis not present

## 2020-05-13 DIAGNOSIS — J45909 Unspecified asthma, uncomplicated: Secondary | ICD-10-CM | POA: Diagnosis not present

## 2020-05-13 DIAGNOSIS — I484 Atypical atrial flutter: Secondary | ICD-10-CM | POA: Diagnosis not present

## 2020-05-13 DIAGNOSIS — J986 Disorders of diaphragm: Secondary | ICD-10-CM | POA: Diagnosis not present

## 2020-05-13 DIAGNOSIS — D649 Anemia, unspecified: Secondary | ICD-10-CM | POA: Diagnosis not present

## 2020-05-13 DIAGNOSIS — I4819 Other persistent atrial fibrillation: Secondary | ICD-10-CM | POA: Diagnosis not present

## 2020-05-13 DIAGNOSIS — I5032 Chronic diastolic (congestive) heart failure: Secondary | ICD-10-CM | POA: Diagnosis not present

## 2020-05-13 DIAGNOSIS — Z7901 Long term (current) use of anticoagulants: Secondary | ICD-10-CM | POA: Diagnosis not present

## 2020-05-13 DIAGNOSIS — K921 Melena: Secondary | ICD-10-CM | POA: Diagnosis not present

## 2020-05-13 DIAGNOSIS — Z95818 Presence of other cardiac implants and grafts: Secondary | ICD-10-CM | POA: Diagnosis not present

## 2020-05-13 DIAGNOSIS — I50810 Right heart failure, unspecified: Secondary | ICD-10-CM | POA: Diagnosis not present

## 2020-05-14 DIAGNOSIS — Q213 Tetralogy of Fallot: Secondary | ICD-10-CM | POA: Diagnosis not present

## 2020-05-14 DIAGNOSIS — I4819 Other persistent atrial fibrillation: Secondary | ICD-10-CM | POA: Diagnosis not present

## 2020-05-14 DIAGNOSIS — E208 Other hypoparathyroidism: Secondary | ICD-10-CM | POA: Diagnosis not present

## 2020-05-18 DIAGNOSIS — I5032 Chronic diastolic (congestive) heart failure: Secondary | ICD-10-CM | POA: Diagnosis not present

## 2020-05-18 DIAGNOSIS — I50810 Right heart failure, unspecified: Secondary | ICD-10-CM | POA: Diagnosis not present

## 2020-05-18 DIAGNOSIS — K921 Melena: Secondary | ICD-10-CM | POA: Diagnosis not present

## 2020-05-18 DIAGNOSIS — I110 Hypertensive heart disease with heart failure: Secondary | ICD-10-CM | POA: Diagnosis not present

## 2020-05-18 DIAGNOSIS — I484 Atypical atrial flutter: Secondary | ICD-10-CM | POA: Diagnosis not present

## 2020-05-18 DIAGNOSIS — K259 Gastric ulcer, unspecified as acute or chronic, without hemorrhage or perforation: Secondary | ICD-10-CM | POA: Diagnosis not present

## 2020-05-20 DIAGNOSIS — K259 Gastric ulcer, unspecified as acute or chronic, without hemorrhage or perforation: Secondary | ICD-10-CM | POA: Diagnosis not present

## 2020-05-20 DIAGNOSIS — I50810 Right heart failure, unspecified: Secondary | ICD-10-CM | POA: Diagnosis not present

## 2020-05-20 DIAGNOSIS — I484 Atypical atrial flutter: Secondary | ICD-10-CM | POA: Diagnosis not present

## 2020-05-20 DIAGNOSIS — K921 Melena: Secondary | ICD-10-CM | POA: Diagnosis not present

## 2020-05-20 DIAGNOSIS — I110 Hypertensive heart disease with heart failure: Secondary | ICD-10-CM | POA: Diagnosis not present

## 2020-05-20 DIAGNOSIS — I5032 Chronic diastolic (congestive) heart failure: Secondary | ICD-10-CM | POA: Diagnosis not present

## 2020-05-25 DIAGNOSIS — I110 Hypertensive heart disease with heart failure: Secondary | ICD-10-CM | POA: Diagnosis not present

## 2020-05-25 DIAGNOSIS — I484 Atypical atrial flutter: Secondary | ICD-10-CM | POA: Diagnosis not present

## 2020-05-25 DIAGNOSIS — K259 Gastric ulcer, unspecified as acute or chronic, without hemorrhage or perforation: Secondary | ICD-10-CM | POA: Diagnosis not present

## 2020-05-25 DIAGNOSIS — I50810 Right heart failure, unspecified: Secondary | ICD-10-CM | POA: Diagnosis not present

## 2020-05-25 DIAGNOSIS — I5032 Chronic diastolic (congestive) heart failure: Secondary | ICD-10-CM | POA: Diagnosis not present

## 2020-05-25 DIAGNOSIS — K921 Melena: Secondary | ICD-10-CM | POA: Diagnosis not present

## 2020-05-26 DIAGNOSIS — K921 Melena: Secondary | ICD-10-CM | POA: Diagnosis not present

## 2020-05-26 DIAGNOSIS — I50810 Right heart failure, unspecified: Secondary | ICD-10-CM | POA: Diagnosis not present

## 2020-05-26 DIAGNOSIS — I5032 Chronic diastolic (congestive) heart failure: Secondary | ICD-10-CM | POA: Diagnosis not present

## 2020-05-26 DIAGNOSIS — I110 Hypertensive heart disease with heart failure: Secondary | ICD-10-CM | POA: Diagnosis not present

## 2020-05-26 DIAGNOSIS — K259 Gastric ulcer, unspecified as acute or chronic, without hemorrhage or perforation: Secondary | ICD-10-CM | POA: Diagnosis not present

## 2020-05-26 DIAGNOSIS — I484 Atypical atrial flutter: Secondary | ICD-10-CM | POA: Diagnosis not present

## 2020-06-02 DIAGNOSIS — I5032 Chronic diastolic (congestive) heart failure: Secondary | ICD-10-CM | POA: Diagnosis not present

## 2020-06-02 DIAGNOSIS — I50810 Right heart failure, unspecified: Secondary | ICD-10-CM | POA: Diagnosis not present

## 2020-06-02 DIAGNOSIS — K259 Gastric ulcer, unspecified as acute or chronic, without hemorrhage or perforation: Secondary | ICD-10-CM | POA: Diagnosis not present

## 2020-06-02 DIAGNOSIS — K921 Melena: Secondary | ICD-10-CM | POA: Diagnosis not present

## 2020-06-02 DIAGNOSIS — I484 Atypical atrial flutter: Secondary | ICD-10-CM | POA: Diagnosis not present

## 2020-06-02 DIAGNOSIS — I110 Hypertensive heart disease with heart failure: Secondary | ICD-10-CM | POA: Diagnosis not present

## 2020-06-03 ENCOUNTER — Observation Stay (HOSPITAL_COMMUNITY)
Admission: EM | Admit: 2020-06-03 | Discharge: 2020-06-04 | Disposition: A | Discharge status: Home / Self Care | Payer: Medicare Other | Attending: Family Medicine | Admitting: Family Medicine

## 2020-06-03 ENCOUNTER — Other Ambulatory Visit: Payer: Self-pay

## 2020-06-03 ENCOUNTER — Encounter (HOSPITAL_COMMUNITY): Payer: Self-pay

## 2020-06-03 DIAGNOSIS — Z7901 Long term (current) use of anticoagulants: Secondary | ICD-10-CM | POA: Diagnosis not present

## 2020-06-03 DIAGNOSIS — E119 Type 2 diabetes mellitus without complications: Secondary | ICD-10-CM | POA: Diagnosis not present

## 2020-06-03 DIAGNOSIS — I50813 Acute on chronic right heart failure: Secondary | ICD-10-CM | POA: Diagnosis not present

## 2020-06-03 DIAGNOSIS — Z20822 Contact with and (suspected) exposure to covid-19: Secondary | ICD-10-CM | POA: Insufficient documentation

## 2020-06-03 DIAGNOSIS — R112 Nausea with vomiting, unspecified: Secondary | ICD-10-CM | POA: Diagnosis not present

## 2020-06-03 DIAGNOSIS — Z91040 Latex allergy status: Secondary | ICD-10-CM | POA: Insufficient documentation

## 2020-06-03 DIAGNOSIS — D62 Acute posthemorrhagic anemia: Secondary | ICD-10-CM | POA: Diagnosis not present

## 2020-06-03 DIAGNOSIS — I110 Hypertensive heart disease with heart failure: Secondary | ICD-10-CM | POA: Diagnosis not present

## 2020-06-03 DIAGNOSIS — R1110 Vomiting, unspecified: Secondary | ICD-10-CM | POA: Diagnosis present

## 2020-06-03 DIAGNOSIS — Z951 Presence of aortocoronary bypass graft: Secondary | ICD-10-CM | POA: Diagnosis not present

## 2020-06-03 DIAGNOSIS — I10 Essential (primary) hypertension: Secondary | ICD-10-CM | POA: Diagnosis present

## 2020-06-03 DIAGNOSIS — I4819 Other persistent atrial fibrillation: Secondary | ICD-10-CM | POA: Insufficient documentation

## 2020-06-03 DIAGNOSIS — E039 Hypothyroidism, unspecified: Secondary | ICD-10-CM | POA: Insufficient documentation

## 2020-06-03 DIAGNOSIS — J45909 Unspecified asthma, uncomplicated: Secondary | ICD-10-CM | POA: Insufficient documentation

## 2020-06-03 DIAGNOSIS — Z7982 Long term (current) use of aspirin: Secondary | ICD-10-CM | POA: Insufficient documentation

## 2020-06-03 DIAGNOSIS — R197 Diarrhea, unspecified: Secondary | ICD-10-CM

## 2020-06-03 DIAGNOSIS — Z79899 Other long term (current) drug therapy: Secondary | ICD-10-CM | POA: Insufficient documentation

## 2020-06-03 DIAGNOSIS — K921 Melena: Secondary | ICD-10-CM | POA: Diagnosis present

## 2020-06-03 DIAGNOSIS — K529 Noninfective gastroenteritis and colitis, unspecified: Principal | ICD-10-CM | POA: Insufficient documentation

## 2020-06-03 DIAGNOSIS — D696 Thrombocytopenia, unspecified: Secondary | ICD-10-CM | POA: Insufficient documentation

## 2020-06-03 DIAGNOSIS — K922 Gastrointestinal hemorrhage, unspecified: Secondary | ICD-10-CM

## 2020-06-03 HISTORY — DX: Unspecified asthma, uncomplicated: J45.909

## 2020-06-03 HISTORY — DX: Type 2 diabetes mellitus without complications: E11.9

## 2020-06-03 HISTORY — DX: Personal history of peptic ulcer disease: Z87.11

## 2020-06-03 HISTORY — DX: Heart disease, unspecified: I51.9

## 2020-06-03 MED ORDER — SODIUM CHLORIDE 0.9 % IV BOLUS
1000.0000 mL | Freq: Once | INTRAVENOUS | Status: AC
  Administered 2020-06-03: 1000 mL via INTRAVENOUS

## 2020-06-03 MED ORDER — FAMOTIDINE IN NACL 20-0.9 MG/50ML-% IV SOLN
20.0000 mg | INTRAVENOUS | Status: AC
  Administered 2020-06-03: 20 mg via INTRAVENOUS
  Filled 2020-06-03: qty 50

## 2020-06-03 MED ORDER — PANTOPRAZOLE SODIUM 40 MG IV SOLR
40.0000 mg | INTRAVENOUS | Status: AC
  Administered 2020-06-03: 40 mg via INTRAVENOUS
  Filled 2020-06-03: qty 40

## 2020-06-03 NOTE — ED Provider Notes (Signed)
[facility] EMERGENCY DEPARTMENT Provider Note   CSN: [id number] Arrival date & time: 06/03/20  2140     History Chief Complaint  Patient presents with  . Emesis    Jay Skinner is a 60 y.o. male.  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  He has some intellectual delay, he is able to live by himself, cares for himself but does not drive and has never had a job.  He has been cared for by family throughout his life.  Evidently the family has had some nausea vomiting and diarrhea recently, he has not really been around them.  Over the course of today the patient has developed multiple episodes of vomiting and diarrhea, he states that the diarrhea looks black, family members and friends have reported to me that he has recently been admitted to an outside hospital because of what appeared to be gastrointestinal bleeding  I have reviewed the medical record, the patient is treated with Eliquis, he was recently admitted to [facility] and underwent upper endoscopy for gastrointestinal bleeding and melena, he has a known history of atrial fibrillation which is why he takes Eliquis, he had ventricular tachycardia status post ablation and has had 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  He had a gastric ulcer that was seen on esophagogastroduodenoscopy, it was clipped, he then had 3 days of melena.  Hemoglobin was 10.9, MCV was 108, INR was 1.5.  He had a CT abdomen and pelvis occult GI bleed protocol which did not show any evidence of active GI bleeding, a repeat EGD was performed on April 27, 2020 which showed gastritis without active bleeding, the ulcer had been healed appropriately and hemoglobin  remained stable.  He was continued on Eliquis and pantoprazole 40 mg twice a day which was to be indefinite.  The patient reports to me multiple episodes of vomiting and diarrhea today.  Level 5 caveat applies secondary to the patient's intellectual delay and inability to give clear history and details  Past Medical History:  Diagnosis Date  . Arthritis   . Asthma   . Diabetes mellitus without complication ([place])   . Heart disease   . History of bleeding ulcers   . Hypertension   . Hypothyroidism     Patient Active Problem List   Diagnosis Date Noted  . AKI (acute kidney injury) ([place]) 04/06/2020  . Atypical atrial flutter ([place]) 04/06/2020  . Gastrointestinal hemorrhage associated with gastric ulcer 04/06/2020  . Leukocytosis 04/06/2020  . Other hypoparathyroidism ([place]) 06/25/2019  . Moderate pulmonary valve regurgitation 05/29/2019  . 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 Diuretic-induced hypokalemia 05/17/2019  . Intensive care unit patient on diuretic therapy 05/17/2019  . Urinary retention 05/17/2019  . Acute respiratory failure with hypoxia and hypercapnia ([place]) 05/16/2019  . Hypercarbia 05/16/2019  . Respiratory acidosis 05/16/2019  . Acute on chronic right heart failure ([place]) 05/13/2019  . Edema of left lower extremity 01/07/2019  . Effusion of left knee 04/02/2018  . Persistent atrial fibrillation ([place]) 11/06/2017  . Post-traumatic osteoarthritis of one knee 12/23/2013  . Stiffness of knee joint 05/30/2012  . Difficulty in walking(719.7) 05/30/2012  . Left leg weakness 05/30/2012  . H/O ventricular tachycardia 08/08/2011  . History  of tricuspid valve annuloplasty 08/08/2011  . HTN (hypertension) 08/08/2011  . Left knee pain 08/08/2011  . Pulmonary insufficiency 08/08/2011  . 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 ABDOMINAL SURGERY    . CARDIAC VALVE REPLACEMENT     [facility]   . COLONOSCOPY  N/A 08/04/2015   Procedure: COLONOSCOPY;  Surgeon: Rogene Houston, MD;  Location: [facility];  Service: Endoscopy;  Laterality: N/A;  730  . CORONARY ARTERY BYPASS GRAFT    . LEG SURGERY Left    2008 , [facility]       Family History  Problem Relation Age of Onset  . Colon cancer Other     Social History   Tobacco Use  . Smoking status: Never Smoker  Substance Use Topics  . Alcohol use: No  . Drug use: No    Home Medications Prior to Admission medications   Medication Sig Start Date End Date Taking? Authorizing Provider  apixaban (ELIQUIS) 5 MG TABS tablet Take by mouth. 04/16/20   [provider]  aspirin EC 81 MG tablet Take 1 tablet (81 mg total) by mouth daily. 08/05/15   Rehman, Mechele Dawley, MD  azithromycin (ZITHROMAX) 250 MG tablet Take by mouth. 03/26/20   [provider]  benzonatate (TESSALON) 200 MG capsule Take by mouth. 03/26/20   [provider]  bisacodyl (DULCOLAX) 5 MG EC tablet Take by mouth.    [provider]  bumetanide (BUMEX) 2 MG tablet Take by mouth. 04/10/20 04/17/20  [provider]  calcitRIOL (ROCALTROL) 0.25 MCG capsule Take 0.25 mcg by mouth daily.    [provider]  calcium carbonate (TUMS - DOSED IN MG ELEMENTAL CALCIUM) 500 MG chewable tablet Chew 3 tablets by mouth daily.    [provider]  Cholecalciferol 50 MCG (2000 UT) TABS Take by mouth. 10/03/19   [provider]  clindamycin (CLEOCIN) 300 MG capsule Patient takes before dental work 04/07/15   [provider]  hydrochlorothiazide (MICROZIDE) 12.5 MG capsule Take 12.5 mg by mouth daily.    [provider]  levothyroxine (SYNTHROID, LEVOTHROID) 25 MCG tablet Take 25 mcg by mouth daily before breakfast.    [provider]  lisinopril (PRINIVIL,ZESTRIL) 10 MG tablet Take 10 mg by mouth daily.    [provider]  polyethylene glycol-electrolytes (NULYTELY/GOLYTELY) 420 g solution Take 4,000  mLs by mouth once. 06/21/15   Setzer, Rona Ravens, NP  potassium chloride SA (K-DUR,KLOR-CON) 20 MEQ tablet Take 1 tablet by mouth daily.  05/11/15   [provider]  torsemide (DEMADEX) 20 MG tablet Take 20 mg by mouth every other day.    [provider]    Allergies    Codeine, Lasix [furosemide], Latex, and Penicillins  Review of Systems   Review of Systems  Unable to perform ROS: Other    Physical Exam Updated Vital Signs BP (!) 158/70 (BP Location: Right Arm)   Pulse 75   Temp 98.5 F (36.9 C) (Oral)   Resp (!) 24   Ht 1.626 m (5\' 4" )   Wt 66.2 kg   SpO2 98%   BMI 25.05 kg/m   Physical Exam Vitals and nursing note reviewed.  Constitutional:      General: He is not in acute distress.    Appearance: He is well-developed.  HENT:     Head: Normocephalic and atraumatic.     Mouth/Throat:     Pharynx: No oropharyngeal  exudate.  Eyes:     General: No scleral icterus.       Right eye: No discharge.        Left eye: No discharge.     Conjunctiva/sclera: Conjunctivae normal.     Pupils: Pupils are equal, round, and reactive to light.  Neck:     Thyroid: No thyromegaly.     Vascular: No JVD.  Cardiovascular:     Rate and Rhythm: Normal rate and regular rhythm.     Heart sounds: Murmur heard.  No friction rub. No gallop.   Pulmonary:     Effort: Pulmonary effort is normal. No respiratory distress.     Breath sounds: Normal breath sounds. No wheezing or rales.  Abdominal:     General: There is no distension.     Palpations: Abdomen is soft. There is no mass.     Tenderness: There is abdominal tenderness.     Comments: Mild abdominal tenderness in the mid abdomen, easily reducible ventral hernia, increased bowel sounds  Musculoskeletal:        General: No tenderness. Normal range of motion.     Cervical back: Normal range of motion and neck supple.  Lymphadenopathy:     Cervical: No cervical adenopathy.  Skin:    General: Skin is warm and dry.      Findings: No erythema or rash.  Neurological:     General: No focal deficit present.     Mental Status: He is alert.     Coordination: Coordination normal.     Comments: The patient is not very good about giving details in history, he says the same things over and over especially regarding nausea vomiting and diarrhea, answers other questions very simply but appropriately  Psychiatric:        Behavior: Behavior normal.     ED Results / Procedures / Treatments   Labs (all labs ordered are listed, but only abnormal results are displayed) Labs Reviewed  RESP PANEL BY RT-PCR (FLU A&B, COVID) ARPGX2  CBC WITH DIFFERENTIAL/PLATELET  COMPREHENSIVE METABOLIC PANEL  OCCULT BLOOD GASTRIC / DUODENUM (SPECIMEN CUP)  APTT  PROTIME-INR  VITAMIN B12  FOLATE  IRON AND TIBC  FERRITIN  RETICULOCYTES  LIPASE, BLOOD  POC OCCULT BLOOD, ED    EKG None  Radiology No results found.  Procedures Procedures   Medications Ordered in ED Medications  sodium chloride 0.9 % bolus 1,000 mL (has no administration in time range)  famotidine (PEPCID) IVPB 20 mg premix (has no administration in time range)  pantoprazole (PROTONIX) injection 40 mg (has no administration in time range)    ED Course  I have reviewed the triage vital signs and the nursing notes.  Pertinent labs & imaging results that were available during my care of the patient were reviewed by me and considered in my medical decision making (see chart for details).    MDM Rules/Calculators/A&P                          The patient is normotensive, he has a blood pressure of 158/70 with a heart rate of 75, he has no respiratory symptoms, he has had vomiting and diarrhea with what he describes as melanotic looking stools.  We will check a Hemoccult, labs, he may need to be admitted if he is having an ongoing slow upper GI bleed.  Will order Protonix, Pepcid  At change of shift care signed out to oncoming emergency department physician  to follow-up results and disposition accordingly.  Final Clinical Impression(s) / ED Diagnoses Final diagnoses:  None    Rx / DC Orders ED Discharge Orders    None       Noemi Chapel, MD 06/05/20 1645

## 2020-06-03 NOTE — ED Triage Notes (Addendum)
Pt here pov from home with emesis all day. Unable to keep anything down. Family states his bp is lower than usual too .  Also has bleeding ulcers   Family has recently gotten over a stomach bug.

## 2020-06-04 ENCOUNTER — Encounter (HOSPITAL_COMMUNITY): Payer: Self-pay | Admitting: Internal Medicine

## 2020-06-04 DIAGNOSIS — K922 Gastrointestinal hemorrhage, unspecified: Secondary | ICD-10-CM

## 2020-06-04 DIAGNOSIS — K921 Melena: Secondary | ICD-10-CM | POA: Diagnosis not present

## 2020-06-04 DIAGNOSIS — R112 Nausea with vomiting, unspecified: Secondary | ICD-10-CM

## 2020-06-04 DIAGNOSIS — I159 Secondary hypertension, unspecified: Secondary | ICD-10-CM

## 2020-06-04 DIAGNOSIS — R197 Diarrhea, unspecified: Secondary | ICD-10-CM

## 2020-06-04 DIAGNOSIS — K529 Noninfective gastroenteritis and colitis, unspecified: Secondary | ICD-10-CM

## 2020-06-04 DIAGNOSIS — D62 Acute posthemorrhagic anemia: Secondary | ICD-10-CM

## 2020-06-04 DIAGNOSIS — E039 Hypothyroidism, unspecified: Secondary | ICD-10-CM | POA: Diagnosis present

## 2020-06-04 DIAGNOSIS — E119 Type 2 diabetes mellitus without complications: Secondary | ICD-10-CM

## 2020-06-04 DIAGNOSIS — D696 Thrombocytopenia, unspecified: Secondary | ICD-10-CM

## 2020-06-04 DIAGNOSIS — I4819 Other persistent atrial fibrillation: Secondary | ICD-10-CM

## 2020-06-04 LAB — RESP PANEL BY RT-PCR (FLU A&B, COVID) ARPGX2
Influenza A by PCR: NEGATIVE
Influenza B by PCR: NEGATIVE
SARS Coronavirus 2 by RT PCR: NEGATIVE

## 2020-06-04 LAB — VITAMIN B12: Vitamin B-12: 353 pg/mL (ref 180–914)

## 2020-06-04 LAB — CBC WITH DIFFERENTIAL/PLATELET
Abs Immature Granulocytes: 0.02 10*3/uL (ref 0.00–0.07)
Basophils Absolute: 0 10*3/uL (ref 0.0–0.1)
Basophils Relative: 1 %
Eosinophils Absolute: 0.2 10*3/uL (ref 0.0–0.5)
Eosinophils Relative: 3 %
HCT: 33.5 % — ABNORMAL LOW (ref 39.0–52.0)
Hemoglobin: 11.4 g/dL — ABNORMAL LOW (ref 13.0–17.0)
Immature Granulocytes: 0 %
Lymphocytes Relative: 20 %
Lymphs Abs: 1 10*3/uL (ref 0.7–4.0)
MCH: 37.3 pg — ABNORMAL HIGH (ref 26.0–34.0)
MCHC: 34 g/dL (ref 30.0–36.0)
MCV: 109.5 fL — ABNORMAL HIGH (ref 80.0–100.0)
Monocytes Absolute: 0.6 10*3/uL (ref 0.1–1.0)
Monocytes Relative: 12 %
Neutro Abs: 3.3 10*3/uL (ref 1.7–7.7)
Neutrophils Relative %: 64 %
Platelets: 104 10*3/uL — ABNORMAL LOW (ref 150–400)
RBC: 3.06 MIL/uL — ABNORMAL LOW (ref 4.22–5.81)
RDW: 14.3 % (ref 11.5–15.5)
WBC: 5.1 10*3/uL (ref 4.0–10.5)
nRBC: 0 % (ref 0.0–0.2)

## 2020-06-04 LAB — COMPREHENSIVE METABOLIC PANEL
ALT: 12 U/L (ref 0–44)
AST: 24 U/L (ref 15–41)
Albumin: 3.8 g/dL (ref 3.5–5.0)
Alkaline Phosphatase: 59 U/L (ref 38–126)
Anion gap: 10 (ref 5–15)
BUN: 19 mg/dL (ref 6–20)
CO2: 36 mmol/L — ABNORMAL HIGH (ref 22–32)
Calcium: 7.9 mg/dL — ABNORMAL LOW (ref 8.9–10.3)
Chloride: 94 mmol/L — ABNORMAL LOW (ref 98–111)
Creatinine, Ser: 1.29 mg/dL — ABNORMAL HIGH (ref 0.61–1.24)
GFR, Estimated: >60 mL/min (ref >60)
Glucose, Bld: 122 mg/dL — ABNORMAL HIGH (ref 70–99)
Potassium: 4.1 mmol/L (ref 3.5–5.1)
Sodium: 140 mmol/L (ref 135–145)
Total Bilirubin: 0.4 mg/dL (ref 0.3–1.2)
Total Protein: 7.6 g/dL (ref 6.5–8.1)

## 2020-06-04 LAB — IRON AND TIBC
Iron: 43 ug/dL — ABNORMAL LOW (ref 45–182)
Saturation Ratios: 11 % — ABNORMAL LOW (ref 17.9–39.5)
TIBC: 384 ug/dL (ref 250–450)
UIBC: 341 ug/dL

## 2020-06-04 LAB — BASIC METABOLIC PANEL
Anion gap: 9 (ref 5–15)
BUN: 17 mg/dL (ref 6–20)
CO2: 36 mmol/L — ABNORMAL HIGH (ref 22–32)
Calcium: 7.8 mg/dL — ABNORMAL LOW (ref 8.9–10.3)
Chloride: 96 mmol/L — ABNORMAL LOW (ref 98–111)
Creatinine, Ser: 1.19 mg/dL (ref 0.61–1.24)
GFR, Estimated: >60 mL/min (ref >60)
Glucose, Bld: 130 mg/dL — ABNORMAL HIGH (ref 70–99)
Potassium: 4.6 mmol/L (ref 3.5–5.1)
Sodium: 141 mmol/L (ref 135–145)

## 2020-06-04 LAB — RETICULOCYTES
Immature Retic Fract: 21.7 % — ABNORMAL HIGH (ref 2.3–15.9)
RBC.: 3.16 MIL/uL — ABNORMAL LOW (ref 4.22–5.81)
Retic Count, Absolute: 60.7 10*3/uL (ref 19.0–186.0)
Retic Ct Pct: 1.9 % (ref 0.4–3.1)

## 2020-06-04 LAB — HEMOGLOBIN AND HEMATOCRIT, BLOOD
HCT: 36.1 % — ABNORMAL LOW (ref 39.0–52.0)
HCT: 37.5 % — ABNORMAL LOW (ref 39.0–52.0)
Hemoglobin: 11.4 g/dL — ABNORMAL LOW (ref 13.0–17.0)
Hemoglobin: 11.7 g/dL — ABNORMAL LOW (ref 13.0–17.0)

## 2020-06-04 LAB — PROTIME-INR
INR: 1.4 — ABNORMAL HIGH (ref 0.8–1.2)
Prothrombin Time: 16.8 seconds — ABNORMAL HIGH (ref 11.4–15.2)

## 2020-06-04 LAB — POC OCCULT BLOOD, ED: Fecal Occult Bld: POSITIVE — AB

## 2020-06-04 LAB — FERRITIN: Ferritin: 18 ng/mL — ABNORMAL LOW (ref 24–336)

## 2020-06-04 LAB — LIPASE, BLOOD: Lipase: 26 U/L (ref 11–51)

## 2020-06-04 LAB — FOLATE: Folate: 17.9 ng/mL (ref >5.9)

## 2020-06-04 LAB — HIV ANTIBODY (ROUTINE TESTING W REFLEX): HIV Screen 4th Generation wRfx: NONREACTIVE

## 2020-06-04 LAB — APTT: aPTT: 27 seconds (ref 24–36)

## 2020-06-04 MED ORDER — LACTATED RINGERS IV SOLN: INTRAVENOUS | Status: DC

## 2020-06-04 MED ORDER — PANTOPRAZOLE SODIUM 40 MG IV SOLR
40.0000 mg | Freq: Two times a day (BID) | INTRAVENOUS | Status: DC
  Administered 2020-06-04: 40 mg via INTRAVENOUS
  Filled 2020-06-04: qty 40

## 2020-06-04 NOTE — ED Provider Notes (Signed)
Patient signed out to me by Dr. Sabra Heck.  Patient brought in to the ED because of nausea, vomiting and diarrhea.  Patient has a history of upper GI bleeding recently secondary to gastric ulcer, gastritis and Eliquis use.  He is feeling better now, no further vomiting, no stool output.  He did have melanotic stools earlier at arrival.  Patient had endoscopy with his first GI bleed that revealed an ulcer that was clipped.  He was kept on his Eliquis and had additional bleeding, repeat EGD showed only gastritis, no interventions.  Patient now having evidence of continued mild upper GI bleeding.  As he continues to be on the Eliquis, will recommend observation.  Vital signs have been stable.  No hypotension.  Hemoglobin above 11, reassuring.   Orpah Greek, MD 06/04/20 [phone]

## 2020-06-04 NOTE — Discharge Instructions (Signed)
Please hold (DO NOT TAKE) apixaban (Eliquis) until you discuss GI bleeding with your heart doctor and GI doctors.       Gastrointestinal Bleeding Gastrointestinal (GI) bleeding is bleeding somewhere along the path that food travels through the body (digestive tract). This path is anywhere between the mouth and the opening of the butt (anus). You may have blood in your poop (stool) or have black poop. If you throw up (vomit), there may be blood in it. This condition can be mild, serious, or even life-threatening. If you have a lot of bleeding, you may need to stay in the hospital. What are the causes? This condition may be caused by:  Irritation and swelling of the esophagus (esophagitis). The esophagus is part of the body that moves food from your mouth to your stomach.  Swollen veins in the butt (hemorrhoids).  Areas of painful tearing in the opening of the butt (anal fissures). These are often caused by passing hard poop.  Pouches that form on the colon over time (diverticulosis).  Irritation and swelling (diverticulitis) in areas where pouches have formed on the colon.  Growths (polyps) or cancer. Colon cancer often starts out as growths that are not cancer.  Irritation of the stomach lining (gastritis).  Sores (ulcers) in the stomach. What increases the risk? You are more likely to develop this condition if you:  Have a certain type of infection in your stomach (Helicobacter pylori infection).  Take certain medicines.  Smoke.  Drink alcohol. What are the signs or symptoms? Common symptoms of this condition include:  Throwing up (vomiting) material that has bright red blood in it. It may look like coffee grounds.  Changes in your poop. The poop may: ? Have red blood in it. ? Be black, look like tar, and smell stronger than normal. ? Be red.  Pain or cramping in the belly (abdomen). How is this treated? Treatment for this condition depends on the cause of the  bleeding. For example:  Sometimes, the bleeding can be stopped during a procedure that is done to find the problem (endoscopy or colonoscopy).  Medicines can be used to: ? Help control irritation, swelling, or infection. ? Reduce acid in your stomach.  Certain problems can be treated with: ? Creams. ? Medicines that are put in the butt (suppositories). ? Warm baths.  Surgery is sometimes needed.  If you lose a lot of blood, you may need a blood transfusion. If bleeding is mild, you may be allowed to go home. If there is a lot of bleeding, you will need to stay in the hospital. Follow these instructions at home:  Take over-the-counter and prescription medicines only as told by your doctor.  Eat foods that have a lot of fiber in them. These foods include beans, whole grains, and fresh fruits and vegetables. You can also try eating 1-3 prunes each day.  Drink enough fluid to keep your pee (urine) pale yellow.  Keep all follow-up visits as told by your doctor. This is important.   Contact a doctor if:  Your symptoms do not get better. Get help right away if:  Your bleeding does not stop.  You feel dizzy or you pass out (faint).  You feel weak.  You have very bad cramps in your back or belly.  You pass large clumps of blood (clots) in your poop.  Your symptoms are getting worse.  You have chest pain or fast heartbeats. Summary  GI bleeding is bleeding somewhere along the path  that food travels through the body (digestive tract).  This bleeding can be caused by many things. Treatment depends on the cause of the bleeding.  Take medicines only as told by your doctor.  Keep all follow-up visits as told by your doctor. This is important. This information is not intended to replace advice given to you by your health care provider. Make sure you discuss any questions you have with your health care provider. Document Revised: 10/17/2017 Document Reviewed: 10/17/2017 Elsevier  Patient Education  2021 [employer].    IMPORTANT INFORMATION: PAY CLOSE ATTENTION   PHYSICIAN DISCHARGE INSTRUCTIONS  Follow with Primary care provider  Sharilyn Sites, MD  and other consultants as instructed by your Hospitalist Physician  [place] IF SYMPTOMS COME BACK, WORSEN OR NEW PROBLEM DEVELOPS   Please note: You were cared for by a hospitalist during your hospital stay. Every effort will be made to forward records to your primary care provider.  You can request that your primary care provider send for your hospital records if they have not received them.  Once you are discharged, your primary care physician will handle any further medical issues. Please note that NO REFILLS for any discharge medications will be authorized once you are discharged, as it is imperative that you return to your primary care physician (or establish a relationship with a primary care physician if you do not have one) for your post hospital discharge needs so that they can reassess your need for medications and monitor your lab values.  Please get a complete blood count and chemistry panel checked by your Primary MD at your next visit, and again as instructed by your Primary MD.  Get Medicines reviewed and adjusted: Please take all your medications with you for your next visit with your Primary MD  Laboratory/radiological data: Please request your Primary MD to go over all hospital tests and procedure/radiological results at the follow up, please ask your primary care provider to get all Hospital records sent to his/her office.  In some cases, they will be blood work, cultures and biopsy results pending at the time of your discharge. Please request that your primary care provider follow up on these results.  If you are diabetic, please bring your blood sugar readings with you to your follow up appointment with primary care.    Please call and make your follow up  appointments as soon as possible.    Also Note the following: If you experience worsening of your admission symptoms, develop shortness of breath, life threatening emergency, suicidal or homicidal thoughts you must seek medical attention immediately by calling 911 or calling your MD immediately  if symptoms less severe.  You must read complete instructions/literature along with all the possible adverse reactions/side effects for all the Medicines you take and that have been prescribed to you. Take any new Medicines after you have completely understood and accpet all the possible adverse reactions/side effects.   Do not drive when taking Pain medications or sleeping medications (Benzodiazepines)  Do not take more than prescribed Pain, Sleep and Anxiety Medications. It is not advisable to combine anxiety,sleep and pain medications without talking with your primary care practitioner  Special Instructions: If you have smoked or chewed Tobacco  in the last 2 yrs please stop smoking, stop any regular Alcohol  and or any Recreational drug use.  Wear Seat belts while driving.  Do not drive if taking any narcotic, mind altering or controlled substances  or recreational drugs or alcohol.

## 2020-06-04 NOTE — ED Notes (Signed)
Denies n/v

## 2020-06-04 NOTE — Consult Note (Signed)
Referring Provider: [facility] Primary Care Physician:  Sharilyn Sites, MD Primary Gastroenterologist:  Dr. Laural Golden  Date of Admission: 06/03/20 Date of Consultation: 06/04/20  Reason for Consultation:  Previous/healed ulcer, melena, improved hgb, N/V/D  HPI:  Jay Skinner is a 60 y.o. male with a past medical history of 60 year old male with a medical history significant for asthma, permanent A. fib on Eliquis anticoagulation, history of right heart failure, hypertension, hypothyroidism, recent PUD with GI bleed secondary to peptic ulcers and gastritis came to the ER for nausea, vomiting, diarrhea.  Recently family members had a "stomach bug".  Noted melena for 1 week.  No fever or chills.  No chest pain.  Initial vitals were stable in the emergency department.  He was given IV Protonix x1, Pepcid IV piggyback, and a saline bolus.  In the ED fecal occult blood positive.  Hemoglobin increased from recent 10.3, now 11.4.  Noted recent admission to [facility] for upper GI bleed finding gastric ulcer status post clipping and gastritis and associated 3 days of washout melena.  CT of the abdomen pelvis occult GI bleed protocol at that time did not show evidence of active bleeding.  Repeat EGD 04/27/2020 noted gastritis without bleeding, ulcer healed, hemoglobin remained stable.  Most recent CBC at [facility] outpatient visit on 05/14/2020 found to be 11.8.  In the ED also noted multiple episodes of vomiting and diarrhea.  On reassessment in the ED he was feeling better with no further vomiting and no stool output.  Today he states he is feeling better today. No N/V or diarrhea recently, since being in the ED. Dark stools for the past week, not every bowel movement was dark, only about half of them (3-4 total based on his assertion that he has a bowel movement daily). Denies any abdominal pain at this time. He is hungry and wants to eat. States he sees [facility] on  Tuesday, sees [facility] 06/25/20. No other overt GI complaints.  Past Medical History:  Diagnosis Date   Arthritis    Asthma    Heart disease    History of bleeding ulcers    Hypertension    Hypothyroidism     Past Surgical History:  Procedure Laterality Date   ABDOMINAL SURGERY     CARDIAC VALVE REPLACEMENT     [facility]    COLONOSCOPY N/A 08/04/2015   Procedure: COLONOSCOPY;  Surgeon: Rogene Houston, MD;  Location: [facility];  Service: Endoscopy;  Laterality: N/A;  29   CORONARY ARTERY BYPASS GRAFT     LEG SURGERY Left    2008 , [facility]    Prior to Admission medications   Medication Sig Start Date End Date Taking? Authorizing Provider  apixaban (ELIQUIS) 5 MG TABS tablet Take by mouth. 04/16/20   [provider]  aspirin EC 81 MG tablet Take 1 tablet (81 mg total) by mouth daily. 08/05/15   Rehman, Mechele Dawley, MD  azithromycin (ZITHROMAX) 250 MG tablet Take by mouth. 03/26/20   [provider]  bisacodyl (DULCOLAX) 5 MG EC tablet Take by mouth.    [provider]  bumetanide (BUMEX) 2 MG tablet Take by mouth. 04/10/20 04/17/20  [provider]  calcitRIOL (ROCALTROL) 0.25 MCG capsule Take 0.25 mcg by mouth daily.    [provider]  calcium carbonate (TUMS - DOSED IN MG ELEMENTAL CALCIUM) 500 MG chewable tablet Chew 3 tablets by mouth daily.    [provider]  Cholecalciferol  50 MCG (2000 UT) TABS Take by mouth. 10/03/19   [provider]  clindamycin (CLEOCIN) 300 MG capsule Patient takes before dental work 04/07/15   [provider]  hydrochlorothiazide (MICROZIDE) 12.5 MG capsule Take 12.5 mg by mouth daily.    [provider]  levothyroxine (SYNTHROID, LEVOTHROID) 25 MCG tablet Take 25 mcg by mouth daily before breakfast.    [provider]  lisinopril (PRINIVIL,ZESTRIL) 10 MG tablet Take 10 mg by mouth daily.    [provider]  polyethylene glycol-electrolytes  (NULYTELY/GOLYTELY) 420 g solution Take 4,000 mLs by mouth once. 06/21/15   Setzer, Rona Ravens, NP  potassium chloride SA (K-DUR,KLOR-CON) 20 MEQ tablet Take 1 tablet by mouth daily.  05/11/15   [provider]  torsemide (DEMADEX) 20 MG tablet Take 20 mg by mouth every other day.    [provider]    Current Facility-Administered Medications  Medication Dose Route Frequency Provider Last Rate Last Admin   lactated ringers infusion   Intravenous Continuous Reubin Milan, MD 100 mL/hr at 06/04/20 0518 Infusion Verify at 06/04/20 0518   pantoprazole (PROTONIX) injection 40 mg  40 mg Intravenous Q12H Reubin Milan, MD       Current Outpatient Medications  Medication Sig Dispense Refill   apixaban (ELIQUIS) 5 MG TABS tablet Take by mouth.     aspirin EC 81 MG tablet Take 1 tablet (81 mg total) by mouth daily.     azithromycin (ZITHROMAX) 250 MG tablet Take by mouth.     bisacodyl (DULCOLAX) 5 MG EC tablet Take by mouth.     bumetanide (BUMEX) 2 MG tablet Take by mouth.     calcitRIOL (ROCALTROL) 0.25 MCG capsule Take 0.25 mcg by mouth daily.     calcium carbonate (TUMS - DOSED IN MG ELEMENTAL CALCIUM) 500 MG chewable tablet Chew 3 tablets by mouth daily.     Cholecalciferol 50 MCG (2000 UT) TABS Take by mouth.     clindamycin (CLEOCIN) 300 MG capsule Patient takes before dental work     hydrochlorothiazide (MICROZIDE) 12.5 MG capsule Take 12.5 mg by mouth daily.     levothyroxine (SYNTHROID, LEVOTHROID) 25 MCG tablet Take 25 mcg by mouth daily before breakfast.     lisinopril (PRINIVIL,ZESTRIL) 10 MG tablet Take 10 mg by mouth daily.     polyethylene glycol-electrolytes (NULYTELY/GOLYTELY) 420 g solution Take 4,000 mLs by mouth once. 4000 mL 0   potassium chloride SA (K-DUR,KLOR-CON) 20 MEQ tablet Take 1 tablet by mouth daily.      torsemide (DEMADEX) 20 MG tablet Take 20 mg by mouth every other day.      Allergies as of 06/03/2020 - Review Complete  06/03/2020  Allergen Reaction Noted   Codeine Hives 08/04/2015   Lasix [furosemide] Hives 08/04/2015   Latex Other (See Comments) 04/13/2020   Penicillins Dermatitis 08/04/2015    Family History  Problem Relation Age of Onset   Colon cancer Other     Social History   Socioeconomic History   Marital status: Single    Spouse name: Not on file   Number of children: Not on file   Years of education: Not on file   Highest education level: Not on file  Occupational History   Not on file  Tobacco Use   Smoking status: Never Smoker   Smokeless tobacco: Not on file  Substance and Sexual Activity   Alcohol use: No   Drug use: No   Sexual activity: Not on file  Other Topics Concern   Not on file  Social History Narrative   Not on file   Social Determinants of Health   Financial Resource Strain: Not on file  Food Insecurity: Not on file  Transportation Needs: Not on file  Physical Activity: Not on file  Stress: Not on file  Social Connections: Not on file  Intimate Partner Violence: Not on file    Review of Systems: LIMITED DUE TO DISABILITY General: Negative for fever, chills. ENT: Negative for hoarseness, difficulty swallowing. CV: Negative for chest pain, angina, palpitations, peripheral edema.  Respiratory: Negative for dyspnea at rest, cough, sputum, wheezing.  GI: See history of present illness. Endo: Negative for unusual weight change.  Heme: Negative for bruising or bleeding. Allergy: Negative for rash or hives.  Physical Exam: Vital signs in last 24 hours: Temp:  [98.5 F (36.9 C)-98.6 F (37 C)] 98.5 F (36.9 C) (03/17 2245) Pulse Rate:  [72-80] 80 (03/18 0730) Resp:  [16-24] 24 (03/18 0730) BP: (114-158)/(66-88) 116/69 (03/18 0730) SpO2:  [90 %-100 %] 94 % (03/18 0730) Weight:  [66.2 kg] 66.2 kg (03/17 2145)   General:   Alert,  Well-developed, well-nourished, pleasant and cooperative in NAD Head:  Normocephalic and  atraumatic. Eyes:  Sclera clear, no icterus. Conjunctiva pink. Ears:  Normal auditory acuity. Neck:  Supple; no masses or thyromegaly. Lungs:  Clear throughout to auscultation. No wheezes, crackles, or rhonchi. No acute distress. Heart:  Regular rate and rhythm; No clicks, rubs, or gallops. Noted 4/6 murmur. Abdomen:  Soft, nontender and nondistended. No masses, hepatosplenomegaly or hernias noted. Normal bowel sounds, without guarding, and without rebound.   Rectal:  Deferred.   Msk:  Symmetrical without gross deformities. Pulses:  Normal bilateral DP pulses noted. Extremities:  Without clubbing or edema. Neurologic:  Alert and  oriented x4;  grossly normal neurologically. Psych:  Alert and cooperative. Normal mood and affect.  Intake/Output from previous day: 03/17 0701 - 03/18 0700 In: 1271.3 [I.V.:271.3; IV Piggyback:1000] Out: -  Intake/Output this shift: Total I/O In: -  Out: 100 [Urine:100]  Lab Results: Recent Labs    06/03/20 2345 06/04/20 0627  WBC 5.1  --   HGB 11.4* 11.4*  HCT 33.5* 36.1*  PLT 104*  --    BMET Recent Labs    06/03/20 2345  NA 140  K 4.1  CL 94*  CO2 36*  GLUCOSE 122*  BUN 19  CREATININE 1.29*  CALCIUM 7.9*   LFT Recent Labs    06/03/20 2345  PROT 7.6  ALBUMIN 3.8  AST 24  ALT 12  ALKPHOS 59  BILITOT 0.4   PT/INR Recent Labs    06/03/20 2345  LABPROT 16.8*  INR 1.4*   Hepatitis Panel No results for input(s): HEPBSAG, HCVAB, HEPAIGM, HEPBIGM in the last 72 hours. C-Diff No results for input(s): CDIFFTOX in the last 72 hours.  Studies/Results: No results found.  Impression: 60 year old male presents for nausea, vomiting, diarrhea recently spread amongst his family members as well deemed likely viral gastroenteritis.  He also is having melena earlier.  Recent admission to [facility] with noted gastric ulcer and gastritis responsible for upper GI bleed status post treatment and clipping.  Repeat EGD  04/27/2020 found healed ulcer, no further evidence of active bleeding.  His hemoglobin is trended upward respectively.  His last hemoglobin was 11.8 as an outpatient, found to be 11.4 today.  He is on Eliquis, last dose was yesterday morning (24 hours ago); currently being  held.  Anemia, melena, likely upper GI bleed: Cannot rule out slow, ongoing upper GI bleed, possibly due to gastritis.  Doubt large-volume upper GI bleed given no hematemesis or coffee-ground emesis.  Differentials could be quite broad including gastritis, esophagitis, duodenitis, recurrent peptic ulcer disease, Mallory-Weiss tear with associated nausea and vomiting due to likely viral gastroenteritis.  Nausea, vomiting, diarrhea: At recheck in the emergency department last night the patient was feeling better after treatment.  Today he is feeling much improved. No N/V/D since ER. Estimate 3-4 dark stools in the past week. Has follow-up with [facility] 06/25/20 (per the patient) and [facility] on Tuesday.  Plan: 1. Protonix 40 mg bid 2. Monitor for further melena 3. Trend hgb 4. Will need to follow-up with outpatient GI at [facility] (4/8 per patient) 5. Can feed patient regular diet 6. Supportive care   Discussed with Dr. Jenetta Downer. No need for urgent/IP endoscopy at this time. We will sign off for now, will remove consult order. Please contact us and enter a new consult order if we can be of further assistance.   Thank you for allowing [place] to participate in the care of [place], DNP, AGNP-C Adult & Gerontological Nurse Practitioner [facility]   LOS: 0 days     06/04/2020, 7:55 AM

## 2020-06-04 NOTE — Plan of Care (Signed)

## 2020-06-04 NOTE — Discharge Summary (Signed)
Physician Discharge Summary  Jay Skinner DOB: Oct 14, 1960 DOA: 06/03/2020  PCP: Sharilyn Sites, MD GI : at [facility] Cardiology: at [place] date: 06/03/2020 Discharge date: 06/04/2020  Admitted From:  Home  Disposition: Home   Recommendations for Outpatient Follow-up:  1. Follow up with cardiology at [facility] on Tues as scheduled 3/22 2. Follow up with GI at [facility] on 4/8 for EGD as already scheduled 3. Follow up with PCP as scheduled 4. Please obtain CBC in 1-2 weeks  Discharge Condition: STABLE   CODE STATUS: FULL  DIET: resume prior home diet   Brief Hospitalization Summary: Please see all hospital notes, images, labs for full details of the hospitalization. ADMISSION HPI: Jay Skinner is a 60 y.o. male with medical history significant of osteoarthritis, asthma, permanent atrial fibrillation, history of right heart failure, hypertension, hypothyroidism, PUD with history of GI bleeding secondary to bleeding peptic ulcers and gastritis who is brought to the emergency department due to multiple episodes of emesis and diarrhea earlier in the day associated with hypotension.  All other family members, heart recently being affected by a similar symptomatology.  He also stated that he has been having melena for about a week now.  Denies dysuria, frequency or hematuria.  He denies fever, chills, headache, sore throat, rhinorrhea, wheezing or hemoptysis.  No chest pain, palpitations, diaphoresis, PND, orthopnea or pitting edema of the lower extremities.  No polyuria, polydipsia, polyphagia or blurred vision.  ED Course: Initial vital signs were temperature 98.6 F, pulse 75, respirations 16, BP 150/75 mmHg O2 sat 100% on 2 LPM via nasal cannula.  The patient received pantoprazole 40 mg IVP x1, Pepcid 20 mg IVPB and 1000 mL of normal saline bolus.  Labwork: Fecal occult blood was positive.  PT is 16.8, INR 1.4 PTT 27.  Lipase was normal.  CBC shows a white count of 5.1, hemoglobin  11.4 g/dL with an MCV of 109.5 fL and platelets of 104.  Hospital course  Pt was admitted for observation for concern of upper GI bleeding.  He did have symptoms of a viral gastroenteritis that had been through his family but his symptoms have resolved now.  He tested hemoccult positive.  His apixaban was placed on  Hold.  He was seen by GI service but patient says he is already scheduled to see his cardiologist at [facility] on 3/22 and to see his GI at [facility] on 4/8 for planned EGD and would prefer to have everything done at [facility].  Inpatient GI recommended continuing BID protonix and close outpatient follow up with GI at [facility].  We have done serial Hg testing and it has remained stable at 11.8.  He is feeling better and wants to go home.  He is stable to discharge home now with close outpatient follow up.  Pt advised to stop taking apixaban until he has discussed GI bleed with cardiology and GI at [facility] and they tell him that it is safe to restart it.  He verbalized understanding.    Discharge Diagnoses:  Principal Problem:   Acute gastroenteritis Active Problems:   HTN (hypertension)   Persistent atrial fibrillation (HCC)   Melena   Acute blood loss anemia   Hypothyroidism   Type 2 diabetes mellitus (HCC)   Thrombocytopenia (HCC)   Gastrointestinal hemorrhage   Nausea vomiting and diarrhea   Discharge Instructions:  Allergies as of 06/04/2020      Reactions   Codeine Hives   Lasix [furosemide] Hives   Latex  Other (See Comments)   Penicillins Dermatitis      Medication List    STOP taking these medications   apixaban 5 MG Tabs tablet Commonly known as: ELIQUIS   polyethylene glycol-electrolytes 420 g solution Commonly known as: NuLYTELY     TAKE these medications   acetaminophen 325 MG tablet Commonly known as: TYLENOL Take 650 mg by mouth every 4 (four) hours as needed for pain.   amiodarone 200 MG tablet Commonly known as: PACERONE Take 400 mg by mouth daily.   bisacodyl 5  MG EC tablet Commonly known as: DULCOLAX Take 5 mg by mouth daily as needed for mild constipation.   bumetanide 2 MG tablet Commonly known as: BUMEX Take 2 mg by mouth daily.   calcitRIOL 0.25 MCG capsule Commonly known as: ROCALTROL Take 0.25 mcg by mouth daily.   calcium carbonate 500 MG chewable tablet Commonly known as: TUMS - dosed in mg elemental calcium Chew 3 tablets by mouth daily.   clindamycin 300 MG capsule Commonly known as: CLEOCIN Take 300 mg by mouth as needed (Prior to dental work).   D3 High Potency 50 MCG (2000 UT) Caps Generic drug: Cholecalciferol Take 2,000 Units by mouth daily.   diclofenac Sodium 1 % Gel Commonly known as: VOLTAREN Apply 1 application topically 2 (two) times daily as needed (knee pain).   DSS 100 MG Caps Take 200 mg by mouth at bedtime.   ferrous sulfate 325 (65 FE) MG EC tablet Take 325 mg by mouth daily.   levothyroxine 50 MCG tablet Commonly known as: SYNTHROID Take 50 mcg by mouth daily.   meclizine 12.5 MG tablet Commonly known as: ANTIVERT Take 12.5 mg by mouth 3 (three) times daily as needed for dizziness.   pantoprazole 40 MG tablet Commonly known as: PROTONIX Take 40 mg by mouth 2 (two) times daily.   potassium chloride SA 20 MEQ tablet Commonly known as: KLOR-CON Take 1 tablet by mouth daily.   spironolactone 25 MG tablet Commonly known as: ALDACTONE Take 25 mg by mouth daily.       Follow-up Information    Sharilyn Sites, MD Follow up.   Specialty: Family Medicine Contact information: [address] [place] [address] [phone]        Mikle Bosworth, MD. Go on 06/08/2020.   Specialty: Cardiology Why: as scheduled  Contact information: [place] [address] [phone]        GI at [facility]. Go on 06/25/2020.   Why: as scheduled              Allergies  Allergen Reactions  . Codeine Hives  . Lasix [Furosemide] Hives  . Latex Other (See Comments)  .  Penicillins Dermatitis   Allergies as of 06/04/2020      Reactions   Codeine Hives   Lasix [furosemide] Hives   Latex Other (See Comments)   Penicillins Dermatitis      Medication List    STOP taking these medications   apixaban 5 MG Tabs tablet Commonly known as: ELIQUIS   polyethylene glycol-electrolytes 420 g solution Commonly known as: NuLYTELY     TAKE these medications   acetaminophen 325 MG tablet Commonly known as: TYLENOL Take 650 mg by mouth every 4 (four) hours as needed for pain.   amiodarone 200 MG tablet Commonly known as: PACERONE Take 400 mg by mouth daily.   bisacodyl 5 MG EC tablet Commonly known as: DULCOLAX Take 5 mg by mouth daily as needed  for mild constipation.   bumetanide 2 MG tablet Commonly known as: BUMEX Take 2 mg by mouth daily.   calcitRIOL 0.25 MCG capsule Commonly known as: ROCALTROL Take 0.25 mcg by mouth daily.   calcium carbonate 500 MG chewable tablet Commonly known as: TUMS - dosed in mg elemental calcium Chew 3 tablets by mouth daily.   clindamycin 300 MG capsule Commonly known as: CLEOCIN Take 300 mg by mouth as needed (Prior to dental work).   D3 High Potency 50 MCG (2000 UT) Caps Generic drug: Cholecalciferol Take 2,000 Units by mouth daily.   diclofenac Sodium 1 % Gel Commonly known as: VOLTAREN Apply 1 application topically 2 (two) times daily as needed (knee pain).   DSS 100 MG Caps Take 200 mg by mouth at bedtime.   ferrous sulfate 325 (65 FE) MG EC tablet Take 325 mg by mouth daily.   levothyroxine 50 MCG tablet Commonly known as: SYNTHROID Take 50 mcg by mouth daily.   meclizine 12.5 MG tablet Commonly known as: ANTIVERT Take 12.5 mg by mouth 3 (three) times daily as needed for dizziness.   pantoprazole 40 MG tablet Commonly known as: PROTONIX Take 40 mg by mouth 2 (two) times daily.   potassium chloride SA 20 MEQ tablet Commonly known as: KLOR-CON Take 1 tablet by mouth daily.    spironolactone 25 MG tablet Commonly known as: ALDACTONE Take 25 mg by mouth daily.      Procedures/Studies:  No results found.   Subjective: Pt has had no further bleeding and symptoms have resolved and he reports that he feels better, tolerating diet.   Discharge Exam: Vitals:   06/04/20 0730 06/04/20 0940  BP: 116/69 (!) 145/77  Pulse: 80 70  Resp: (!) 24 19  Temp: 98.5 F (36.9 C) 97.8 F (36.6 C)  SpO2: 94% 94%   Vitals:   06/04/20 0600 06/04/20 0700 06/04/20 0730 06/04/20 0940  BP: 130/77 (!) 145/77 116/69 (!) 145/77  Pulse: 74 77 80 70  Resp: (!) 23 (!) 24 (!) 24 19  Temp:   98.5 F (36.9 C) 97.8 F (36.6 C)  TempSrc:   Oral Oral  SpO2: 96% 90% 94% 94%  Weight:      Height:   5\' 4"  (1.626 m)    General: Pt is alert, awake, not in acute distress Cardiovascular: RRR, S1/S2 +, no rubs, no gallops Respiratory: CTA bilaterally, no wheezing, no rhonchi Abdominal: Soft, NT, ND, bowel sounds + Extremities: no edema, no cyanosis   The results of significant diagnostics from this hospitalization (including imaging, microbiology, ancillary and laboratory) are listed below for reference.     Microbiology: Recent Results (from the past 240 hour(s))  Resp Panel by RT-PCR (Flu A&B, Covid) Nasopharyngeal Swab     Status: None   Collection Time: 06/03/20 11:45 PM   Specimen: Nasopharyngeal Swab; Nasopharyngeal(NP) swabs in vial transport medium  Result Value Ref Range Status   SARS Coronavirus 2 by RT PCR NEGATIVE NEGATIVE Final    Comment: (NOTE) SARS-CoV-2 target nucleic acids are NOT DETECTED.  The SARS-CoV-2 RNA is generally detectable in upper respiratory specimens during the acute phase of infection. The lowest concentration of SARS-CoV-2 viral copies this assay can detect is 138 copies/mL. A negative result does not preclude SARS-Cov-2 infection and should not be used as the sole basis for treatment or other patient management decisions. A negative result  may occur with  improper specimen collection/handling, submission of specimen other than nasopharyngeal swab, presence of viral  mutation(s) within the areas targeted by this assay, and inadequate number of viral copies(<138 copies/mL). A negative result must be combined with clinical observations, patient history, and epidemiological information. The expected result is Negative.  Fact Sheet for Patients:  EntrepreneurPulse.com.au  Fact Sheet for Healthcare Providers:  IncredibleEmployment.be  This test is no t yet approved or cleared by the [place] FDA and  has been authorized for detection and/or diagnosis of SARS-CoV-2 by FDA under an Emergency Use Authorization (EUA). This EUA will remain  in effect (meaning this test can be used) for the duration of the COVID-19 declaration under Section 564(b)(1) of the Act, 21 U.S.C.section 360bbb-3(b)(1), unless the authorization is terminated  or revoked sooner.       Influenza A by PCR NEGATIVE NEGATIVE Final   Influenza B by PCR NEGATIVE NEGATIVE Final    Comment: (NOTE) The Xpert Xpress SARS-CoV-2/FLU/RSV plus assay is intended as an aid in the diagnosis of influenza from Nasopharyngeal swab specimens and should not be used as a sole basis for treatment. Nasal washings and aspirates are unacceptable for Xpert Xpress SARS-CoV-2/FLU/RSV testing.  Fact Sheet for Patients: EntrepreneurPulse.com.au  Fact Sheet for Healthcare Providers: IncredibleEmployment.be  This test is not yet approved or cleared by the [place] FDA and has been authorized for detection and/or diagnosis of SARS-CoV-2 by FDA under an Emergency Use Authorization (EUA). This EUA will remain in effect (meaning this test can be used) for the duration of the COVID-19 declaration under Section 564(b)(1) of the Act, 21 U.S.C. section 360bbb-3(b)(1), unless the authorization is terminated  or revoked.  Performed at [facility], [address], [place], [place] [address]      Labs: BNP (last 3 results) No results for input(s): BNP in the last 8760 hours. Basic Metabolic Panel: Recent Labs  Lab 06/03/20 2345  NA 140  K 4.1  CL 94*  CO2 36*  GLUCOSE 122*  BUN 19  CREATININE 1.29*  CALCIUM 7.9*   Liver Function Tests: Recent Labs  Lab 06/03/20 2345  AST 24  ALT 12  ALKPHOS 59  BILITOT 0.4  PROT 7.6  ALBUMIN 3.8   Recent Labs  Lab 06/03/20 2345  LIPASE 26   No results for input(s): AMMONIA in the last 168 hours. CBC: Recent Labs  Lab 06/03/20 2345 06/04/20 0627 06/04/20 1135  WBC 5.1  --   --   NEUTROABS 3.3  --   --   HGB 11.4* 11.4* 11.7*  HCT 33.5* 36.1* 37.5*  MCV 109.5*  --   --   PLT 104*  --   --    Cardiac Enzymes: No results for input(s): CKTOTAL, CKMB, CKMBINDEX, TROPONINI in the last 168 hours. BNP: Invalid input(s): POCBNP CBG: No results for input(s): GLUCAP in the last 168 hours. D-Dimer No results for input(s): DDIMER in the last 72 hours. Hgb A1c No results for input(s): HGBA1C in the last 72 hours. Lipid Profile No results for input(s): CHOL, HDL, LDLCALC, TRIG, CHOLHDL, LDLDIRECT in the last 72 hours. Thyroid function studies No results for input(s): TSH, T4TOTAL, T3FREE, THYROIDAB in the last 72 hours.  Invalid input(s): FREET3 Anemia work up Recent Labs    06/03/20 2345  VITAMINB12 353  FOLATE 17.9  FERRITIN 18*  TIBC 384  IRON 43*  RETICCTPCT 1.9   Urinalysis No results found for: COLORURINE, APPEARANCEUR, LABSPEC, [place], GLUCOSEU, HGBUR, BILIRUBINUR, KETONESUR, PROTEINUR, UROBILINOGEN, NITRITE, LEUKOCYTESUR Sepsis Labs Invalid input(s): PROCALCITONIN,  WBC,  LACTICIDVEN Microbiology Recent Results (from the past  240 hour(s))  Resp Panel by RT-PCR (Flu A&B, Covid) Nasopharyngeal Swab     Status: None   Collection Time: 06/03/20 11:45 PM   Specimen: Nasopharyngeal Swab; Nasopharyngeal(NP) swabs in  vial transport medium  Result Value Ref Range Status   SARS Coronavirus 2 by RT PCR NEGATIVE NEGATIVE Final    Comment: (NOTE) SARS-CoV-2 target nucleic acids are NOT DETECTED.  The SARS-CoV-2 RNA is generally detectable in upper respiratory specimens during the acute phase of infection. The lowest concentration of SARS-CoV-2 viral copies this assay can detect is 138 copies/mL. A negative result does not preclude SARS-Cov-2 infection and should not be used as the sole basis for treatment or other patient management decisions. A negative result may occur with  improper specimen collection/handling, submission of specimen other than nasopharyngeal swab, presence of viral mutation(s) within the areas targeted by this assay, and inadequate number of viral copies(<138 copies/mL). A negative result must be combined with clinical observations, patient history, and epidemiological information. The expected result is Negative.  Fact Sheet for Patients:  EntrepreneurPulse.com.au  Fact Sheet for Healthcare Providers:  IncredibleEmployment.be  This test is no t yet approved or cleared by the [place] FDA and  has been authorized for detection and/or diagnosis of SARS-CoV-2 by FDA under an Emergency Use Authorization (EUA). This EUA will remain  in effect (meaning this test can be used) for the duration of the COVID-19 declaration under Section 564(b)(1) of the Act, 21 U.S.C.section 360bbb-3(b)(1), unless the authorization is terminated  or revoked sooner.       Influenza A by PCR NEGATIVE NEGATIVE Final   Influenza B by PCR NEGATIVE NEGATIVE Final    Comment: (NOTE) The Xpert Xpress SARS-CoV-2/FLU/RSV plus assay is intended as an aid in the diagnosis of influenza from Nasopharyngeal swab specimens and should not be used as a sole basis for treatment. Nasal washings and aspirates are unacceptable for Xpert Xpress SARS-CoV-2/FLU/RSV testing.  Fact  Sheet for Patients: EntrepreneurPulse.com.au  Fact Sheet for Healthcare Providers: IncredibleEmployment.be  This test is not yet approved or cleared by the [place] FDA and has been authorized for detection and/or diagnosis of SARS-CoV-2 by FDA under an Emergency Use Authorization (EUA). This EUA will remain in effect (meaning this test can be used) for the duration of the COVID-19 declaration under Section 564(b)(1) of the Act, 21 U.S.C. section 360bbb-3(b)(1), unless the authorization is terminated or revoked.  Performed at [facility], [address], [place], [place] [address]     Time coordinating discharge:   SIGNED:  Irwin Brakeman, MD  [facility] 06/04/2020, 12:14 PM How to contact the [facility] Attending or Consulting provider [place] or covering provider during after hours [place], for this patient?  1. Check the care team in [facility] and look for a) attending/consulting [facility] provider listed and b) the [facility] team listed 2. Log into [email or web address] and use [place]'s universal password to access. If you do not have the password, please contact the hospital operator. 3. Locate the [facility] provider you are looking for under [facility] and page to a number that you can be directly reached. 4. If you still have difficulty reaching the provider, please page the [facility] (Director on Call) for the Hospitalists listed on amion for assistance.

## 2020-06-04 NOTE — H&P (Signed)
History and Physical    [name] HQI:[id number] DOB: 07-May-1960 DOA: 06/03/2020  PCP: Sharilyn Sites, MD  Patient coming from: Home.  I have personally briefly reviewed patient's old medical records in [place]  Chief Complaint: Nausea, vomiting and diarrhea  HPI: Jay Skinner is a 60 y.o. male with medical history significant of osteoarthritis, asthma, permanent atrial fibrillation, history of right heart failure, hypertension, hypothyroidism, PUD with history of GI bleeding secondary to bleeding peptic ulcers and gastritis who is brought to the emergency department due to multiple episodes of emesis and diarrhea earlier in the day associated with hypotension.  All other family members, heart recently being affected by a similar symptomatology.  He also stated that he has been having melena for about a week now.  Denies dysuria, frequency or hematuria.  He denies fever, chills, headache, sore throat, rhinorrhea, wheezing or hemoptysis.  No chest pain, palpitations, diaphoresis, PND, orthopnea or pitting edema of the lower extremities.  No polyuria, polydipsia, polyphagia or blurred vision.  ED Course: Initial vital signs were temperature 98.6 F, pulse 75, respirations 16, BP 150/75 mmHg O2 sat 100% on 2 LPM via nasal cannula.  The patient received pantoprazole 40 mg IVP x1, Pepcid 20 mg IVPB and 1000 mL of normal saline bolus.  Labwork: Fecal occult blood was positive.  PT is 16.8, INR 1.4 PTT 27.  Lipase was normal.  CBC shows a white count of 5.1, hemoglobin 11.4 g/dL with an MCV of 109.5 fL and platelets of 104.  Review of Systems: As per HPI otherwise all other systems reviewed and are negative.  Past Medical History:  Diagnosis Date  . Arthritis   . Asthma   . Heart disease   . History of bleeding ulcers   . Hypertension   . Hypothyroidism     Past Surgical History:  Procedure Laterality Date  . ABDOMINAL SURGERY    . CARDIAC VALVE REPLACEMENT     [facility]   .  COLONOSCOPY N/A 08/04/2015   Procedure: COLONOSCOPY;  Surgeon: Rogene Houston, MD;  [facility];  Service: Endoscopy;  Laterality: N/A;  730  . CORONARY ARTERY BYPASS GRAFT    . LEG SURGERY Left    2008 , [place] History  reports that he has never smoked. He does not have any smokeless tobacco history on file. He reports that he does not drink alcohol and does not use drugs.  Allergies  Allergen Reactions  . Codeine Hives  . Lasix [Furosemide] Hives  . Latex Other (See Comments)  . Penicillins Dermatitis    Family History  Problem Relation Age of Onset  . Colon cancer Other    Prior to Admission medications   Medication Sig Start Date End Date Taking? Authorizing Provider  apixaban (ELIQUIS) 5 MG TABS tablet Take by mouth. 04/16/20   [provider]  aspirin EC 81 MG tablet Take 1 tablet (81 mg total) by mouth daily. 08/05/15   Rehman, Mechele Dawley, MD  azithromycin (ZITHROMAX) 250 MG tablet Take by mouth. 03/26/20   [provider]  benzonatate (TESSALON) 200 MG capsule Take by mouth. 03/26/20   [provider]  bisacodyl (DULCOLAX) 5 MG EC tablet Take by mouth.    [provider]  bumetanide (BUMEX) 2 MG tablet Take by mouth. 04/10/20 04/17/20  [provider]  calcitRIOL (ROCALTROL) 0.25 MCG capsule Take 0.25 mcg by mouth daily.    [provider]  calcium  carbonate (TUMS - DOSED IN MG ELEMENTAL CALCIUM) 500 MG chewable tablet Chew 3 tablets by mouth daily.    [provider]  Cholecalciferol 50 MCG (2000 UT) TABS Take by mouth. 10/03/19   [provider]  clindamycin (CLEOCIN) 300 MG capsule Patient takes before dental work 04/07/15   [provider]  hydrochlorothiazide (MICROZIDE) 12.5 MG capsule Take 12.5 mg by mouth daily.    [provider]  levothyroxine (SYNTHROID, LEVOTHROID) 25 MCG tablet Take 25 mcg by mouth daily before breakfast.    [provider]   lisinopril (PRINIVIL,ZESTRIL) 10 MG tablet Take 10 mg by mouth daily.    [provider]  polyethylene glycol-electrolytes (NULYTELY/GOLYTELY) 420 g solution Take 4,000 mLs by mouth once. 06/21/15   Setzer, Rona Ravens, NP  potassium chloride SA (K-DUR,KLOR-CON) 20 MEQ tablet Take 1 tablet by mouth daily.  05/11/15   [provider]  torsemide (DEMADEX) 20 MG tablet Take 20 mg by mouth every other day.    [provider]    Physical Exam: Vitals:   06/03/20 2245 06/03/20 2300 06/04/20 0000 06/04/20 0100  BP: (!) 158/70 120/66 132/79 124/78  Pulse: 75 72 74 75  Resp: (!) 24 (!) 21 20 (!) 21  Temp: 98.5 F (36.9 C)     TempSrc: Oral     SpO2: 98% 98% 99% 93%  Weight:      Height:        Constitutional: NAD, calm, comfortable Eyes: PERRL, lids and conjunctivae normal ENMT: Mucous membranes are dry. Posterior pharynx clear of any exudate or lesions. Neck: normal, supple, no masses, no thyromegaly Respiratory: Decreased breath sounds in bases, otherwise clear to auscultation bilaterally, no wheezing, no crackles.  No accessory muscle use.  Cardiovascular: Regular rate and rhythm, no murmurs / rubs / gallops. No extremity edema. 2+ pedal pulses. No carotid bruits.  Abdomen: No distention.  Bowel sounds positive.  Soft, mild epigastric tenderness, no guarding, no rebound, no masses palpated. No hepatosplenomegaly. Musculoskeletal: no clubbing / cyanosis.  Good ROM, no contractures. Normal muscle tone.  Skin: no rashes, lesions, ulcers on limited dermatological examination. Neurologic: CN 2-12 grossly intact. Sensation intact, DTR normal. Strength 5/5 in all 4.  Psychiatric: Normal judgment and insight. Alert and oriented x 3. Normal mood.   Labs on Admission: I have personally reviewed following labs and imaging studies  CBC: Recent Labs  Lab 06/03/20 2345  WBC 5.1  NEUTROABS 3.3  HGB 11.4*  HCT 33.5*  MCV 109.5*  PLT 104*    Basic Metabolic Panel: Recent  Labs  Lab 06/03/20 2345  NA 140  K 4.1  CL 94*  CO2 36*  GLUCOSE 122*  BUN 19  CREATININE 1.29*  CALCIUM 7.9*    GFR: Estimated Creatinine Clearance: 51.6 mL/min (A) (by C-G formula based on SCr of 1.29 mg/dL (H)).  Liver Function Tests: Recent Labs  Lab 06/03/20 2345  AST 24  ALT 12  ALKPHOS 59  BILITOT 0.4  PROT 7.6  ALBUMIN 3.8    Urine analysis: No results found for: COLORURINE, APPEARANCEUR, LABSPEC, PHURINE, GLUCOSEU, HGBUR, BILIRUBINUR, KETONESUR, PROTEINUR, UROBILINOGEN, NITRITE, LEUKOCYTESUR  Radiological Exams on Admission: No results found.  EKG: Independently reviewed.  Assessment/Plan Principal Problem:   Acute gastroenteritis  Observation/telemetry. Continue IV fluids. Pantoprazole 40 mg IVP every 12 hours. Antiemetics as needed. Analgesics as needed.  Active Problems:   Melena   Acute blood loss anemia Monitor H&H. Transfuse as needed. Continue PPI. Consult gastroenterology.  Thrombocytopenia ([place]) Holding Xarelto. Monitor platelet count.    HTN (hypertension) Hold ACE inhibitor and diuretics. Monitor BP, renal function electrolytes.    Persistent atrial fibrillation (HCC) CHA?DS?-VASc Score of at least 2. Hold Xarelto due to UGI. Monitor heart rate.    Hypothyroidism Resume levothyroxine once cleared for oral intake.    Type 2 diabetes mellitus (HCC) Currently NPO. CBG monitoring.    DVT prophylaxis: Eliquis held.  On SCDs.. Code Status:   Full code. Family Communication:   Disposition Plan:   Patient is from:  Home.  Anticipated DC to:  TBD.  Anticipated DC date:  06/05/2020.  Anticipated DC barriers: Clinical status. Consults called:  Routine gastroenterology consult. Admission status:  Observation/telemetry.   Severity of Illness: High severity after developing melena secondary to UGI bleed in the setting of acute gastroenteritis.  The patient will need to remain in the hospital for 24 to 48 hours bowel rest,  IV hydration, H&H monitoring and GI evaluation.  Reubin Milan MD [facility]  How to contact the [facility] Attending or Consulting provider [place] or covering provider during after hours [place], for this patient?   1. Check the care team in [facility] and look for a) attending/consulting [facility] provider listed and b) the [facility] team listed 2. Log into [email or web address] and use [place]'s universal password to access. If you do not have the password, please contact the hospital operator. 3. Locate the [facility] provider you are looking for under [facility] and page to a number that you can be directly reached. 4. If you still have difficulty reaching the provider, please page the [facility] (Director on Call) for the Hospitalists listed on amion for assistance.  06/04/2020, 1:42 AM   This document was prepared using Dragon voice recognition software and may contain some unintended transcription errors.

## 2020-06-08 DIAGNOSIS — I4819 Other persistent atrial fibrillation: Secondary | ICD-10-CM | POA: Diagnosis not present

## 2020-06-09 DIAGNOSIS — I50810 Right heart failure, unspecified: Secondary | ICD-10-CM | POA: Diagnosis not present

## 2020-06-09 DIAGNOSIS — K259 Gastric ulcer, unspecified as acute or chronic, without hemorrhage or perforation: Secondary | ICD-10-CM | POA: Diagnosis not present

## 2020-06-09 DIAGNOSIS — I5032 Chronic diastolic (congestive) heart failure: Secondary | ICD-10-CM | POA: Diagnosis not present

## 2020-06-09 DIAGNOSIS — I484 Atypical atrial flutter: Secondary | ICD-10-CM | POA: Diagnosis not present

## 2020-06-09 DIAGNOSIS — I110 Hypertensive heart disease with heart failure: Secondary | ICD-10-CM | POA: Diagnosis not present

## 2020-06-09 DIAGNOSIS — K921 Melena: Secondary | ICD-10-CM | POA: Diagnosis not present

## 2020-06-10 DIAGNOSIS — I4819 Other persistent atrial fibrillation: Secondary | ICD-10-CM | POA: Diagnosis not present

## 2020-06-10 DIAGNOSIS — I484 Atypical atrial flutter: Secondary | ICD-10-CM | POA: Diagnosis not present

## 2020-06-10 DIAGNOSIS — J90 Pleural effusion, not elsewhere classified: Secondary | ICD-10-CM | POA: Diagnosis not present

## 2020-06-10 DIAGNOSIS — K254 Chronic or unspecified gastric ulcer with hemorrhage: Secondary | ICD-10-CM | POA: Diagnosis not present

## 2020-06-10 DIAGNOSIS — R1312 Dysphagia, oropharyngeal phase: Secondary | ICD-10-CM | POA: Diagnosis not present

## 2020-06-10 DIAGNOSIS — M7989 Other specified soft tissue disorders: Secondary | ICD-10-CM | POA: Diagnosis not present

## 2020-06-10 DIAGNOSIS — J811 Chronic pulmonary edema: Secondary | ICD-10-CM | POA: Diagnosis not present

## 2020-06-10 DIAGNOSIS — N179 Acute kidney failure, unspecified: Secondary | ICD-10-CM | POA: Diagnosis not present

## 2020-06-10 DIAGNOSIS — K5730 Diverticulosis of large intestine without perforation or abscess without bleeding: Secondary | ICD-10-CM | POA: Diagnosis not present

## 2020-06-10 DIAGNOSIS — R1084 Generalized abdominal pain: Secondary | ICD-10-CM | POA: Diagnosis not present

## 2020-06-10 DIAGNOSIS — K274 Chronic or unspecified peptic ulcer, site unspecified, with hemorrhage: Secondary | ICD-10-CM | POA: Diagnosis not present

## 2020-06-10 DIAGNOSIS — J9811 Atelectasis: Secondary | ICD-10-CM | POA: Diagnosis not present

## 2020-06-10 DIAGNOSIS — K7400 Hepatic fibrosis, unspecified: Secondary | ICD-10-CM | POA: Diagnosis not present

## 2020-06-10 DIAGNOSIS — J9622 Acute and chronic respiratory failure with hypercapnia: Secondary | ICD-10-CM | POA: Diagnosis not present

## 2020-06-10 DIAGNOSIS — R519 Headache, unspecified: Secondary | ICD-10-CM | POA: Diagnosis not present

## 2020-06-10 DIAGNOSIS — R109 Unspecified abdominal pain: Secondary | ICD-10-CM | POA: Diagnosis not present

## 2020-06-10 DIAGNOSIS — Z8719 Personal history of other diseases of the digestive system: Secondary | ICD-10-CM | POA: Diagnosis not present

## 2020-06-10 DIAGNOSIS — J9621 Acute and chronic respiratory failure with hypoxia: Secondary | ICD-10-CM | POA: Diagnosis not present

## 2020-06-11 DIAGNOSIS — E039 Hypothyroidism, unspecified: Secondary | ICD-10-CM | POA: Diagnosis present

## 2020-06-11 DIAGNOSIS — M1992 Post-traumatic osteoarthritis, unspecified site: Secondary | ICD-10-CM | POA: Diagnosis present

## 2020-06-11 DIAGNOSIS — R339 Retention of urine, unspecified: Secondary | ICD-10-CM | POA: Diagnosis present

## 2020-06-11 DIAGNOSIS — E208 Other hypoparathyroidism: Secondary | ICD-10-CM | POA: Diagnosis present

## 2020-06-11 DIAGNOSIS — M25462 Effusion, left knee: Secondary | ICD-10-CM | POA: Diagnosis present

## 2020-06-11 DIAGNOSIS — D539 Nutritional anemia, unspecified: Secondary | ICD-10-CM | POA: Diagnosis present

## 2020-06-11 DIAGNOSIS — J811 Chronic pulmonary edema: Secondary | ICD-10-CM | POA: Diagnosis not present

## 2020-06-11 DIAGNOSIS — J9621 Acute and chronic respiratory failure with hypoxia: Secondary | ICD-10-CM | POA: Diagnosis present

## 2020-06-11 DIAGNOSIS — K274 Chronic or unspecified peptic ulcer, site unspecified, with hemorrhage: Secondary | ICD-10-CM | POA: Diagnosis present

## 2020-06-11 DIAGNOSIS — I4819 Other persistent atrial fibrillation: Secondary | ICD-10-CM | POA: Diagnosis present

## 2020-06-11 DIAGNOSIS — I371 Nonrheumatic pulmonary valve insufficiency: Secondary | ICD-10-CM | POA: Diagnosis present

## 2020-06-11 DIAGNOSIS — N179 Acute kidney failure, unspecified: Secondary | ICD-10-CM | POA: Diagnosis present

## 2020-06-11 DIAGNOSIS — E119 Type 2 diabetes mellitus without complications: Secondary | ICD-10-CM | POA: Diagnosis present

## 2020-06-11 DIAGNOSIS — J9811 Atelectasis: Secondary | ICD-10-CM | POA: Diagnosis present

## 2020-06-11 DIAGNOSIS — R918 Other nonspecific abnormal finding of lung field: Secondary | ICD-10-CM | POA: Diagnosis not present

## 2020-06-11 DIAGNOSIS — R1312 Dysphagia, oropharyngeal phase: Secondary | ICD-10-CM | POA: Diagnosis present

## 2020-06-11 DIAGNOSIS — I5082 Biventricular heart failure: Secondary | ICD-10-CM | POA: Diagnosis present

## 2020-06-11 DIAGNOSIS — R519 Headache, unspecified: Secondary | ICD-10-CM | POA: Diagnosis not present

## 2020-06-11 DIAGNOSIS — I50813 Acute on chronic right heart failure: Secondary | ICD-10-CM | POA: Diagnosis not present

## 2020-06-11 DIAGNOSIS — M7989 Other specified soft tissue disorders: Secondary | ICD-10-CM | POA: Diagnosis not present

## 2020-06-11 DIAGNOSIS — K254 Chronic or unspecified gastric ulcer with hemorrhage: Secondary | ICD-10-CM | POA: Diagnosis not present

## 2020-06-11 DIAGNOSIS — D696 Thrombocytopenia, unspecified: Secondary | ICD-10-CM | POA: Diagnosis present

## 2020-06-11 DIAGNOSIS — J984 Other disorders of lung: Secondary | ICD-10-CM | POA: Diagnosis present

## 2020-06-11 DIAGNOSIS — R188 Other ascites: Secondary | ICD-10-CM | POA: Diagnosis present

## 2020-06-11 DIAGNOSIS — R109 Unspecified abdominal pain: Secondary | ICD-10-CM | POA: Diagnosis not present

## 2020-06-11 DIAGNOSIS — J90 Pleural effusion, not elsewhere classified: Secondary | ICD-10-CM | POA: Diagnosis not present

## 2020-06-11 DIAGNOSIS — J9622 Acute and chronic respiratory failure with hypercapnia: Secondary | ICD-10-CM | POA: Diagnosis present

## 2020-06-11 DIAGNOSIS — J45909 Unspecified asthma, uncomplicated: Secondary | ICD-10-CM | POA: Diagnosis present

## 2020-06-11 DIAGNOSIS — I484 Atypical atrial flutter: Secondary | ICD-10-CM | POA: Diagnosis present

## 2020-06-11 DIAGNOSIS — I110 Hypertensive heart disease with heart failure: Secondary | ICD-10-CM | POA: Diagnosis present

## 2020-06-11 DIAGNOSIS — Z20822 Contact with and (suspected) exposure to covid-19: Secondary | ICD-10-CM | POA: Diagnosis present

## 2020-06-11 DIAGNOSIS — I5032 Chronic diastolic (congestive) heart failure: Secondary | ICD-10-CM | POA: Diagnosis present

## 2020-06-11 DIAGNOSIS — R1013 Epigastric pain: Secondary | ICD-10-CM | POA: Diagnosis not present

## 2020-06-11 DIAGNOSIS — E876 Hypokalemia: Secondary | ICD-10-CM | POA: Diagnosis present

## 2020-06-16 DIAGNOSIS — K273 Acute peptic ulcer, site unspecified, without hemorrhage or perforation: Secondary | ICD-10-CM | POA: Diagnosis not present

## 2020-06-16 DIAGNOSIS — I129 Hypertensive chronic kidney disease with stage 1 through stage 4 chronic kidney disease, or unspecified chronic kidney disease: Secondary | ICD-10-CM | POA: Diagnosis not present

## 2020-06-16 DIAGNOSIS — D649 Anemia, unspecified: Secondary | ICD-10-CM | POA: Diagnosis not present

## 2020-06-16 DIAGNOSIS — E441 Mild protein-calorie malnutrition: Secondary | ICD-10-CM | POA: Diagnosis not present

## 2020-06-16 DIAGNOSIS — E039 Hypothyroidism, unspecified: Secondary | ICD-10-CM | POA: Diagnosis not present

## 2020-06-16 DIAGNOSIS — N182 Chronic kidney disease, stage 2 (mild): Secondary | ICD-10-CM | POA: Diagnosis not present

## 2020-06-16 DIAGNOSIS — Z6828 Body mass index (BMI) 28.0-28.9, adult: Secondary | ICD-10-CM | POA: Diagnosis not present

## 2020-06-16 DIAGNOSIS — K922 Gastrointestinal hemorrhage, unspecified: Secondary | ICD-10-CM | POA: Diagnosis not present

## 2020-06-16 DIAGNOSIS — E7849 Other hyperlipidemia: Secondary | ICD-10-CM | POA: Diagnosis not present

## 2020-06-21 ENCOUNTER — Emergency Department (HOSPITAL_COMMUNITY): Payer: Medicare Other

## 2020-06-21 ENCOUNTER — Inpatient Hospital Stay (HOSPITAL_COMMUNITY)
Admission: EM | Admit: 2020-06-21 | Discharge: 2020-07-18 | DRG: 870 | Disposition: E | Payer: Medicare Other | Attending: Family Medicine | Admitting: Family Medicine

## 2020-06-21 ENCOUNTER — Other Ambulatory Visit: Payer: Self-pay

## 2020-06-21 ENCOUNTER — Encounter (HOSPITAL_COMMUNITY): Payer: Self-pay

## 2020-06-21 DIAGNOSIS — Z8774 Personal history of (corrected) congenital malformations of heart and circulatory system: Secondary | ICD-10-CM

## 2020-06-21 DIAGNOSIS — Z800 Family history of malignant neoplasm of digestive organs: Secondary | ICD-10-CM

## 2020-06-21 DIAGNOSIS — R579 Shock, unspecified: Secondary | ICD-10-CM | POA: Diagnosis not present

## 2020-06-21 DIAGNOSIS — J9601 Acute respiratory failure with hypoxia: Secondary | ICD-10-CM | POA: Diagnosis not present

## 2020-06-21 DIAGNOSIS — E875 Hyperkalemia: Secondary | ICD-10-CM | POA: Diagnosis not present

## 2020-06-21 DIAGNOSIS — E039 Hypothyroidism, unspecified: Secondary | ICD-10-CM | POA: Diagnosis present

## 2020-06-21 DIAGNOSIS — I5032 Chronic diastolic (congestive) heart failure: Secondary | ICD-10-CM | POA: Diagnosis present

## 2020-06-21 DIAGNOSIS — J45909 Unspecified asthma, uncomplicated: Secondary | ICD-10-CM | POA: Diagnosis present

## 2020-06-21 DIAGNOSIS — Z951 Presence of aortocoronary bypass graft: Secondary | ICD-10-CM | POA: Diagnosis not present

## 2020-06-21 DIAGNOSIS — Z79890 Hormone replacement therapy (postmenopausal): Secondary | ICD-10-CM

## 2020-06-21 DIAGNOSIS — J151 Pneumonia due to Pseudomonas: Secondary | ICD-10-CM | POA: Diagnosis present

## 2020-06-21 DIAGNOSIS — R140 Abdominal distension (gaseous): Secondary | ICD-10-CM | POA: Diagnosis not present

## 2020-06-21 DIAGNOSIS — I4819 Other persistent atrial fibrillation: Secondary | ICD-10-CM | POA: Diagnosis present

## 2020-06-21 DIAGNOSIS — J90 Pleural effusion, not elsewhere classified: Secondary | ICD-10-CM

## 2020-06-21 DIAGNOSIS — Z7901 Long term (current) use of anticoagulants: Secondary | ICD-10-CM

## 2020-06-21 DIAGNOSIS — A419 Sepsis, unspecified organism: Secondary | ICD-10-CM | POA: Diagnosis not present

## 2020-06-21 DIAGNOSIS — R21 Rash and other nonspecific skin eruption: Secondary | ICD-10-CM | POA: Diagnosis not present

## 2020-06-21 DIAGNOSIS — J9622 Acute and chronic respiratory failure with hypercapnia: Secondary | ICD-10-CM | POA: Diagnosis not present

## 2020-06-21 DIAGNOSIS — N179 Acute kidney failure, unspecified: Secondary | ICD-10-CM | POA: Diagnosis not present

## 2020-06-21 DIAGNOSIS — K567 Ileus, unspecified: Secondary | ICD-10-CM | POA: Diagnosis not present

## 2020-06-21 DIAGNOSIS — K254 Chronic or unspecified gastric ulcer with hemorrhage: Secondary | ICD-10-CM | POA: Diagnosis present

## 2020-06-21 DIAGNOSIS — R0600 Dyspnea, unspecified: Secondary | ICD-10-CM

## 2020-06-21 DIAGNOSIS — Z79899 Other long term (current) drug therapy: Secondary | ICD-10-CM | POA: Diagnosis not present

## 2020-06-21 DIAGNOSIS — J9621 Acute and chronic respiratory failure with hypoxia: Secondary | ICD-10-CM | POA: Diagnosis present

## 2020-06-21 DIAGNOSIS — J9811 Atelectasis: Secondary | ICD-10-CM | POA: Diagnosis not present

## 2020-06-21 DIAGNOSIS — I50812 Chronic right heart failure: Secondary | ICD-10-CM | POA: Diagnosis not present

## 2020-06-21 DIAGNOSIS — Z20822 Contact with and (suspected) exposure to covid-19: Secondary | ICD-10-CM | POA: Diagnosis present

## 2020-06-21 DIAGNOSIS — J9602 Acute respiratory failure with hypercapnia: Secondary | ICD-10-CM | POA: Diagnosis not present

## 2020-06-21 DIAGNOSIS — J9690 Respiratory failure, unspecified, unspecified whether with hypoxia or hypercapnia: Secondary | ICD-10-CM | POA: Diagnosis not present

## 2020-06-21 DIAGNOSIS — J9620 Acute and chronic respiratory failure, unspecified whether with hypoxia or hypercapnia: Secondary | ICD-10-CM | POA: Insufficient documentation

## 2020-06-21 DIAGNOSIS — I110 Hypertensive heart disease with heart failure: Secondary | ICD-10-CM | POA: Diagnosis present

## 2020-06-21 DIAGNOSIS — R4182 Altered mental status, unspecified: Secondary | ICD-10-CM | POA: Diagnosis not present

## 2020-06-21 DIAGNOSIS — K439 Ventral hernia without obstruction or gangrene: Secondary | ICD-10-CM | POA: Diagnosis present

## 2020-06-21 DIAGNOSIS — Z66 Do not resuscitate: Secondary | ICD-10-CM | POA: Diagnosis not present

## 2020-06-21 DIAGNOSIS — I10 Essential (primary) hypertension: Secondary | ICD-10-CM | POA: Diagnosis not present

## 2020-06-21 DIAGNOSIS — R6521 Severe sepsis with septic shock: Secondary | ICD-10-CM | POA: Diagnosis present

## 2020-06-21 DIAGNOSIS — I4510 Unspecified right bundle-branch block: Secondary | ICD-10-CM | POA: Diagnosis present

## 2020-06-21 DIAGNOSIS — Y92239 Unspecified place in hospital as the place of occurrence of the external cause: Secondary | ICD-10-CM | POA: Diagnosis not present

## 2020-06-21 DIAGNOSIS — D696 Thrombocytopenia, unspecified: Secondary | ICD-10-CM | POA: Diagnosis not present

## 2020-06-21 DIAGNOSIS — Z952 Presence of prosthetic heart valve: Secondary | ICD-10-CM

## 2020-06-21 DIAGNOSIS — J811 Chronic pulmonary edema: Secondary | ICD-10-CM | POA: Diagnosis not present

## 2020-06-21 DIAGNOSIS — R918 Other nonspecific abnormal finding of lung field: Secondary | ICD-10-CM | POA: Diagnosis not present

## 2020-06-21 DIAGNOSIS — R0602 Shortness of breath: Secondary | ICD-10-CM | POA: Diagnosis not present

## 2020-06-21 DIAGNOSIS — Z9981 Dependence on supplemental oxygen: Secondary | ICD-10-CM | POA: Diagnosis not present

## 2020-06-21 DIAGNOSIS — T502X5A Adverse effect of carbonic-anhydrase inhibitors, benzothiadiazides and other diuretics, initial encounter: Secondary | ICD-10-CM | POA: Diagnosis not present

## 2020-06-21 DIAGNOSIS — Z885 Allergy status to narcotic agent status: Secondary | ICD-10-CM

## 2020-06-21 DIAGNOSIS — Z515 Encounter for palliative care: Secondary | ICD-10-CM | POA: Diagnosis not present

## 2020-06-21 DIAGNOSIS — J986 Disorders of diaphragm: Secondary | ICD-10-CM | POA: Diagnosis present

## 2020-06-21 DIAGNOSIS — M1990 Unspecified osteoarthritis, unspecified site: Secondary | ICD-10-CM | POA: Diagnosis present

## 2020-06-21 DIAGNOSIS — E1165 Type 2 diabetes mellitus with hyperglycemia: Secondary | ICD-10-CM | POA: Diagnosis not present

## 2020-06-21 DIAGNOSIS — Z95828 Presence of other vascular implants and grafts: Secondary | ICD-10-CM

## 2020-06-21 DIAGNOSIS — Z880 Allergy status to penicillin: Secondary | ICD-10-CM

## 2020-06-21 DIAGNOSIS — E874 Mixed disorder of acid-base balance: Secondary | ICD-10-CM | POA: Diagnosis not present

## 2020-06-21 DIAGNOSIS — R0689 Other abnormalities of breathing: Secondary | ICD-10-CM

## 2020-06-21 DIAGNOSIS — R531 Weakness: Secondary | ICD-10-CM | POA: Diagnosis not present

## 2020-06-21 DIAGNOSIS — Q213 Tetralogy of Fallot: Secondary | ICD-10-CM | POA: Diagnosis not present

## 2020-06-21 DIAGNOSIS — R0902 Hypoxemia: Secondary | ICD-10-CM | POA: Diagnosis not present

## 2020-06-21 DIAGNOSIS — Z7189 Other specified counseling: Secondary | ICD-10-CM | POA: Diagnosis not present

## 2020-06-21 DIAGNOSIS — D649 Anemia, unspecified: Secondary | ICD-10-CM | POA: Diagnosis present

## 2020-06-21 DIAGNOSIS — I517 Cardiomegaly: Secondary | ICD-10-CM | POA: Diagnosis not present

## 2020-06-21 DIAGNOSIS — G9341 Metabolic encephalopathy: Secondary | ICD-10-CM | POA: Diagnosis present

## 2020-06-21 DIAGNOSIS — E876 Hypokalemia: Secondary | ICD-10-CM | POA: Diagnosis not present

## 2020-06-21 DIAGNOSIS — J9600 Acute respiratory failure, unspecified whether with hypoxia or hypercapnia: Secondary | ICD-10-CM

## 2020-06-21 DIAGNOSIS — E119 Type 2 diabetes mellitus without complications: Secondary | ICD-10-CM

## 2020-06-21 DIAGNOSIS — I4891 Unspecified atrial fibrillation: Secondary | ICD-10-CM | POA: Diagnosis not present

## 2020-06-21 DIAGNOSIS — R509 Fever, unspecified: Secondary | ICD-10-CM | POA: Diagnosis not present

## 2020-06-21 DIAGNOSIS — I159 Secondary hypertension, unspecified: Secondary | ICD-10-CM | POA: Diagnosis not present

## 2020-06-21 DIAGNOSIS — Z8711 Personal history of peptic ulcer disease: Secondary | ICD-10-CM

## 2020-06-21 DIAGNOSIS — Z91040 Latex allergy status: Secondary | ICD-10-CM

## 2020-06-21 DIAGNOSIS — Z978 Presence of other specified devices: Secondary | ICD-10-CM

## 2020-06-21 LAB — RESP PANEL BY RT-PCR (FLU A&B, COVID) ARPGX2
Influenza A by PCR: NEGATIVE
Influenza B by PCR: NEGATIVE
SARS Coronavirus 2 by RT PCR: NEGATIVE

## 2020-06-21 LAB — COMPREHENSIVE METABOLIC PANEL
ALT: 14 U/L (ref 0–44)
AST: 22 U/L (ref 15–41)
Albumin: 4.3 g/dL (ref 3.5–5.0)
Alkaline Phosphatase: 69 U/L (ref 38–126)
Anion gap: 14 (ref 5–15)
BUN: 22 mg/dL — ABNORMAL HIGH (ref 6–20)
CO2: 47 mmol/L — ABNORMAL HIGH (ref 22–32)
Calcium: 9.3 mg/dL (ref 8.9–10.3)
Chloride: 77 mmol/L — ABNORMAL LOW (ref 98–111)
Creatinine, Ser: 1.17 mg/dL (ref 0.61–1.24)
GFR, Estimated: >60 mL/min (ref >60)
Glucose, Bld: 203 mg/dL — ABNORMAL HIGH (ref 70–99)
Potassium: 4.6 mmol/L (ref 3.5–5.1)
Sodium: 138 mmol/L (ref 135–145)
Total Bilirubin: 1 mg/dL (ref 0.3–1.2)
Total Protein: 8.6 g/dL — ABNORMAL HIGH (ref 6.5–8.1)

## 2020-06-21 LAB — BLOOD GAS, ARTERIAL
FIO2: 100
O2 Saturation: 41.8 %
Patient temperature: 37.1
pCO2 arterial: >120 mmHg (ref 32.0–48.0)
pH, Arterial: 7.159 — CL (ref 7.350–7.450)
pO2, Arterial: <31 mmHg — CL (ref 83.0–108.0)

## 2020-06-21 LAB — BLOOD GAS, VENOUS
FIO2: 80
O2 Saturation: 97.5 %
Patient temperature: 37.1
pCO2, Ven: >120 mmHg (ref 44.0–60.0)
pH, Ven: 7.14 — CL (ref 7.250–7.430)
pO2, Ven: 123 mmHg — ABNORMAL HIGH (ref 32.0–45.0)

## 2020-06-21 LAB — URINALYSIS, ROUTINE W REFLEX MICROSCOPIC
Bilirubin Urine: NEGATIVE
Glucose, UA: NEGATIVE mg/dL
Ketones, ur: NEGATIVE mg/dL
Leukocytes,Ua: NEGATIVE
Nitrite: NEGATIVE
Protein, ur: NEGATIVE mg/dL
Specific Gravity, Urine: 1.012 (ref 1.005–1.030)
pH: 5 (ref 5.0–8.0)

## 2020-06-21 LAB — CBC WITH DIFFERENTIAL/PLATELET
Abs Immature Granulocytes: 0.05 10*3/uL (ref 0.00–0.07)
Basophils Absolute: 0 10*3/uL (ref 0.0–0.1)
Basophils Relative: 1 %
Eosinophils Absolute: 0 10*3/uL (ref 0.0–0.5)
Eosinophils Relative: 0 %
HCT: 42.8 % (ref 39.0–52.0)
Hemoglobin: 14.3 g/dL (ref 13.0–17.0)
Immature Granulocytes: 1 %
Lymphocytes Relative: 10 %
Lymphs Abs: 0.5 10*3/uL — ABNORMAL LOW (ref 0.7–4.0)
MCH: 37.5 pg — ABNORMAL HIGH (ref 26.0–34.0)
MCHC: 33.4 g/dL (ref 30.0–36.0)
MCV: 112.3 fL — ABNORMAL HIGH (ref 80.0–100.0)
Monocytes Absolute: 0.5 10*3/uL (ref 0.1–1.0)
Monocytes Relative: 10 %
Neutro Abs: 4.1 10*3/uL (ref 1.7–7.7)
Neutrophils Relative %: 78 %
Platelets: 152 10*3/uL (ref 150–400)
RBC: 3.81 MIL/uL — ABNORMAL LOW (ref 4.22–5.81)
RDW: 13.9 % (ref 11.5–15.5)
WBC: 5.3 10*3/uL (ref 4.0–10.5)
nRBC: 0 % (ref 0.0–0.2)

## 2020-06-21 LAB — BRAIN NATRIURETIC PEPTIDE: B Natriuretic Peptide: 368 pg/mL — ABNORMAL HIGH (ref 0.0–100.0)

## 2020-06-21 LAB — LACTIC ACID, PLASMA
Lactic Acid, Venous: 1.1 mmol/L (ref 0.5–1.9)
Lactic Acid, Venous: 1.8 mmol/L (ref 0.5–1.9)

## 2020-06-21 LAB — TROPONIN I (HIGH SENSITIVITY)
Troponin I (High Sensitivity): 13 ng/L (ref <18)
Troponin I (High Sensitivity): 12 ng/L (ref <18)

## 2020-06-21 MED ORDER — FENTANYL CITRATE (PF) 100 MCG/2ML IJ SOLN
50.0000 ug | Freq: Once | INTRAMUSCULAR | Status: AC
  Administered 2020-06-21: 50 ug via INTRAVENOUS

## 2020-06-21 MED ORDER — FENTANYL BOLUS VIA INFUSION
25.0000 ug | INTRAVENOUS | Status: DC | PRN
  Filled 2020-06-21: qty 25

## 2020-06-21 MED ORDER — BUMETANIDE 0.25 MG/ML IJ SOLN
1.0000 mg | Freq: Two times a day (BID) | INTRAMUSCULAR | Status: DC
  Administered 2020-06-22: 1 mg via INTRAVENOUS
  Filled 2020-06-21 (×7): qty 4

## 2020-06-21 MED ORDER — ETOMIDATE 2 MG/ML IV SOLN
INTRAVENOUS | Status: AC | PRN
  Administered 2020-06-21: 20 mg via INTRAVENOUS

## 2020-06-21 MED ORDER — PROPOFOL 1000 MG/100ML IV EMUL
INTRAVENOUS | Status: AC
  Filled 2020-06-21: qty 100

## 2020-06-21 MED ORDER — PROPOFOL 1000 MG/100ML IV EMUL
0.0000 ug/kg/min | INTRAVENOUS | Status: DC
  Administered 2020-06-21: 5 ug/kg/min via INTRAVENOUS
  Administered 2020-06-22 (×2): 30 ug/kg/min via INTRAVENOUS
  Administered 2020-06-22 – 2020-06-23 (×2): 35 ug/kg/min via INTRAVENOUS
  Administered 2020-06-23: 30 ug/kg/min via INTRAVENOUS
  Administered 2020-06-23 – 2020-06-24 (×2): 25 ug/kg/min via INTRAVENOUS
  Administered 2020-06-24: 30 ug/kg/min via INTRAVENOUS
  Administered 2020-06-24: 35 ug/kg/min via INTRAVENOUS
  Administered 2020-06-25: 30 ug/kg/min via INTRAVENOUS
  Administered 2020-06-25: 15 ug/kg/min via INTRAVENOUS
  Administered 2020-06-26: 25 ug/kg/min via INTRAVENOUS
  Administered 2020-06-26: 15 ug/kg/min via INTRAVENOUS
  Administered 2020-06-27 – 2020-06-28 (×4): 25 ug/kg/min via INTRAVENOUS
  Filled 2020-06-21: qty 100
  Filled 2020-06-21: qty 200
  Filled 2020-06-21 (×3): qty 100
  Filled 2020-06-21: qty 200
  Filled 2020-06-21 (×10): qty 100

## 2020-06-21 MED ORDER — ALBUTEROL SULFATE HFA 108 (90 BASE) MCG/ACT IN AERS: 2.0000 | INHALATION_SPRAY | RESPIRATORY_TRACT | Status: DC | PRN

## 2020-06-21 MED ORDER — SUCCINYLCHOLINE CHLORIDE 20 MG/ML IJ SOLN
INTRAMUSCULAR | Status: AC | PRN
  Administered 2020-06-21: 200 mg via INTRAVENOUS

## 2020-06-21 MED ORDER — FENTANYL 2500MCG IN NS 250ML (10MCG/ML) PREMIX INFUSION
25.0000 ug/h | INTRAVENOUS | Status: DC
  Administered 2020-06-21: 50 ug/h via INTRAVENOUS
  Filled 2020-06-21: qty 250

## 2020-06-21 NOTE — Progress Notes (Signed)
ETT pulled back by 3cm per MD order after CXR.  Patient is now 20 at lip.

## 2020-06-21 NOTE — ED Provider Notes (Signed)
[facility] EMERGENCY DEPARTMENT Provider Note   CSN: [id number] Arrival date & time: 07/03/2020  1826     History Chief Complaint  Patient presents with  . Shortness of Breath    Jay Skinner is a 60 y.o. male.  HPI    Jay Skinner is a 60 y.o. 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  Patient was recently admitted to [facility] for it GI bleed, since being discharged home he has steadily declined.  Today family found him sitting in his chair sleeping and drooling.  He is normally alert and conversant but now appears altered.  They state he has had a cough for the last several days.  Patient denies any chest pain.  They deny any vomiting or diarrhea.  They state he has been vaccinated against Covid   Past Medical History:  Diagnosis Date  . Arthritis   . Asthma   . Heart disease   . History of bleeding ulcers   . Hypertension   . Hypothyroidism     Patient Active Problem List   Diagnosis Date Noted  . Acute gastroenteritis 06/04/2020  . Melena 06/04/2020  . Acute blood loss anemia 06/04/2020  . Thrombocytopenia ([place]) 06/04/2020  . Hypothyroidism   . Type 2 diabetes mellitus ([place])   . Gastrointestinal hemorrhage   . Nausea vomiting and diarrhea   . AKI (acute kidney injury) ([place]) 04/06/2020  . Atypical atrial flutter ([place]) 04/06/2020  . Gastrointestinal hemorrhage associated with gastric ulcer 04/06/2020  . Leukocytosis 04/06/2020  . Other hypoparathyroidism ([place])  06/25/2019  . Moderate pulmonary valve regurgitation 05/29/2019  . 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 Diuretic-induced hypokalemia 05/17/2019  . Intensive care unit patient on diuretic therapy 05/17/2019  . Urinary retention 05/17/2019  . Acute respiratory failure with hypoxia and hypercapnia ([place]) 05/16/2019  . Hypercarbia 05/16/2019  . Respiratory acidosis 05/16/2019  . Acute on chronic right heart failure ([place]) 05/13/2019  . Edema of left lower extremity 01/07/2019  . Effusion of left knee 04/02/2018  . Persistent atrial fibrillation ([place]) 11/06/2017  . Post-traumatic osteoarthritis of one knee 12/23/2013  . Stiffness of knee joint 05/30/2012  . Difficulty in walking(719.7) 05/30/2012  . Left leg weakness 05/30/2012  . H/O ventricular tachycardia 08/08/2011  . History of tricuspid valve annuloplasty 08/08/2011  . HTN (hypertension) 08/08/2011  . Left knee pain 08/08/2011  . Pulmonary insufficiency 08/08/2011  . 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 ABDOMINAL SURGERY    . CARDIAC VALVE REPLACEMENT     [facility]   . COLONOSCOPY N/A 08/04/2015   Procedure: COLONOSCOPY;  Surgeon: Rogene Houston, MD;  Location: [facility];  Service: Endoscopy;  Laterality: N/A;  730  . CORONARY ARTERY BYPASS GRAFT    . LEG SURGERY Left    2008 , [facility]       Family History  Problem Relation Age of Onset  . Colon cancer Other     Social History   Tobacco Use  . Smoking status: Never  Smoker  . Smokeless tobacco: Never Used  Substance Use Topics  . Alcohol use: No  . Drug use: No    Home Medications Prior to Admission medications   Medication Sig Start Date End Date Taking? Authorizing Provider  acetaminophen (TYLENOL) 325 MG tablet Take 650 mg by mouth every 4 (four) hours as needed for pain.    [provider]  amiodarone (PACERONE) 200 MG tablet Take 400 mg by mouth daily. 05/19/20 05/19/21   [provider]  bisacodyl (DULCOLAX) 5 MG EC tablet Take 5 mg by mouth daily as needed for mild constipation.    [provider]  bumetanide (BUMEX) 2 MG tablet Take 2 mg by mouth daily. 04/10/20 06/04/20  [provider]  calcitRIOL (ROCALTROL) 0.25 MCG capsule Take 0.25 mcg by mouth daily.    [provider]  calcium carbonate (TUMS - DOSED IN MG ELEMENTAL CALCIUM) 500 MG chewable tablet Chew 3 tablets by mouth daily.    [provider]  clindamycin (CLEOCIN) 300 MG capsule Take 300 mg by mouth as needed (Prior to dental work). 04/07/15   [provider]  D3 HIGH POTENCY 50 MCG (2000 UT) CAPS Take 2,000 Units by mouth daily. 04/13/20   [provider]  diclofenac Sodium (VOLTAREN) 1 % GEL Apply 1 application topically 2 (two) times daily as needed (knee pain).    [provider]  Docusate Sodium (DSS) 100 MG CAPS Take 200 mg by mouth at bedtime.    [provider]  ferrous sulfate 325 (65 FE) MG EC tablet Take 325 mg by mouth daily.    [provider]  levothyroxine (SYNTHROID) 50 MCG tablet Take 50 mcg by mouth daily. 04/12/20   [provider]  meclizine (ANTIVERT) 12.5 MG tablet Take 12.5 mg by mouth 3 (three) times daily as needed for dizziness. 05/19/20   [provider]  pantoprazole (PROTONIX) 40 MG tablet Take 40 mg by mouth 2 (two) times daily. 04/10/20   [provider]  potassium chloride SA (K-DUR,KLOR-CON) 20 MEQ tablet Take 1 tablet by mouth daily.  05/11/15   [provider]  spironolactone (ALDACTONE) 25 MG tablet Take 25 mg by mouth daily. 04/12/20   [provider]    Allergies    Codeine, Lasix [furosemide], Latex, and Penicillins  Review of Systems   Review of Systems  Constitutional: Negative for fever.  HENT: Negative for ear pain and sore throat.   Eyes: Negative for pain and visual disturbance.  Respiratory: Positive for cough and shortness  of breath.   Cardiovascular: Negative for chest pain.  Gastrointestinal: Positive for abdominal pain (chronic, from hernia). Negative for diarrhea and vomiting.  Musculoskeletal: Negative for back pain.  Skin: Negative for rash.  Neurological: Positive for weakness and headaches.       Ams  All other systems reviewed and are negative.   Physical Exam Updated Vital Signs BP (!) 154/69   Pulse 67   Temp 98.8 F (37.1 C) (Rectal)   Resp (!) 27   Ht 5\' 4"  (1.626 m)   Wt 68 kg   SpO2 94%   BMI 25.75 kg/m   Physical Exam Vitals and nursing note reviewed.  Constitutional:      Appearance: He is ill-appearing and diaphoretic.  HENT:     Head: Normocephalic and atraumatic.  Eyes:     Conjunctiva/sclera: Conjunctivae normal.  Cardiovascular:     Rate and Rhythm: Normal rate and regular rhythm.  Heart sounds: Murmur heard.    Pulmonary:     Effort: Tachypnea present.     Breath sounds: Decreased breath sounds and rales present.     Comments: Speaking in 1 word sentences Abdominal:     General: Bowel sounds are normal.     Palpations: Abdomen is soft.     Tenderness: There is no abdominal tenderness. There is no guarding or rebound.     Comments: Large ventral hernia  Musculoskeletal:     Cervical back: Neck supple.     Right lower leg: No tenderness. No edema.     Left lower leg: No tenderness. No edema.  Skin:    General: Skin is warm.     ED Results / Procedures / Treatments   Labs (all labs ordered are listed, but only abnormal results are displayed) Labs Reviewed  CBC WITH DIFFERENTIAL/PLATELET - Abnormal; Notable for the following components:      Result Value   RBC 3.81 (*)    MCV 112.3 (*)    MCH 37.5 (*)    Lymphs Abs 0.5 (*)    All other components within normal limits  COMPREHENSIVE METABOLIC PANEL - Abnormal; Notable for the following components:   Chloride 77 (*)    CO2 47 (*)    Glucose, Bld 203 (*)    BUN 22 (*)    Total Protein 8.6 (*)     All other components within normal limits  BLOOD GAS, VENOUS - Abnormal; Notable for the following components:   pH, Ven 7.140 (*)    pCO2, Ven >120 (*)    pO2, Ven 123.0 (*)    All other components within normal limits  BRAIN NATRIURETIC PEPTIDE - Abnormal; Notable for the following components:   B Natriuretic Peptide 368.0 (*)    All other components within normal limits  CULTURE, BLOOD (SINGLE)  RESP PANEL BY RT-PCR (FLU A&B, COVID) ARPGX2  LACTIC ACID, PLASMA  LACTIC ACID, PLASMA  URINALYSIS, ROUTINE W REFLEX MICROSCOPIC  BLOOD GAS, ARTERIAL  TRIGLYCERIDES  I-STAT CHEM 8, ED  TROPONIN I (HIGH SENSITIVITY)  TROPONIN I (HIGH SENSITIVITY)    EKG EKG Interpretation  Date/Time:  Monday June 21 2020 18:39:18 EDT Ventricular Rate:  86 PR Interval:    QRS Duration: 200 QT Interval:  456 QTC Calculation: 546 R Axis:   53 Text Interpretation: Atrial fibrillation Right bundle branch block No significant change since last tracing Confirmed by Isla Pence [phone]) on 07/09/2020 8:25:46 PM   Radiology DG Chest Port 1 View  Result Date: 06/25/2020 CLINICAL DATA:  Shortness of breath and weakness. Patient is on BiPAP. EXAM: PORTABLE CHEST 1 VIEW COMPARISON:  07/02/2013 FINDINGS: Postoperative changes in the mediastinum. There is complete opacification of the right hemithorax. This could represent either or combination of pleural effusion, consolidation, and/or collapse. Perihilar infiltration on the left lung. Heart size is obscured by the right chest process. IMPRESSION: Complete opacification of the right hemithorax. Perihilar infiltration on the left. Electronically Signed   By: Lucienne Capers M.D.   On: 06/22/2020 20:30    Procedures Procedures   CRITICAL CARE Performed by: Rodney Booze   Total critical care time: 45 minutes  Critical care time was exclusive of separately billable procedures and treating other patients.  Critical care was necessary to treat or  prevent imminent or life-threatening deterioration.  Critical care was time spent personally by me on the following activities: development of treatment plan with patient and/or surrogate as well as nursing,  discussions with consultants, evaluation of patient's response to treatment, examination of patient, obtaining history from patient or surrogate, ordering and performing treatments and interventions, ordering and review of laboratory studies, ordering and review of radiographic studies, pulse oximetry and re-evaluation of patient's condition.   Medications Ordered in ED Medications  albuterol (VENTOLIN HFA) 108 (90 Base) MCG/ACT inhaler 2 puff (has no administration in time range)  propofol (DIPRIVAN) 1000 MG/100ML infusion (50 mcg/kg/min  68 kg Intravenous Rate/Dose Change 06/24/2020 2119)  etomidate (AMIDATE) injection (20 mg Intravenous Given 06/28/2020 2046)  succinylcholine (ANECTINE) injection (200 mg Intravenous Given 06/18/2020 2047)    ED Course  I have reviewed the triage vital signs and the nursing notes.  Pertinent labs & imaging results that were available during my care of the patient were reviewed by me and considered in my medical decision making (see chart for details).    MDM Rules/Calculators/A&P                          60 y/o M presenting for eval of increased confusion, weakness, sob, cough.   normally on 2L at home, requiring 6L today and remains tachyneic and unable to speak in full sentences. bipap started after initial eval  Reviewed/interpreted labs CBC - unremarkable CMP - with elevate bicarb, elevated bun Trop - neg BNP elevated  vbg low ph and elevated pco2  Lactic - neg Blood culture obtained UA - pending on admission covid neg   EKG - Atrial fibrillation Ventricular premature complex Right bundle branch block No old tracing to compare  Reviewed/interpreted imaging CXR -  Complete opacification of the right hemithorax. Perihilar infiltration on the  left. CT head - pending on admission   Pt desatting on bipap, decision made to intubate. Pt sats improved upon intubation. He will admitted to critical care.   At shift change, care transitioned to Dr Gilford Raid pending admission.   Final Clinical Impression(s) / ED Diagnoses Final diagnoses:  Acute on chronic respiratory failure with hypercapnia [facility])    Rx / DC Orders ED Discharge Orders    None       Bishop Dublin 07/10/2020 2122    Isla Pence, MD 06/20/2020 2344

## 2020-06-21 NOTE — ED Provider Notes (Signed)
  Physical Exam  BP (!) 154/69   Pulse 67   Temp 98.8 F (37.1 C) (Rectal)   Resp (!) 27   Ht 5\' 4"  (1.626 m)   Wt 68 kg   SpO2 100%   BMI 25.75 kg/m   Physical Exam  ED Course/Procedures     Procedure Name: Intubation Date/Time: 07/13/2020 9:25 PM Performed by: Isla Pence, MD Oxygen Delivery Method: Ambu bag Preoxygenation: Pre-oxygenation with 100% oxygen Induction Type: Rapid sequence Laryngoscope Size: Glidescope and 3 Tube size: 7.5 mm Number of attempts: 1 Secured at: 23 cm Dental Injury: Teeth and Oropharynx as per pre-operative assessment        MDM         Isla Pence, MD 07/15/2020 2126

## 2020-06-21 NOTE — ED Notes (Signed)
Called ac for fentanyl

## 2020-06-21 NOTE — ED Notes (Signed)
Medications being titrated r/t pt shaking and gagging over tubes.

## 2020-06-21 NOTE — Progress Notes (Signed)
Could not  hear no breath sounds on left side.  Patient's family stated that they did something to his lungs on that side during a procedure at [facility].

## 2020-06-21 NOTE — ED Notes (Addendum)
ETT pulled back 3 cm by RT

## 2020-06-21 NOTE — ED Notes (Signed)
Date and time results received: 07/10/2020 @ 2057 (use smartphrase ".now" to insert current time)  Test: ABG Critical Value: pH 7.140, pCO2 >120 Name of Provider Notified: Dr Gilford Raid Orders Received? No Or Actions Taken?: Pt currently being intubated

## 2020-06-21 NOTE — Progress Notes (Signed)
Transported patient to and from CT without incident.  Patient is currently in room resting comfortably on ventilator.

## 2020-06-21 NOTE — H&P (Signed)
History and Physical    Jay Skinner RUE:[id number] DOB: 1960-05-15 DOA: 07/12/2020  PCP: Sharilyn Sites, MD   Patient coming from: Home  I have personally briefly reviewed patient's old medical records in [facility]  Chief Complaint: Altered mental status  HPI: MOUA Jay Skinner is a 60 y.o. male with medical history significant for completely repaired tetralogy of Fallot , persistent atrial fibrillation , diabetes mellitus, GI bleed, hypertension, chronic respiratory failure on 2 L, paralyzed right diaphragm. History is obtained from chart review as at the time of evaluation patient is intubated.  Patient was brought to the ED via EMS.  Patient was recently discharged from [facility] after admission 3/24-3/27 for GI bleed.  Since discharge to home, patient has steadily declined.  Today patient's brother found patient sitting in his chair, sleeping and drooling.  At baseline he is alert and conversant.  Family also reported that patient has had a cough over the past several days.  Hospitalization here at [facility] 3/17 - 3/18-for acute upper GI bleed and acute gastroenteritis.  EGD was deferred for patient to follow-up with his gastroenterologist at [facility].  Hemoglobin remained stable at 11.8 during admission.  He was advised to stop taking his apixaban until he followed up with GI.  ED Course: 7.9.  Heart rate 67s to 80s.  Heart rate 24-32.  Blood pressure systolic 147-829.  O2 sats 91 to 97% on 6 L.  Venous blood gas showed pH of 7.1, VCO2 greater than 120.  BNP 368.  Lactic acid 1.1.  Initial portable chest x-ray showed-complete opacification of the right hemithorax, perihilar infiltration on the left.  Post intubation shunt chest x-ray shows significant improvement in right pleural effusion and aeration of right lung.  Patient was initially tried on BiPAP and subsequently intubated. ED provider talked to critical care, okay with admission at [facility].  Review of  Systems: Unable to assess as patient is intubated..  Past Medical History:  Diagnosis Date  . Arthritis   . Asthma   . Heart disease   . History of bleeding ulcers   . Hypertension   . Hypothyroidism     Past Surgical History:  Procedure Laterality Date  . ABDOMINAL SURGERY    . CARDIAC VALVE REPLACEMENT     [facility]   . COLONOSCOPY N/A 08/04/2015   Procedure: COLONOSCOPY;  Surgeon: Rogene Houston, MD;  Location: [facility];  Service: Endoscopy;  Laterality: N/A;  730  . CORONARY ARTERY BYPASS GRAFT    . LEG SURGERY Left    2008 , [facility]     reports that he has never smoked. He has never used smokeless tobacco. He reports that he does not drink alcohol and does not use drugs.  Allergies  Allergen Reactions  . Codeine Hives  . Lasix [Furosemide] Hives  . Latex Other (See Comments)  . Penicillins Dermatitis    Family History  Problem Relation Age of Onset  . Colon cancer Other     Prior to Admission medications   Medication Sig Start Date End Date Taking? Authorizing Provider  acetaminophen (TYLENOL) 325 MG tablet Take 650 mg by mouth every 4 (four) hours as needed for pain.    [provider]  amiodarone (PACERONE) 200 MG tablet Take 400 mg by mouth daily. 05/19/20 05/19/21  [provider]  bisacodyl (DULCOLAX) 5 MG EC tablet Take 5 mg by mouth daily as needed for mild constipation.    [provider]  bumetanide (BUMEX) 2 MG tablet Take 2 mg by mouth daily. 04/10/20 06/04/20  [provider]  calcitRIOL (ROCALTROL) 0.25 MCG capsule Take 0.25 mcg by mouth daily.    [provider]  calcium carbonate (TUMS - DOSED IN MG ELEMENTAL CALCIUM) 500 MG chewable tablet Chew 3 tablets by mouth daily.    [provider]  clindamycin (CLEOCIN) 300 MG capsule Take 300 mg by mouth as needed (Prior to dental work). 04/07/15   [provider]  D3 HIGH POTENCY 50 MCG (2000 UT) CAPS Take 2,000 Units by mouth daily. 04/13/20    [provider]  diclofenac Sodium (VOLTAREN) 1 % GEL Apply 1 application topically 2 (two) times daily as needed (knee pain).    [provider]  Docusate Sodium (DSS) 100 MG CAPS Take 200 mg by mouth at bedtime.    [provider]  ferrous sulfate 325 (65 FE) MG EC tablet Take 325 mg by mouth daily.    [provider]  levothyroxine (SYNTHROID) 50 MCG tablet Take 50 mcg by mouth daily. 04/12/20   [provider]  meclizine (ANTIVERT) 12.5 MG tablet Take 12.5 mg by mouth 3 (three) times daily as needed for dizziness. 05/19/20   [provider]  pantoprazole (PROTONIX) 40 MG tablet Take 40 mg by mouth 2 (two) times daily. 04/10/20   [provider]  potassium chloride SA (K-DUR,KLOR-CON) 20 MEQ tablet Take 1 tablet by mouth daily.  05/11/15   [provider]  spironolactone (ALDACTONE) 25 MG tablet Take 25 mg by mouth daily. 04/12/20   [provider]    Physical Exam: Limited exam as patient is intubated. Vitals:   07/02/2020 2125 06/27/2020 2130 07/06/2020 2135 07/11/2020 2140  BP: (!) 108/58 100/68 111/62 116/84  Pulse: 79 84 85 84  Resp: (!) 24 (!) 21 (!) 24 (!) 24  Temp:      TempSrc:      SpO2: 100% 100% 99% 92%  Weight:      Height:        Constitutional: intubated on mechanical ventilation Vitals:   07/14/2020 2125 07/13/2020 2130 07/05/2020 2135 06/29/2020 2140  BP: (!) 108/58 100/68 111/62 116/84  Pulse: 79 84 85 84  Resp: (!) 24 (!) 21 (!) 24 (!) 24  Temp:      TempSrc:      SpO2: 100% 100% 99% 92%  Weight:      Height:       Eyes: Pupils equal.  Lids and conjunctiva normal. ENMT: Intubated Neck: normal, supple, no masses, no thyromegaly Respiratory: Intubated.  Equal air entry bilaterally Cardiovascular: Irregular rate and rhythm,. No extremity edema. 2+ pedal pulses.   Abdomen: Abdomen mildly distended. Musculoskeletal:  No joint deformity upper and lower extremities. Good ROM, no contractures.   Skin:  no rashes, lesions, ulcers. No induration Neurologic: Intubated. Psychiatric: Intubated  Labs on Admission: I have personally reviewed following labs and imaging studies  CBC: Recent Labs  Lab 06/24/2020 1849  WBC 5.3  NEUTROABS 4.1  HGB 14.3  HCT 42.8  MCV 112.3*  PLT 505   Basic Metabolic Panel: Recent Labs  Lab 07/02/2020 1849  NA 138  K 4.6  CL 77*  CO2 47*  GLUCOSE 203*  BUN 22*  CREATININE 1.17  CALCIUM 9.3   Liver Function Tests: Recent Labs  Lab 07/06/2020 1849  AST 22  ALT 14  ALKPHOS 69  BILITOT 1.0  PROT 8.6*  ALBUMIN 4.3  Radiological Exams on Admission:  CT Head Wo Contrast  Result Date: 07/06/2020 CLINICAL DATA:  Shortness of breath and weakness. Mental status changes with unknown cause. EXAM: CT HEAD WITHOUT CONTRAST TECHNIQUE: Contiguous axial images were obtained from the base of the skull through the vertex without intravenous contrast. COMPARISON:  None. FINDINGS: Brain: No evidence of acute infarction, hemorrhage, hydrocephalus, extra-axial collection or mass lesion/mass effect. Vascular: No hyperdense vessel or unexpected calcification. Skull: The calvarium appears intact. Sinuses/Orbits: Paranasal sinuses and mastoid air cells are clear. 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 IMPRESSION: No acute intracranial abnormalities. Electronically Signed   By: Lucienne Capers M.D.   On: 06/26/2020 22:33   DG Chest Portable 1 View  Result Date: 06/26/2020 CLINICAL DATA:  60 year old male status post intubation. EXAM: PORTABLE CHEST 1 VIEW COMPARISON:  Earlier radiograph dated 07/04/2020. FINDINGS: Interval placement of an endotracheal tube. The tip of the tube is suboptimally visualized due to overlying median sternotomy wires and cardiac valve replacement. The tip however appears in the region of the carina. Recommend retraction by approximately 3 cm. Enteric tube extends below the diaphragm with tip beyond the inferior margin of the image.  Significant interval decrease in size of right pleural effusion and improvement in aeration of the right lung. No pneumothorax. Stable cardiomegaly. Atherosclerotic calcification of the aorta. Degenerative changes of the spine. No acute osseous pathology. IMPRESSION: 1. Interval placement of an endotracheal tube with tip close to the carina. Recommend retraction by approximately 3 cm. 2. Significant interval decrease in size of the right pleural effusion and improvement in aeration of the right lung. Electronically Signed   By: Anner Crete M.D.   On: 07/14/2020 21:29   DG Chest Port 1 View  Result Date: 06/19/2020 CLINICAL DATA:  Shortness of breath and weakness. Patient is on BiPAP. EXAM: PORTABLE CHEST 1 VIEW COMPARISON:  07/02/2013 FINDINGS: Postoperative changes in the mediastinum. There is complete opacification of the right hemithorax. This could represent either or combination of pleural effusion, consolidation, and/or collapse. Perihilar infiltration on the left lung. Heart size is obscured by the right chest process. IMPRESSION: Complete opacification of the right hemithorax. Perihilar infiltration on the left. Electronically Signed   By: Lucienne Capers M.D.   On: 06/30/2020 20:30    EKG: Independently reviewed.  Atrial fibrillation, rate 86, QTc 546.  Right bundle branch block  Assessment/Plan Principal Problem:   Acute respiratory failure with hypoxia and hypercapnia (HCC) Active Problems:   Gastrointestinal hemorrhage associated with gastric ulcer   HTN (hypertension)   Persistent atrial fibrillation (HCC)   Tetralogy of Fallot   Type 2 diabetes mellitus (HCC)   Acute on chronic hypoxic and hypercapnic respiratory failure/right pleural effusion-on mechanical ventilation initial chest x-ray showed complete opacification of right lung, post intubation chest x-ray shows significant interval improvement in size of right pleural effusion and aeration.  Serum bicarb elevated at 47.   History of paralyzed right hemidiaphragm -I talked to critical care Dr. Carson Myrtle,  [facility] to admit here at [facility].  Critical care to see in the morning.  - He has a history of prolonged CO2 retention per [facility] note.  Chest x-ray at [facility] done 3/27-showed stable moderate to large right pleural effusion. VBG- PCo2 elevated at 101 - 3/25 during recent hospitalization. -Continue IV propofol for sedation -As needed fentanyl -Lasix allergy noted, start IV bumetanide 1mg  BID - Pending PCCM eval, will need IR consult for US guided therapeutic thoracentesis in a.m, with pleural fluid studies if this needs  to be done after extubation. -Please consult critical care in a.m. -Chest x-ray in a.m. -Repeat ABG now and in a.m. -Obtain echocardiogram  Persistent atrial fibrillation-controlled.  Anticoagulation with apixaban held for now due to recent GI blood loss. -N.p.o. for now, amiodarone held for now resume as soon as able  Chronic diastolic right-sided heart failure- presenting with significant right pleural effusion/ pulmonary edema.  But no evidence of peripheral volume overload this time. BNP 368 - Obtain updated echo -Hold Bumex and spironolactone -IV  bumetanide 40 twice daily - Strict input output, daily weights -Daily BMP  Metabolic encephalopathy-secondary to hypercapnia-venous blood gas shows PCO2 greater than 120..  CT without acute abnormality.  History of GI bleed/PUD-hemoglobin 14.3,  with baseline ~11.  History of gastric ulcer on EGD status post clipping 03/2020. -CBC in a.m. -IV Protonix 40 twice daily  HTN-stable - Hold spironolactone and Bumex 2 mg daily for now  Controlled DM-random glucose 203.  Hemoglobin A1c 4.8.  Not on medication. - SSI- S q4h  Completely repaired tetralogy of Fallot. Follows with Dr. Alycia Rossetti at [facility]   DVT prophylaxis: SCDs with recent GI bleed Code Status: Full code Family Communication: None at bedside Disposition Plan: > 2 days Consults  called: IR for thoracentesis in a.m. Pls consult critical care in a.m Admission status: Inpt, ICU I certify that at the point of admission it is my clinical judgment that the patient will require inpatient hospital care spanning beyond 2 midnights from the point of admission due to high intensity of service, high risk for further deterioration and high frequency of surveillance required.   CRITICAL CARE Performed by: Bethena Roys   Total critical care time: 70 minutes  Critical care time was exclusive of separately billable procedures and treating other patients.  Critical care was necessary to treat or prevent imminent or life-threatening deterioration.  Critical care was time spent personally by me on the following activities: development of treatment plan with patient and/or surrogate as well as nursing, discussions with consultants, evaluation of patient's response to treatment, examination of patient, obtaining history from patient or surrogate, ordering and performing treatments and interventions, ordering and review of laboratory studies, ordering and review of radiographic studies, pulse oximetry and re-evaluation of patient's condition.   Bethena Roys MD [facility]  06/27/2020, 10:04 PM

## 2020-06-21 NOTE — ED Triage Notes (Signed)
Pt presents to ED with SOB and weakness. Pt usually on 2 lpm at home but today he is using 6 lpm. Pt went to [place] he came home pt continues to decline . Pt just wanting to sleep since he was d/c form [facility]. Cpap is used at night time. Confusion is noted.

## 2020-06-22 ENCOUNTER — Encounter (HOSPITAL_COMMUNITY): Payer: Self-pay | Admitting: Internal Medicine

## 2020-06-22 ENCOUNTER — Inpatient Hospital Stay (HOSPITAL_COMMUNITY): Payer: Medicare Other

## 2020-06-22 ENCOUNTER — Inpatient Hospital Stay (HOSPITAL_COMMUNITY)
Admit: 2020-06-22 | Discharge: 2020-06-22 | Disposition: A | Discharge status: Home / Self Care | Payer: Medicare Other | Attending: Family Medicine | Admitting: Family Medicine

## 2020-06-22 DIAGNOSIS — I50812 Chronic right heart failure: Secondary | ICD-10-CM | POA: Diagnosis not present

## 2020-06-22 DIAGNOSIS — R579 Shock, unspecified: Secondary | ICD-10-CM | POA: Diagnosis present

## 2020-06-22 DIAGNOSIS — J9622 Acute and chronic respiratory failure with hypercapnia: Secondary | ICD-10-CM

## 2020-06-22 DIAGNOSIS — J9621 Acute and chronic respiratory failure with hypoxia: Secondary | ICD-10-CM | POA: Diagnosis not present

## 2020-06-22 DIAGNOSIS — I159 Secondary hypertension, unspecified: Secondary | ICD-10-CM | POA: Diagnosis not present

## 2020-06-22 DIAGNOSIS — E874 Mixed disorder of acid-base balance: Secondary | ICD-10-CM | POA: Diagnosis not present

## 2020-06-22 DIAGNOSIS — Q213 Tetralogy of Fallot: Secondary | ICD-10-CM

## 2020-06-22 DIAGNOSIS — J9601 Acute respiratory failure with hypoxia: Secondary | ICD-10-CM | POA: Diagnosis not present

## 2020-06-22 DIAGNOSIS — I4819 Other persistent atrial fibrillation: Secondary | ICD-10-CM | POA: Diagnosis not present

## 2020-06-22 LAB — BLOOD GAS, ARTERIAL
Acid-Base Excess: 27.5 mmol/L — ABNORMAL HIGH (ref 0.0–2.0)
Acid-Base Excess: 25.6 mmol/L — ABNORMAL HIGH (ref 0.0–2.0)
Acid-Base Excess: 21.3 mmol/L — ABNORMAL HIGH (ref 0.0–2.0)
Bicarbonate: 52.1 mmol/L — ABNORMAL HIGH (ref 20.0–28.0)
Bicarbonate: 49.9 mmol/L — ABNORMAL HIGH (ref 20.0–28.0)
Bicarbonate: 44.6 mmol/L — ABNORMAL HIGH (ref 20.0–28.0)
Drawn by: 21310
FIO2: 100
FIO2: 70
FIO2: 70
MECHVT: 470 mL
O2 Saturation: 99.4 %
O2 Saturation: 99.2 %
O2 Saturation: 99.1 %
PEEP: 8 cmH2O
Patient temperature: 39.3
Patient temperature: 37.6
Patient temperature: 37.2
pCO2 arterial: 56.2 mmHg — ABNORMAL HIGH (ref 32.0–48.0)
pCO2 arterial: 49.1 mmHg — ABNORMAL HIGH (ref 32.0–48.0)
pCO2 arterial: 57.3 mmHg — ABNORMAL HIGH (ref 32.0–48.0)
pH, Arterial: 7.589 — ABNORMAL HIGH (ref 7.350–7.450)
pH, Arterial: 7.62 (ref 7.350–7.450)
pH, Arterial: 7.52 — ABNORMAL HIGH (ref 7.350–7.450)
pO2, Arterial: 196 mmHg — ABNORMAL HIGH (ref 83.0–108.0)
pO2, Arterial: 167 mmHg — ABNORMAL HIGH (ref 83.0–108.0)
pO2, Arterial: 169 mmHg — ABNORMAL HIGH (ref 83.0–108.0)

## 2020-06-22 LAB — CBC
HCT: 37.1 % — ABNORMAL LOW (ref 39.0–52.0)
Hemoglobin: 11.8 g/dL — ABNORMAL LOW (ref 13.0–17.0)
MCH: 35.3 pg — ABNORMAL HIGH (ref 26.0–34.0)
MCHC: 31.8 g/dL (ref 30.0–36.0)
MCV: 111.1 fL — ABNORMAL HIGH (ref 80.0–100.0)
Platelets: 144 10*3/uL — ABNORMAL LOW (ref 150–400)
RBC: 3.34 MIL/uL — ABNORMAL LOW (ref 4.22–5.81)
RDW: 13.5 % (ref 11.5–15.5)
WBC: 11.4 10*3/uL — ABNORMAL HIGH (ref 4.0–10.5)
nRBC: 0 % (ref 0.0–0.2)

## 2020-06-22 LAB — ECHOCARDIOGRAM COMPLETE
AR max vel: 1.77 cm2
AV Area VTI: 1.75 cm2
AV Area mean vel: 1.79 cm2
AV Mean grad: 6.4 mmHg
AV Peak grad: 12.8 mmHg
Ao pk vel: 1.79 m/s
Area-P 1/2: 3.82 cm2
Height: 64 in
S' Lateral: 3.2 cm
Weight: 2539.7 oz

## 2020-06-22 LAB — BASIC METABOLIC PANEL
Anion gap: 19 — ABNORMAL HIGH (ref 5–15)
BUN: 28 mg/dL — ABNORMAL HIGH (ref 6–20)
CO2: 43 mmol/L — ABNORMAL HIGH (ref 22–32)
Calcium: 8.7 mg/dL — ABNORMAL LOW (ref 8.9–10.3)
Chloride: 79 mmol/L — ABNORMAL LOW (ref 98–111)
Creatinine, Ser: 1.53 mg/dL — ABNORMAL HIGH (ref 0.61–1.24)
GFR, Estimated: 52 mL/min — ABNORMAL LOW (ref >60)
Glucose, Bld: 104 mg/dL — ABNORMAL HIGH (ref 70–99)
Potassium: 4 mmol/L (ref 3.5–5.1)
Sodium: 141 mmol/L (ref 135–145)

## 2020-06-22 LAB — PROCALCITONIN: Procalcitonin: 0.5 ng/mL

## 2020-06-22 LAB — GLUCOSE, CAPILLARY
Glucose-Capillary: 93 mg/dL (ref 70–99)
Glucose-Capillary: 72 mg/dL (ref 70–99)
Glucose-Capillary: 116 mg/dL — ABNORMAL HIGH (ref 70–99)
Glucose-Capillary: 106 mg/dL — ABNORMAL HIGH (ref 70–99)
Glucose-Capillary: 138 mg/dL — ABNORMAL HIGH (ref 70–99)
Glucose-Capillary: 146 mg/dL — ABNORMAL HIGH (ref 70–99)

## 2020-06-22 LAB — TRIGLYCERIDES: Triglycerides: 87 mg/dL (ref <150)

## 2020-06-22 LAB — MRSA PCR SCREENING: MRSA by PCR: NEGATIVE

## 2020-06-22 LAB — PHOSPHORUS: Phosphorus: 2 mg/dL — ABNORMAL LOW (ref 2.5–4.6)

## 2020-06-22 MED ORDER — SODIUM CHLORIDE 0.9 % IV BOLUS
1000.0000 mL | Freq: Once | INTRAVENOUS | Status: AC
  Administered 2020-06-22: 1000 mL via INTRAVENOUS

## 2020-06-22 MED ORDER — ACETAMINOPHEN 325 MG PO TABS
650.0000 mg | ORAL_TABLET | Freq: Four times a day (QID) | ORAL | Status: DC | PRN
  Administered 2020-06-28 – 2020-07-01 (×5): 650 mg via ORAL
  Filled 2020-06-22 (×5): qty 2

## 2020-06-22 MED ORDER — VANCOMYCIN HCL 1250 MG/250ML IV SOLN
1250.0000 mg | INTRAVENOUS | Status: DC
  Administered 2020-06-23 – 2020-06-25 (×3): 1250 mg via INTRAVENOUS
  Filled 2020-06-22 (×3): qty 250

## 2020-06-22 MED ORDER — PANTOPRAZOLE SODIUM 40 MG IV SOLR
40.0000 mg | Freq: Two times a day (BID) | INTRAVENOUS | Status: DC
  Administered 2020-06-22 – 2020-06-30 (×18): 40 mg via INTRAVENOUS
  Filled 2020-06-22 (×18): qty 40

## 2020-06-22 MED ORDER — SODIUM CHLORIDE 0.9 % IV SOLN: INTRAVENOUS | Status: DC

## 2020-06-22 MED ORDER — NOREPINEPHRINE 4 MG/250ML-% IV SOLN: 0.0000 ug/min | INTRAVENOUS | Status: DC

## 2020-06-22 MED ORDER — SODIUM CHLORIDE 0.9 % IV BOLUS
500.0000 mL | Freq: Once | INTRAVENOUS | Status: AC
  Administered 2020-06-22: 500 mL via INTRAVENOUS

## 2020-06-22 MED ORDER — SODIUM CHLORIDE 0.9 % IV BOLUS
250.0000 mL | Freq: Once | INTRAVENOUS | Status: AC
  Administered 2020-06-22: 250 mL via INTRAVENOUS

## 2020-06-22 MED ORDER — FENTANYL CITRATE (PF) 100 MCG/2ML IJ SOLN
50.0000 ug | INTRAMUSCULAR | Status: DC | PRN
  Administered 2020-06-22: 50 ug via INTRAVENOUS

## 2020-06-22 MED ORDER — CHLORHEXIDINE GLUCONATE 0.12% ORAL RINSE (MEDLINE KIT)
15.0000 mL | Freq: Two times a day (BID) | OROMUCOSAL | Status: DC
  Administered 2020-06-22 – 2020-07-01 (×20): 15 mL via OROMUCOSAL

## 2020-06-22 MED ORDER — SODIUM CHLORIDE 0.9 % IV SOLN
2.0000 g | Freq: Two times a day (BID) | INTRAVENOUS | Status: DC
  Administered 2020-06-22 (×2): 2 g via INTRAVENOUS
  Filled 2020-06-22 (×2): qty 2

## 2020-06-22 MED ORDER — ORAL CARE MOUTH RINSE
15.0000 mL | OROMUCOSAL | Status: DC
  Administered 2020-06-22 – 2020-07-01 (×96): 15 mL via OROMUCOSAL

## 2020-06-22 MED ORDER — DOCUSATE SODIUM 50 MG/5ML PO LIQD
100.0000 mg | Freq: Two times a day (BID) | ORAL | Status: DC
  Administered 2020-06-22 – 2020-06-30 (×16): 100 mg
  Filled 2020-06-22 (×17): qty 10

## 2020-06-22 MED ORDER — POLYETHYLENE GLYCOL 3350 17 G PO PACK
17.0000 g | PACK | Freq: Every day | ORAL | Status: DC | PRN
  Administered 2020-06-22 (×2): 17 g via ORAL

## 2020-06-22 MED ORDER — POLYETHYLENE GLYCOL 3350 17 G PO PACK
17.0000 g | PACK | Freq: Every day | ORAL | Status: DC
  Administered 2020-06-22 – 2020-06-29 (×8): 17 g
  Filled 2020-06-22 (×9): qty 1

## 2020-06-22 MED ORDER — VANCOMYCIN HCL 1500 MG/300ML IV SOLN
1500.0000 mg | Freq: Once | INTRAVENOUS | Status: AC
  Administered 2020-06-22: 1500 mg via INTRAVENOUS
  Filled 2020-06-22: qty 300

## 2020-06-22 MED ORDER — NOREPINEPHRINE 4 MG/250ML-% IV SOLN
2.0000 ug/min | INTRAVENOUS | Status: DC
Start: 2020-06-22 — End: 2020-06-27
  Administered 2020-06-22: 2 ug/min via INTRAVENOUS
  Administered 2020-06-22 – 2020-06-23 (×3): 10 ug/min via INTRAVENOUS
  Administered 2020-06-23: 8 ug/min via INTRAVENOUS
  Administered 2020-06-24: 3 ug/min via INTRAVENOUS
  Filled 2020-06-22 (×7): qty 250

## 2020-06-22 MED ORDER — ACETAMINOPHEN 650 MG RE SUPP
650.0000 mg | Freq: Four times a day (QID) | RECTAL | Status: DC | PRN
  Administered 2020-06-22 – 2020-06-29 (×2): 650 mg via RECTAL
  Filled 2020-06-22 (×2): qty 1

## 2020-06-22 MED ORDER — VITAL HIGH PROTEIN PO LIQD: 1000.0000 mL | ORAL | Status: DC

## 2020-06-22 MED ORDER — SODIUM CHLORIDE 0.9 % IV SOLN
250.0000 mL | INTRAVENOUS | Status: DC
  Administered 2020-06-22: 250 mL via INTRAVENOUS

## 2020-06-22 MED ORDER — PROSOURCE TF PO LIQD: 45.0000 mL | Freq: Two times a day (BID) | ORAL | Status: DC

## 2020-06-22 MED ORDER — VITAL AF 1.2 CAL PO LIQD
1000.0000 mL | ORAL | Status: DC
  Administered 2020-06-22 – 2020-06-28 (×5): 1000 mL

## 2020-06-22 MED ORDER — CHLORHEXIDINE GLUCONATE CLOTH 2 % EX PADS
6.0000 | MEDICATED_PAD | Freq: Every day | CUTANEOUS | Status: DC
  Administered 2020-06-22 – 2020-06-30 (×8): 6 via TOPICAL

## 2020-06-22 MED ORDER — AMIODARONE HCL 200 MG PO TABS
400.0000 mg | ORAL_TABLET | Freq: Every day | ORAL | Status: DC
  Administered 2020-06-22 – 2020-06-24 (×3): 400 mg
  Filled 2020-06-22 (×3): qty 2

## 2020-06-22 MED ORDER — INSULIN ASPART 100 UNIT/ML ~~LOC~~ SOLN
0.0000 [IU] | SUBCUTANEOUS | Status: DC
  Administered 2020-06-22 – 2020-06-23 (×4): 1 [IU] via SUBCUTANEOUS
  Administered 2020-06-23: 2 [IU] via SUBCUTANEOUS
  Administered 2020-06-23 (×2): 1 [IU] via SUBCUTANEOUS
  Administered 2020-06-23: 2 [IU] via SUBCUTANEOUS
  Administered 2020-06-24: 1 [IU] via SUBCUTANEOUS
  Administered 2020-06-24 (×4): 2 [IU] via SUBCUTANEOUS

## 2020-06-22 MED ORDER — FENTANYL CITRATE (PF) 100 MCG/2ML IJ SOLN: 50.0000 ug | INTRAMUSCULAR | Status: DC | PRN

## 2020-06-22 MED ORDER — LEVOTHYROXINE SODIUM 25 MCG PO TABS
50.0000 ug | ORAL_TABLET | Freq: Every day | ORAL | Status: DC
  Administered 2020-06-22 – 2020-07-01 (×10): 50 ug
  Filled 2020-06-22 (×10): qty 2

## 2020-06-22 NOTE — Progress Notes (Signed)
eLink Physician-Brief Progress Note Patient Name: [name] DOB: [birth year] MRN: [id number]   Date of Service  06/22/2020  HPI/Events of Note  Patient intubated and mechanically ventilated for acute on chronic hypoxemic, hypercapnic respiratory failure in the context of a large right pleural effusion, likely underlying compressive atelectasis, and altered mental status secondary to CO2 narcosis. Past history is significant for tetralogy of fallot s/p open heart surgery.                                                                                                             eICU Interventions  New Patient Evaluation completed.        Tnya Ades U Shantinique Picazo 06/22/2020, 1:19 AM

## 2020-06-22 NOTE — Consult Note (Signed)
NAME:  Jay Skinner, MRN:  [id number], DOB:  November 29, 1960, LOS: 1 ADMISSION DATE:  07/14/2020, CONSULTATION DATE:  4/5 REFERRING MD:  Aline August CHIEF COMPLAINT:  Acute on chronic hypoxemic/hypercarbic Resp failure  Brief patient profile:    History of Present Illness:   60 y.o. male with medical history significant for completely repaired tetralogy of Fallot , persistent atrial fibrillation , diabetes mellitus, GI bleed, hypertension, chronic respiratory failure on 2 L, paralyzed right diaphragm. History is obtained from chart review as at the time of evaluation patient is intubated.  Patient was brought to the ED via EMS.  Patient was recently discharged from [facility] after admission 3/24-3/27 for GI bleed.  Since discharge to home, patient has steadily declined.  Today patient's brother found patient sitting in his chair, sleeping and drooling.  At baseline he is alert and conversant.  Family also reported that patient has had a cough over the past several days.  Hospitalization here at [facility] 3/17 - 3/18-for acute upper GI bleed and acute gastroenteritis.  EGD was deferred for patient to follow-up with his gastroenterologist at [facility].  Hemoglobin remained stable at 11.8 during admission.  He was advised to stop taking his apixaban until he followed up with GI.  ED Course:  Heart rate 67s to 80s.  resp rate 24-32.  Blood pressure systolic 878-676.  O2 sats 91 to 97% on 6 L.  Venous blood gas showed pH of 7.1, VCO2 greater than 120.  BNP 368.  Lactic acid 1.1.  Initial portable chest x-ray showed-complete opacification of the right hemithorax, perihilar infiltration on the left.  Post intubation shunt chest x-ray shows significant improvement in   aeration of right lung.  Patient was initially tried on BiPAP and subsequently intubated. ED provider talked to critical care, okay with admission at [facility].  Pertinent  Medical History  Arthritis Asthma Heart disease sp   completely repaired tetralogy of Fallot History of bleeding ulcers Hypertension Hypothyroidism  Significant Hospital Events: Including procedures, antibiotic start and stop dates in addition to other pertinent events   . Resp viral panel 4/4 > neg covid/ flu  . BC x 1  4/4 >>> . ET 4/4 >> . BC x 2 4/5 >>> . MRSA PCR 4/5>  Neg . Resp culture/et >>>  . Maxepime 4/6 >>> . Vanc IV 4/6  >>   Scheduled Meds: . amiodarone  400 mg Per Tube Daily  . chlorhexidine gluconate (MEDLINE KIT)  15 mL Mouth Rinse BID  . docusate  100 mg Per Tube BID  . insulin aspart  0-9 Units Subcutaneous Q4H  . levothyroxine  50 mcg Per Tube Daily  . mouth rinse  15 mL Mouth Rinse 10 times per day  . pantoprazole (PROTONIX) IV  40 mg Intravenous Q12H  . polyethylene glycol  17 g Per Tube Daily   Continuous Infusions: . sodium chloride 250 mL (06/22/20 0650)  . sodium chloride    . ceFEPime (MAXIPIME) IV    . norepinephrine (LEVOPHED) Adult infusion 2 mcg/min (06/22/20 0411)  . propofol (DIPRIVAN) infusion 35 mcg/kg/min (06/22/20 0244)  . vancomycin     Followed by  . [START ON 06/23/2020] vancomycin     PRN Meds:.acetaminophen **OR** acetaminophen, albuterol, fentaNYL (SUBLIMAZE) injection, fentaNYL (SUBLIMAZE) injection, polyethylene glycol    Interim History / Subjective:  Sedated on vent, tremulous when disturbed vs chill  (no nystagmus to suggest sz)   Objective   Blood pressure (!) 91/37, pulse 80,  temperature (!) 100.9 F (38.3 C), temperature source Axillary, resp. rate (!) 28, height $RemoveBe'5\' 4"'TYFzOTzBh$  (1.626 m), weight 72 kg, SpO2 100 %.    Vent Mode: PRVC FiO2 (%):  [60 %-100 %] 70 % Set Rate:  [24 bmp-28 bmp] 24 bmp Vt Set:  [470 mL] 470 mL PEEP:  [5 cmH20-8 cmH20] 8 cmH20 Plateau Pressure:  [28 cmH20-36 cmH20] 29 cmH20   Intake/Output Summary (Last 24 hours) at 06/22/2020 0932 Last data filed at 06/22/2020 0800 Gross per 24 hour  Intake 445.86 ml  Output 250 ml  Net 195.86 ml   Filed Weights    06/28/2020 1838 06/22/20 0100  Weight: 68 kg 72 kg    Examination: Tmax 102.8  General: chronically ill/ intubated and sedated HENT: oral et Lungs: minima exp rhonchi  Cardiovascular: RRR no s3 Abdomen: mod distended soft Extremities: warm s edema Neuro: sedated    I personally reviewed images and agree with radiology impression as follows:  CXR:   Portable 4/5 Et ok/ improved aeration   Labs/imaging that I havepersonally reviewed  (right click and "Reselect all SmartList Selections" daily)  Today's labs/ cxr/ culture data   Resolved Hospital Problem list      Assessment & Plan:  1) Acute on chronic hypercabic and hypoxemic Resp failure with post hypercapneic met alkalosis >>> reduce back up rate to prevent alkalemia > repeat abgs ordered  2) Prob HCAP vs asp pna on R in pt with chronically elevated RHD ? Etiology  >>> cultured/ abx as above   3) Fever with chills vs tremors s evidence sz or serotonin syndrome.  >> rx tylenol   4) Circulatory shock >> suspect sepsis but probably relatively dry as well > wean off levophed asap / vol challenge x one liter NS  5) AKI secondary to 4 Lab Results  Component Value Date   CREATININE 1.53 (H) 06/22/2020   CREATININE 1.17 07/14/2020   CREATININE 1.19 06/04/2020   >> vol expand    Best practice (right click and "Reselect all SmartList Selections" daily)  Diet:  NPO Pain/Anxiety/Delirium protocol (if indicated): Yes (RASS goal -1) VAP protocol (if indicated): Yes DVT prophylaxis: SCD GI prophylaxis: PPI Glucose control:  SSI No Central venous access:  N/A Arterial line:  N/A Foley:  N/A Mobility:  OOB  PT consulted: N/A Last date of multidisciplinary goals of care discussion per [facility] Code Status:  full code Disposition: ICU   Labs   CBC: Recent Labs  Lab 06/20/2020 1849 06/22/20 0610  WBC 5.3 11.4*  NEUTROABS 4.1  --   HGB 14.3 11.8*  HCT 42.8 37.1*  MCV 112.3* 111.1*  PLT 152 144*    Basic Metabolic  Panel: Recent Labs  Lab 07/12/2020 1849 06/22/20 0610  NA 138 141  K 4.6 4.0  CL 77* 79*  CO2 47* 43*  GLUCOSE 203* 104*  BUN 22* 28*  CREATININE 1.17 1.53*  CALCIUM 9.3 8.7*   GFR: Estimated Creatinine Clearance: 47.3 mL/min (A) (by C-G formula based on SCr of 1.53 mg/dL (H)). Recent Labs  Lab 06/26/2020 1849 07/02/2020 2127 06/22/20 0610  PROCALCITON  --   --  0.50  WBC 5.3  --  11.4*  LATICACIDVEN 1.1 1.8  --     Liver Function Tests: Recent Labs  Lab 07/08/2020 1849  AST 22  ALT 14  ALKPHOS 69  BILITOT 1.0  PROT 8.6*  ALBUMIN 4.3   No results for input(s): LIPASE, AMYLASE in the last 168 hours. No results  for input(s): AMMONIA in the last 168 hours.  ABG    Component Value Date/Time   PHART 7.589 (H) 06/22/2020 0315   PCO2ART 56.2 (H) 06/22/2020 0315   PO2ART 196 (H) 06/22/2020 0315   HCO3 52.1 (H) 06/22/2020 0315   ACIDBASEDEF NOT CALCULATED 06/30/2020 2030   O2SAT 99.4 06/22/2020 0315     Coagulation Profile: No results for input(s): INR, PROTIME in the last 168 hours.  Cardiac Enzymes: No results for input(s): CKTOTAL, CKMB, CKMBINDEX, TROPONINI in the last 168 hours.  HbA1C: No results found for: HGBA1C  CBG: Recent Labs  Lab 06/22/20 0103 06/22/20 0406 06/22/20 0723  GLUCAP 93 72 116*       Past Medical History:  He,Arthritis, Asthma, Heart disease, History of bleeding ulcers, Hypertension, and Hypothyroidism.   Surgical History:   Past Surgical History:  Procedure Laterality Date  . ABDOMINAL SURGERY    . CARDIAC VALVE REPLACEMENT     [facility]   . COLONOSCOPY N/A 08/04/2015   Procedure: COLONOSCOPY;  Surgeon: Rogene Houston, MD;  Location: [facility];  Service: Endoscopy;  Laterality: N/A;  730  . CORONARY ARTERY BYPASS GRAFT    . LEG SURGERY Left    2008 , [facility]     Social History:   reports that he has never smoked. He has never used smokeless tobacco. He reports that he does not drink alcohol and does not use drugs.    Family History:  His family history includes Colon cancer in an other family member.   Allergies Allergies  Allergen Reactions  . Codeine Hives  . Lasix [Furosemide] Hives  . Latex Other (See Comments)  . Penicillins Dermatitis     Home Medications  Prior to Admission medications   Medication Sig Start Date End Date Taking? Authorizing Provider  acetaminophen (TYLENOL) 325 MG tablet Take 650 mg by mouth every 4 (four) hours as needed for pain.    [provider]  amiodarone (PACERONE) 200 MG tablet Take 400 mg by mouth daily. 05/19/20 05/19/21  [provider]  bisacodyl (DULCOLAX) 5 MG EC tablet Take 5 mg by mouth daily as needed for mild constipation.    [provider]  bumetanide (BUMEX) 2 MG tablet Take 2 mg by mouth daily. 04/10/20 06/04/20  [provider]  calcitRIOL (ROCALTROL) 0.25 MCG capsule Take 0.25 mcg by mouth daily.    [provider]  calcium carbonate (TUMS - DOSED IN MG ELEMENTAL CALCIUM) 500 MG chewable tablet Chew 3 tablets by mouth daily.    [provider]  clindamycin (CLEOCIN) 300 MG capsule Take 300 mg by mouth as needed (Prior to dental work). 04/07/15   [provider]  D3 HIGH POTENCY 50 MCG (2000 UT) CAPS Take 2,000 Units by mouth daily. 04/13/20   [provider]  diclofenac Sodium (VOLTAREN) 1 % GEL Apply 1 application topically 2 (two) times daily as needed (knee pain).    [provider]  Docusate Sodium (DSS) 100 MG CAPS Take 200 mg by mouth at bedtime.    [provider]  ferrous sulfate 325 (65 FE) MG EC tablet Take 325 mg by mouth daily.    [provider]  levothyroxine (SYNTHROID) 50 MCG tablet Take 50 mcg by mouth daily. 04/12/20   [provider]  meclizine (ANTIVERT) 12.5 MG tablet Take 12.5 mg by mouth 3 (three) times daily as needed for dizziness. 05/19/20   [provider]  pantoprazole (PROTONIX) 40 MG tablet Take  40 mg by mouth 2  (two) times daily. 04/10/20   [provider]  potassium chloride SA (K-DUR,KLOR-CON) 20 MEQ tablet Take 1 tablet by mouth daily.  05/11/15   [provider]  spironolactone (ALDACTONE) 25 MG tablet Take 25 mg by mouth daily. 04/12/20   [provider]      The patient is critically ill with multiple organ systems failure and requires high complexity decision making for assessment and support, frequent evaluation and titration of therapies, application of advanced monitoring technologies and extensive interpretation of multiple databases. Critical Care Time devoted to patient care services described in this note is 45 minutes.    Christinia Gully, MD Pulmonary and [place] [phone]   After 7:00 pm call Elink  [phone]

## 2020-06-22 NOTE — Progress Notes (Signed)
*  PRELIMINARY RESULTS* Echocardiogram 2D Echocardiogram has been performed.  Jay Skinner 06/22/2020, 11:36 AM

## 2020-06-22 NOTE — Progress Notes (Signed)
EEG Completed; Results Pending  

## 2020-06-22 NOTE — ED Notes (Signed)
BP restricted on the left side but can still have sticks

## 2020-06-22 NOTE — Progress Notes (Addendum)
PROGRESS NOTE   Jay Skinner  MST:[id number] DOB: [birth year] DOA: 06/23/2020 PCP: Assunta Found, MD   Chief Complaint  Patient presents with  . Shortness of Breath   Level of care: ICU  Brief Admission History:  60 y.o. male with medical history significant for completely repaired tetralogy of Fallot , persistent atrial fibrillation , diabetes mellitus, GI bleed, hypertension, chronic respiratory failure on 2 L, paralyzed right diaphragm. History is obtained from chart review as at the time of evaluation patient is intubated.  Pt was brought to the ED via EMS.  Patient was recently discharged from [facility] after admission 3/24-3/27 for GI bleed.  Since discharge to home, patient has steadily declined.  Patient's brother found patient sitting in his chair, sleeping and drooling.  At baseline he is alert and conversant.  Family also reported that patient has had a cough over the past several days.  He was admitted with circulatory shock likely sepsis, pneumonia, acute respiratory failure.    Assessment & Plan:   Principal Problem:   Acute respiratory failure with hypoxia and hypercapnia (HCC) Active Problems:   Gastrointestinal hemorrhage associated with gastric ulcer   HTN (hypertension)   Persistent atrial fibrillation (HCC)   Tetralogy of Fallot   Type 2 diabetes mellitus (HCC)   Shock circulatory (HCC)   Metabolic alkalosis with respiratory acidosis   Circulatory shock  - suspect sepsis from pneumonia - DC bumex IV and give fluids - IV fluid hydration ordered.   - continue levophed vasopressor therapy, wean off as possible - Follow blood cultures - Add broad spectrum antibiotic coverage  - check procalcitonin and trend  Acute respiratory failure with hypoxia and hypercapnia - Pulmonary critical care consultation - continue vent settings, trying to avoid over-ventilation - IV fentanyl and propofol ordered for sedation - discuss need for thoracentesis with PCCM.     Right pleural Effusion - since patient is on vent, I have asked for PCCM to evaluate appropriateness for attempting a thora  Persistent atrial fibrillation  - resume home amiodarone thru G tube if possible - his apixaban had been on hold temporarily for recent GI bleeding  Chronic diastolic right sided heart failure - Hold bumex and spironolactone temporarily - Follow I/O, weights, electrolytes  History of recent GI bleeding - IV protonix 40 mg BID for GI protection ordered  Type 2 DM  - had been diet controlled - ICU SSI protocol Q4h  Repaired Tetralogy of Fallot - Follow[facility] by Dr. Duard [facility] at [facility]  DVT prophylaxis: SCD Code Status: Full  Family Communication:  Disposition: TBD Status is: Inpatient  Remains inpatient appropriate because:IV treatments appropriate due to intensity of illness or inability to take PO and Inpatient level of care appropriate due to severity of illness   Dispo: The patient is from: Home              Anticipated d/c is to: TBD              Patient currently is not medically stable to d/c.   Difficult to place patient No  Consultants:   PCCM critical care  Procedures:     Antimicrobials:  Cefepime 4/5>> Vancomycin 4/5>>   Subjective: Pt sedated on vent.   Objective: Vitals:   06/22/20 0830 06/22/20 0900 06/22/20 1000 06/22/20 1100  BP: (!) 91/37 (!) 84/47 (!) 86/58 97/70  Pulse: 80 82 86 81  Resp: (!) 28 (!) 25 (!) 21 (!) 24  Temp:      TempSrc:  SpO2: 100% 100% 100% 100%  Weight:      Height:        Intake/Output Summary (Last 24 hours) at 06/22/2020 1129 Last data filed at 06/22/2020 0800 Gross per 24 hour  Intake 445.86 ml  Output 250 ml  Net 195.86 ml   Filed Weights   07/10/2020 1838 06/22/20 0100  Weight: 68 kg 72 kg    Examination:  General exam: Pt sedated, intubated on vent, minimally responsive.  Respiratory system: rales LLL. Intubated on vent. Cardiovascular system: normal S1 & S2 heard. No JVD,  murmurs, rubs, gallops or clicks. trace pedal edema. Gastrointestinal system: Abdomen is nondistended, soft and no organomegaly or masses felt. Normal bowel sounds heard. Central nervous system: sedated on vent. No gross focal neurological deficits. Extremities: no cyanosis seen. Skin: No gross lesions seen.  Psychiatry: unable to assess.   Data Reviewed: I have personally reviewed following labs and imaging studies  CBC: Recent Labs  Lab 07/06/2020 1849 06/22/20 0610  WBC 5.3 11.4*  NEUTROABS 4.1  --   HGB 14.3 11.8*  HCT 42.8 37.1*  MCV 112.3* 111.1*  PLT 152 144*    Basic Metabolic Panel: Recent Labs  Lab 06/28/2020 1849 06/22/20 0610  NA 138 141  K 4.6 4.0  CL 77* 79*  CO2 47* 43*  GLUCOSE 203* 104*  BUN 22* 28*  CREATININE 1.17 1.53*  CALCIUM 9.3 8.7*    GFR: Estimated Creatinine Clearance: 47.3 mL/min (A) (by C-G formula based on SCr of 1.53 mg/dL (H)).  Liver Function Tests: Recent Labs  Lab 06/29/2020 1849  AST 22  ALT 14  ALKPHOS 69  BILITOT 1.0  PROT 8.6*  ALBUMIN 4.3    CBG: Recent Labs  Lab 06/22/20 0103 06/22/20 0406 06/22/20 0723  GLUCAP 93 72 116*    Recent Results (from the past 240 hour(s))  Resp Panel by RT-PCR (Flu A&B, Covid) Nasopharyngeal Swab     Status: None   Collection Time: 07/13/2020  7:04 PM   Specimen: Nasopharyngeal Swab; Nasopharyngeal(NP) swabs in vial transport medium  Result Value Ref Range Status   SARS Coronavirus 2 by RT PCR NEGATIVE NEGATIVE Final    Comment: (NOTE) SARS-CoV-2 target nucleic acids are NOT DETECTED.  The SARS-CoV-2 RNA is generally detectable in upper respiratory specimens during the acute phase of infection. The lowest concentration of SARS-CoV-2 viral copies this assay can detect is 138 copies/mL. A negative result does not preclude SARS-Cov-2 infection and should not be used as the sole basis for treatment or other patient management decisions. A negative result may occur with  improper  specimen collection/handling, submission of specimen other than nasopharyngeal swab, presence of viral mutation(s) within the areas targeted by this assay, and inadequate number of viral copies(<138 copies/mL). A negative result must be combined with clinical observations, patient history, and epidemiological information. The expected result is Negative.  Fact Sheet for Patients:  EntrepreneurPulse.com.au  Fact Sheet for Healthcare Providers:  IncredibleEmployment.be  This test is no t yet approved or cleared by the [place] FDA and  has been authorized for detection and/or diagnosis of SARS-CoV-2 by FDA under an Emergency Use Authorization (EUA). This EUA will remain  in effect (meaning this test can be used) for the duration of the COVID-19 declaration under Section 564(b)(1) of the Act, 21 U.S.C.section 360bbb-3(b)(1), unless the authorization is terminated  or revoked sooner.       Influenza A by PCR NEGATIVE NEGATIVE Final   Influenza B by PCR NEGATIVE  NEGATIVE Final    Comment: (NOTE) The Xpert Xpress SARS-CoV-2/FLU/RSV plus assay is intended as an aid in the diagnosis of influenza from Nasopharyngeal swab specimens and should not be used as a sole basis for treatment. Nasal washings and aspirates are unacceptable for Xpert Xpress SARS-CoV-2/FLU/RSV testing.  Fact Sheet for Patients: EntrepreneurPulse.com.au  Fact Sheet for Healthcare Providers: IncredibleEmployment.be  This test is not yet approved or cleared by the [place] FDA and has been authorized for detection and/or diagnosis of SARS-CoV-2 by FDA under an Emergency Use Authorization (EUA). This EUA will remain in effect (meaning this test can be used) for the duration of the COVID-19 declaration under Section 564(b)(1) of the Act, 21 U.S.C. section 360bbb-3(b)(1), unless the authorization is terminated or revoked.  Performed at  [facility], [address] [place], [place] [address]   Culture, blood (single)     Status: None (Preliminary result)   Collection Time: 06/24/2020  8:28 PM   Specimen: BLOOD RIGHT HAND  Result Value Ref Range Status   Specimen Description BLOOD RIGHT HAND  Final   Special Requests   Final    BOTTLES DRAWN AEROBIC AND ANAEROBIC Blood Culture adequate volume   Culture   Final    NO GROWTH < 12 HOURS Performed at [facility], [address], [place], [place] [address]    Report Status PENDING  Incomplete  MRSA PCR Screening     Status: None   Collection Time: 06/22/20 12:29 AM   Specimen: Nasal Mucosa; Nasopharyngeal  Result Value Ref Range Status   MRSA by PCR NEGATIVE NEGATIVE Final    Comment:        The GeneXpert MRSA Assay (FDA approved for NASAL specimens only), is one component of a comprehensive MRSA colonization surveillance program. It is not intended to diagnose MRSA infection nor to guide or monitor treatment for MRSA infections. Performed at [facility], [address], [place], [place] [address]   Culture, blood (routine x 2)     Status: None (Preliminary result)   Collection Time: 06/22/20  6:00 AM   Specimen: Left Antecubital; Blood  Result Value Ref Range Status   Specimen Description LEFT ANTECUBITAL  Final   Special Requests   Final    BOTTLES DRAWN AEROBIC AND ANAEROBIC Blood Culture adequate volume   Culture   Final    NO GROWTH <12 HOURS Performed at [facility], [address], [place], [place] [address]    Report Status PENDING  Incomplete  Culture, blood (routine x 2)     Status: None (Preliminary result)   Collection Time: 06/22/20  6:09 AM   Specimen: BLOOD LEFT HAND  Result Value Ref Range Status   Specimen Description BLOOD LEFT HAND  Final   Special Requests   Final    BOTTLES DRAWN AEROBIC AND ANAEROBIC Blood Culture adequate volume   Culture   Final    NO GROWTH <12 HOURS Performed at [facility], [address], [place], [place]  [address]    Report Status PENDING  Incomplete     Radiology Studies: CT Head Wo Contrast  Result Date: 07/15/2020 CLINICAL DATA:  Shortness of breath and weakness. Mental status changes with unknown cause. EXAM: CT HEAD WITHOUT CONTRAST TECHNIQUE: Contiguous axial images were obtained from the base of the skull through the vertex without intravenous contrast. COMPARISON:  None. FINDINGS: Brain: No evidence of acute infarction, hemorrhage, hydrocephalus, extra-axial collection or mass lesion/mass effect. Vascular: No hyperdense vessel or unexpected calcification. Skull: The  calvarium appears intact. Sinuses/Orbits: Paranasal sinuses and mastoid air cells are clear. 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 IMPRESSION: No acute intracranial abnormalities. Electronically Signed   By: Lucienne Capers M.D.   On: 07/06/2020 22:33   Portable Chest xray  Result Date: 06/22/2020 CLINICAL DATA:  60 year old male intubated . GI hemorrhage. EXAM: PORTABLE CHEST 1 VIEW COMPARISON:  Portable chest 06/29/2020 and earlier. FINDINGS: Portable AP semi upright view at 0506 hours. Enteric tube courses to the abdomen with side hole at the level of the gastric body. Endotracheal tube tip in satisfactory position between the clavicles and carina. Stable cardiomegaly and mediastinal contours. Chronic postoperative changes to the mediastinum. Veiling opacity at the right lung base is stable to mildly increased. No pneumothorax. Pulmonary vascularity appears stable without overt edema. Negative left lung base. Paucity of bowel gas in the upper abdomen. No acute osseous abnormality identified. IMPRESSION: 1. Endotracheal and enteric tube in satisfactory position. 2. Right pleural effusion suspected and stable to mildly increased since yesterday. Stable cardiomegaly. Electronically Signed   By: Genevie Ann M.D.   On: 06/22/2020 05:33   DG Chest Portable 1 View  Result Date: 07/09/2020 CLINICAL DATA:  60 year old male status  post intubation. EXAM: PORTABLE CHEST 1 VIEW COMPARISON:  Earlier radiograph dated 06/30/2020. FINDINGS: Interval placement of an endotracheal tube. The tip of the tube is suboptimally visualized due to overlying median sternotomy wires and cardiac valve replacement. The tip however appears in the region of the carina. Recommend retraction by approximately 3 cm. Enteric tube extends below the diaphragm with tip beyond the inferior margin of the image. Significant interval decrease in size of right pleural effusion and improvement in aeration of the right lung. No pneumothorax. Stable cardiomegaly. Atherosclerotic calcification of the aorta. Degenerative changes of the spine. No acute osseous pathology. IMPRESSION: 1. Interval placement of an endotracheal tube with tip close to the carina. Recommend retraction by approximately 3 cm. 2. Significant interval decrease in size of the right pleural effusion and improvement in aeration of the right lung. Electronically Signed   By: Anner Crete M.D.   On: 07/15/2020 21:29   DG Chest Port 1 View  Result Date: 07/13/2020 CLINICAL DATA:  Shortness of breath and weakness. Patient is on BiPAP. EXAM: PORTABLE CHEST 1 VIEW COMPARISON:  07/02/2013 FINDINGS: Postoperative changes in the mediastinum. There is complete opacification of the right hemithorax. This could represent either or combination of pleural effusion, consolidation, and/or collapse. Perihilar infiltration on the left lung. Heart size is obscured by the right chest process. IMPRESSION: Complete opacification of the right hemithorax. Perihilar infiltration on the left. Electronically Signed   By: Lucienne Capers M.D.   On: 06/28/2020 20:30    Scheduled Meds: . amiodarone  400 mg Per Tube Daily  . chlorhexidine gluconate (MEDLINE KIT)  15 mL Mouth Rinse BID  . Chlorhexidine Gluconate Cloth  6 each Topical Daily  . docusate  100 mg Per Tube BID  . insulin aspart  0-9 Units Subcutaneous Q4H  .  levothyroxine  50 mcg Per Tube Daily  . mouth rinse  15 mL Mouth Rinse 10 times per day  . pantoprazole (PROTONIX) IV  40 mg Intravenous Q12H  . polyethylene glycol  17 g Per Tube Daily   Continuous Infusions: . sodium chloride 250 mL (06/22/20 0650)  . sodium chloride 100 mL/hr at 06/22/20 0937  . ceFEPime (MAXIPIME) IV 2 g (06/22/20 0958)  . feeding supplement (VITAL AF 1.2 CAL)    .  norepinephrine (LEVOPHED) Adult infusion 2 mcg/min (06/22/20 0411)  . propofol (DIPRIVAN) infusion 35 mcg/kg/min (06/22/20 0244)  . vancomycin 1,500 mg (06/22/20 1038)   Followed by  . [START ON 06/23/2020] vancomycin       LOS: 1 day   Critical Care Procedure Note Authorized and Performed by: Murvin Natal MD  Total Critical Care time:  50 mins Due to a high probability of clinically significant, life threatening deterioration, the patient required my highest level of preparedness to intervene emergently and I personally spent this critical care time directly and personally managing the patient.  This critical care time included obtaining a history; examining the patient, pulse oximetry; ordering and review of studies; arranging urgent treatment with development of a management plan; evaluation of patient's response of treatment; frequent reassessment; and discussions with other providers.  This critical care time was performed to assess and manage the high probability of imminent and life threatening deterioration that could result in multi-organ failure.  It was exclusive of separately billable procedures and treating other patients and teaching time.    Jay Brakeman, MD How to contact the [facility] Attending or Consulting provider [place] or covering provider during after hours [place], for this patient?  1. Check the care team in [facility] and look for a) attending/consulting [facility] provider listed and b) the [facility] team listed 2. Log into [email or web address] and use [place]'s universal password to access. If you do not  have the password, please contact the hospital operator. 3. Locate the [facility] provider you are looking for under [facility] and page to a number that you can be directly reached. 4. If you still have difficulty reaching the provider, please page the [facility] (Director on Call) for the Hospitalists listed on amion for assistance.  06/22/2020, 11:29 AM

## 2020-06-22 NOTE — Progress Notes (Signed)
Patients brother -Sonia Side updated on patients status and plan of care explained.   E.Rosselyn Martha, MD.  TR[facility].

## 2020-06-22 NOTE — Progress Notes (Signed)
Pharmacy Antibiotic Note  Jay Skinner is a 60 y.o. male admitted on 06/30/2020 with sepsis.  Pharmacy has been consulted for Vancomycin and Cefepime dosing.  Plan: Vancomycin 1500mg  IV loading dose, then 1250mg  IV every 24 hours.  Goal trough 15-20 mcg/mL. Expected AUC 552 Cefepime 2gm IV q12h F/U cxs and clinical progress Monitor V/S, labs and levels as indicated  Height: 5\' 4"  (162.6 cm) Weight: 72 kg (158 lb 11.7 oz) IBW/kg (Calculated) : 59.2  Temp (24hrs), Avg:100 F (37.8 C), Min:97.7 F (36.5 C), Max:102.8 F (39.3 C)  Recent Labs  Lab 07/11/2020 1849 07/05/2020 2127 06/22/20 0610  WBC 5.3  --  11.4*  CREATININE 1.17  --  1.53*  LATICACIDVEN 1.1 1.8  --     Estimated Creatinine Clearance: 47.3 mL/min (A) (by C-G formula based on SCr of 1.53 mg/dL (H)).    Allergies  Allergen Reactions  . Codeine Hives  . Lasix [Furosemide] Hives  . Latex Other (See Comments)  . Penicillins Dermatitis    Antimicrobials this admission: Vancomycin 4/5 >>  Cefepime 4/5 >>   Microbiology results: 4/5 BCx: pending 4/5 UCx: pending  MRSA PCR:  Thank you for allowing pharmacy to be a part of this patient's care.  Isac Sarna, BS Pharm D, [place] Clinical Pharmacist Pager [phone] 06/22/2020 8:07 AM

## 2020-06-22 NOTE — ED Notes (Signed)
pH, Arterial 7.159Low Panic     pCO2 arterial >120High Panic  mmHg   pO2, Arterial <31.0Low Panic     CRITICAL VALUES REPORTED TO EDP [place]  Resp to adjust vent settings

## 2020-06-22 NOTE — Progress Notes (Signed)
Initial Nutrition Assessment  DOCUMENTATION CODES:   Not applicable  INTERVENTION:   Initiate Vital AF 1.2 @ 60 ml/hr via OGT (1440 ml/ day)  Tube feeding regimen provides 1728 kcal (82% of needs), 108 grams of protein, and 1168 ml of H2O.   TF + propofol provides 2050 kcals (98% of estimated needs).  NUTRITION DIAGNOSIS:   Inadequate oral intake related to inability to eat as evidenced by NPO status.  GOAL:   Patient will meet greater than or equal to 90% of their needs  MONITOR:   Vent status,Labs,Weight trends,TF tolerance,Skin,I & O's  REASON FOR ASSESSMENT:   Ventilator,Consult Enteral/tube feeding initiation and management  ASSESSMENT:   Jay Skinner is a 60 y.o. male with medical history significant for completely repaired tetralogy of Fallot , persistent atrial fibrillation , diabetes mellitus, GI bleed, hypertension, chronic respiratory failure on 2 L, paralyzed right diaphragm.  Pt admitted with acute on chronic hypoxic and hypercapnic respiratory failure secondary to rt pleural effusion.   Patient is currently intubated on ventilator support. OGT in place per x-ray.  MV: 11.4 L/min Temp (24hrs), Avg:100 F (37.8 C), Min:97.7 F (36.5 C), Max:102.8 F (39.3 C)  Propofol: 12.2 ml/hr (provides 322 kcals daily)  Reviewed I/O's: -250 ml x 24 hours   UOP: 250 ml x 24 hours  MAP: 76  No family at bedside to provide additional history. Per H&P, with recent hospitalizations last month at [facility] and [facility] for GIB.   Case discussed with MD, plan to start TF today.   Medications reviewed and include colace, miralax, 0.9% sodium chloride infusion @ 100 ml/hr, and levophed.   Labs reviewed: CBGS: 116 (inpatient orders for glycemic control are 0-9 units insulin aspart every 4 hours).   NUTRITION - FOCUSED PHYSICAL EXAM:  Flowsheet Row Most Recent Value  Orbital Region No depletion  Upper Arm Region No depletion  Thoracic and Lumbar Region No depletion   Buccal Region No depletion  Temple Region No depletion  Clavicle Bone Region No depletion  Clavicle and Acromion Bone Region No depletion  Scapular Bone Region No depletion  Dorsal Hand Mild depletion  Patellar Region Mild depletion  Anterior Thigh Region Mild depletion  Posterior Calf Region Moderate depletion  Edema (RD Assessment) None  Hair Reviewed  Eyes Reviewed  Mouth Reviewed  Skin Reviewed  Nails Reviewed       Diet Order:   Diet Order            Diet NPO time specified  Diet effective now                 EDUCATION NEEDS:   Not appropriate for education at this time  Skin:  Skin Assessment: Reviewed RN Assessment  Last BM:  Unknown  Height:   Ht Readings from Last 1 Encounters:  06/22/20 5\' 4"  (1.626 m)    Weight:   Wt Readings from Last 1 Encounters:  06/22/20 72 kg    Ideal Body Weight:  59.1 kg  BMI:  Body mass index is 27.25 kg/m.  Estimated Nutritional Needs:   Kcal:  2097  Protein:  95-110 grams  Fluid:  > 2 L    Loistine Chance, RD, LDN, [place] Registered Dietitian II Certified Diabetes Care and Education Specialist Please refer to [facility] for RD and/or RD on-call/weekend/after hours pager

## 2020-06-23 ENCOUNTER — Inpatient Hospital Stay (HOSPITAL_COMMUNITY): Payer: Medicare Other

## 2020-06-23 ENCOUNTER — Inpatient Hospital Stay: Payer: Self-pay

## 2020-06-23 DIAGNOSIS — Z66 Do not resuscitate: Secondary | ICD-10-CM | POA: Diagnosis not present

## 2020-06-23 DIAGNOSIS — E874 Mixed disorder of acid-base balance: Secondary | ICD-10-CM

## 2020-06-23 DIAGNOSIS — J9602 Acute respiratory failure with hypercapnia: Secondary | ICD-10-CM

## 2020-06-23 DIAGNOSIS — Z515 Encounter for palliative care: Secondary | ICD-10-CM

## 2020-06-23 DIAGNOSIS — R4182 Altered mental status, unspecified: Secondary | ICD-10-CM

## 2020-06-23 DIAGNOSIS — R579 Shock, unspecified: Secondary | ICD-10-CM

## 2020-06-23 DIAGNOSIS — Z7189 Other specified counseling: Secondary | ICD-10-CM | POA: Diagnosis not present

## 2020-06-23 DIAGNOSIS — J9601 Acute respiratory failure with hypoxia: Secondary | ICD-10-CM | POA: Diagnosis not present

## 2020-06-23 LAB — PHOSPHORUS
Phosphorus: 2.9 mg/dL (ref 2.5–4.6)
Phosphorus: 2.4 mg/dL — ABNORMAL LOW (ref 2.5–4.6)

## 2020-06-23 LAB — GLUCOSE, CAPILLARY
Glucose-Capillary: 133 mg/dL — ABNORMAL HIGH (ref 70–99)
Glucose-Capillary: 142 mg/dL — ABNORMAL HIGH (ref 70–99)
Glucose-Capillary: 143 mg/dL — ABNORMAL HIGH (ref 70–99)
Glucose-Capillary: 144 mg/dL — ABNORMAL HIGH (ref 70–99)
Glucose-Capillary: 145 mg/dL — ABNORMAL HIGH (ref 70–99)
Glucose-Capillary: 152 mg/dL — ABNORMAL HIGH (ref 70–99)
Glucose-Capillary: 165 mg/dL — ABNORMAL HIGH (ref 70–99)

## 2020-06-23 LAB — COMPREHENSIVE METABOLIC PANEL
ALT: 9 U/L (ref 0–44)
AST: 19 U/L (ref 15–41)
Albumin: 2.7 g/dL — ABNORMAL LOW (ref 3.5–5.0)
Alkaline Phosphatase: 50 U/L (ref 38–126)
Anion gap: 11 (ref 5–15)
BUN: 28 mg/dL — ABNORMAL HIGH (ref 6–20)
CO2: 38 mmol/L — ABNORMAL HIGH (ref 22–32)
Calcium: 7.2 mg/dL — ABNORMAL LOW (ref 8.9–10.3)
Chloride: 89 mmol/L — ABNORMAL LOW (ref 98–111)
Creatinine, Ser: 1.1 mg/dL (ref 0.61–1.24)
GFR, Estimated: >60 mL/min (ref >60)
Glucose, Bld: 173 mg/dL — ABNORMAL HIGH (ref 70–99)
Potassium: 3 mmol/L — ABNORMAL LOW (ref 3.5–5.1)
Sodium: 138 mmol/L (ref 135–145)
Total Bilirubin: 1.3 mg/dL — ABNORMAL HIGH (ref 0.3–1.2)
Total Protein: 5.9 g/dL — ABNORMAL LOW (ref 6.5–8.1)

## 2020-06-23 LAB — CBC WITH DIFFERENTIAL/PLATELET
Abs Immature Granulocytes: 0.08 10*3/uL — ABNORMAL HIGH (ref 0.00–0.07)
Basophils Absolute: 0.1 10*3/uL (ref 0.0–0.1)
Basophils Relative: 1 %
Eosinophils Absolute: 0.2 10*3/uL (ref 0.0–0.5)
Eosinophils Relative: 2 %
HCT: 33.3 % — ABNORMAL LOW (ref 39.0–52.0)
Hemoglobin: 10.5 g/dL — ABNORMAL LOW (ref 13.0–17.0)
Immature Granulocytes: 1 %
Lymphocytes Relative: 6 %
Lymphs Abs: 0.7 10*3/uL (ref 0.7–4.0)
MCH: 33.5 pg (ref 26.0–34.0)
MCHC: 31.5 g/dL (ref 30.0–36.0)
MCV: 106.4 fL — ABNORMAL HIGH (ref 80.0–100.0)
Monocytes Absolute: 1.4 10*3/uL — ABNORMAL HIGH (ref 0.1–1.0)
Monocytes Relative: 11 %
Neutro Abs: 9.6 10*3/uL — ABNORMAL HIGH (ref 1.7–7.7)
Neutrophils Relative %: 79 %
Platelets: 130 10*3/uL — ABNORMAL LOW (ref 150–400)
RBC: 3.13 MIL/uL — ABNORMAL LOW (ref 4.22–5.81)
RDW: 14.5 % (ref 11.5–15.5)
WBC: 12 10*3/uL — ABNORMAL HIGH (ref 4.0–10.5)
nRBC: 0 % (ref 0.0–0.2)

## 2020-06-23 LAB — MAGNESIUM: Magnesium: 1.6 mg/dL — ABNORMAL LOW (ref 1.7–2.4)

## 2020-06-23 LAB — PROCALCITONIN: Procalcitonin: 0.66 ng/mL

## 2020-06-23 MED ORDER — POTASSIUM CHLORIDE 20 MEQ PO PACK
40.0000 meq | PACK | Freq: Two times a day (BID) | ORAL | Status: AC
  Administered 2020-06-23: 40 meq
  Filled 2020-06-23: qty 2

## 2020-06-23 MED ORDER — SODIUM CHLORIDE 0.9% FLUSH
10.0000 mL | Freq: Two times a day (BID) | INTRAVENOUS | Status: DC
  Administered 2020-06-23 – 2020-06-29 (×13): 10 mL
  Administered 2020-06-30: 20 mL
  Administered 2020-06-30 – 2020-07-01 (×2): 10 mL

## 2020-06-23 MED ORDER — ENOXAPARIN SODIUM 40 MG/0.4ML ~~LOC~~ SOLN
40.0000 mg | SUBCUTANEOUS | Status: DC
  Administered 2020-06-23 – 2020-06-30 (×8): 40 mg via SUBCUTANEOUS
  Filled 2020-06-23 (×8): qty 0.4

## 2020-06-23 MED ORDER — SODIUM CHLORIDE 0.9% FLUSH: 10.0000 mL | INTRAVENOUS | Status: DC | PRN

## 2020-06-23 MED ORDER — POTASSIUM CHLORIDE 20 MEQ PO PACK
40.0000 meq | PACK | Freq: Two times a day (BID) | ORAL | Status: DC
  Administered 2020-06-23: 40 meq via ORAL
  Filled 2020-06-23: qty 2

## 2020-06-23 MED ORDER — MAGNESIUM SULFATE 2 GM/50ML IV SOLN
2.0000 g | Freq: Once | INTRAVENOUS | Status: AC
  Administered 2020-06-23: 2 g via INTRAVENOUS
  Filled 2020-06-23: qty 50

## 2020-06-23 MED ORDER — POTASSIUM CHLORIDE 10 MEQ/100ML IV SOLN
10.0000 meq | INTRAVENOUS | Status: AC
  Administered 2020-06-23 (×4): 10 meq via INTRAVENOUS
  Filled 2020-06-23 (×4): qty 100

## 2020-06-23 MED ORDER — SODIUM CHLORIDE 0.9 % IV SOLN
2.0000 g | Freq: Three times a day (TID) | INTRAVENOUS | Status: DC
  Administered 2020-06-23 – 2020-06-27 (×13): 2 g via INTRAVENOUS
  Filled 2020-06-23 (×13): qty 2

## 2020-06-23 NOTE — Progress Notes (Addendum)
PROGRESS NOTE   Jay Skinner  ZHG:[id number] DOB: [birth year] DOA: 06/18/2020 PCP: Sharilyn Sites, MD   Chief Complaint  Patient presents with  . Shortness of Breath   Level of care: ICU  Brief Admission History:  60 y.o. male with medical history significant for completely repaired tetralogy of Fallot , persistent atrial fibrillation , diabetes mellitus, GI bleed, hypertension, chronic respiratory failure on 2 L, paralyzed right diaphragm. History is obtained from chart review as at the time of evaluation patient is intubated.  Pt was brought to the ED via EMS.  Patient was recently discharged from [facility] after admission 3/24-3/27 for GI bleed.  Since discharge to home, patient has steadily declined.  Patient's brother found patient sitting in his chair, sleeping and drooling.  At baseline he is alert and conversant.  Family also reported that patient has had a cough over the past several days.  He was admitted with circulatory shock likely sepsis, pneumonia, acute respiratory failure.    Assessment & Plan:   Principal Problem:   Acute respiratory failure with hypoxia and hypercapnia (HCC) Active Problems:   Gastrointestinal hemorrhage associated with gastric ulcer   HTN (hypertension)   Persistent atrial fibrillation (HCC)   Tetralogy of Fallot   Type 2 diabetes mellitus (HCC)   Shock circulatory (HCC)   Metabolic alkalosis with respiratory acidosis   Severe sepsis with septic shock secondary to community-acquired pneumonia --Patient met sepsis criteria with fevers, tachypnea, leukocytosis and acute hypoxic respiratory failure with persistent hypotension as well -Sepsis pathophysiology persist with persistent hypotension and tachypnea and hypoxia requiring intubation, ventilation continuous IV Levophed for pressure support -Was able to wean down Levophed to 6 mics -Elevated PCT noted -Leukocytosis improving -Continue IV cefepime and IV vancomycin pending further culture  and clinical data  Acute respiratory failure with hypoxia and hypercapnia -Remains intubated and sedated -PCCM consult noted -Discussed with Dr. Melvyn Novas   Right pleural Effusion -Right-sided ultrasound does not demonstrate a pocket that is big enough to allow for safe thoracentesis  Persistent atrial fibrillation  - resume home amiodarone thru G tube - his apixaban had been on hold temporarily for recent GI bleeding  Chronic diastolic right sided heart failure - Hold bumex and spironolactone temporarily due to sepsis pathophysiology and persistent hypotension requiring IV pressors - Follow I/O, weights, electrolytes  History of recent GI bleeding - IV protonix 40 mg BID for GI protection ordered  Type 2 DM  - had been diet controlled - ICU SSI protocol Q4h  Repaired Tetralogy of Fallot - Followed by Dr. Alycia Rossetti at [facility]  FEN--continue tube feeding via OG tube  Hypothyroidism--continue levothyroxine  Social/Ethics--palliative care consult appreciated patient is a DNR, with full scope of treatment  -Prophylaxis-Protonix for GI prophylaxis Eliquis and SCDs for DVT prophylaxis  CRITICAL CARE Performed by: Roxan Hockey   Total critical care time: 46 minutes  Critical care time was exclusive of separately billable procedures and treating other patients.  Critical care was necessary to treat or prevent imminent or life-threatening deterioration. -Remains critically ill, on IV Levophed, intubated and sedated Vent Settings:- PRVC/40%/5/24/470  Critical care was time spent personally by me on the following activities: development of treatment plan with patient and/or surrogate as well as nursing, discussions with consultants, evaluation of patient's response to treatment, examination of patient, obtaining history from patient or surrogate, ordering and performing treatments and interventions, ordering and review of laboratory studies, ordering and review of radiographic studies,  pulse oximetry and re-evaluation of patient's condition.  DVT prophylaxis: SCD/Lovenox prophylactic dose Code Status: Full  Family Communication:  Disposition: TBD Status is: Inpatient  Remains inpatient appropriate because:-Remains critically ill, on IV Levophed, intubated and sedated   Dispo: The patient is from: Home              Anticipated d/c is to: TBD              Patient currently is not medically stable to d/c.   Difficult to place patient No  Consultants:   PCCM critical care  Procedures:     Antimicrobials:  Cefepime 4/5>> Vancomycin 4/5>>   Subjective: -Remains critically ill, on IV Levophed, intubated and sedated Vent Settings:- PRVC/40%/5/24/470  -2 brothers at bedside, questions answered  Objective: Vitals:   06/23/20 1400 06/23/20 1500 06/23/20 1502 06/23/20 1700  BP: (!) 105/58 128/87    Pulse: 86 91    Resp: 14 14    Temp:    97.6 F (36.4 C)  TempSrc:    Axillary  SpO2: 98% 97% 93%   Weight:      Height:        Intake/Output Summary (Last 24 hours) at 06/23/2020 1738 Last data filed at 06/23/2020 1701 Gross per 24 hour  Intake 5669.07 ml  Output 3675 ml  Net 1994.07 ml   Filed Weights   07/02/2020 1838 06/22/20 0100 06/23/20 0441  Weight: 68 kg 72 kg 76.5 kg    Examination:  General exam: Pt sedated, intubated on vent,   Respiratory system: rales LLL. Intubated on vent. Cardiovascular system: normal S1 & S2 heard. No JVD, murmurs, rubs, gallops or clicks. trace pedal edema. Gastrointestinal system: Abdomen is nondistended, soft . Normal bowel sounds heard. Central nervous system: sedated on vent. No gross focal neurological deficits. Extremities: no cyanosis seen. Skin: No gross lesions seen.  Psychiatry: unable to assess.   Data Reviewed: I have personally reviewed following labs and imaging studies  CBC: Recent Labs  Lab 06/18/2020 1849 06/22/20 0610 06/23/20 0657  WBC 5.3 11.4* 12.0*  NEUTROABS 4.1  --  9.6*  HGB 14.3  11.8* 10.5*  HCT 42.8 37.1* 33.3*  MCV 112.3* 111.1* 106.4*  PLT 152 144* 130*    Basic Metabolic Panel: Recent Labs  Lab 06/28/2020 1849 06/22/20 0610 06/22/20 1955 06/23/20 0657  NA 138 141  --  138  K 4.6 4.0  --  3.0*  CL 77* 79*  --  89*  CO2 47* 43*  --  38*  GLUCOSE 203* 104*  --  173*  BUN 22* 28*  --  28*  CREATININE 1.17 1.53*  --  1.10  CALCIUM 9.3 8.7*  --  7.2*  MG  --   --   --  1.6*  PHOS  --   --  2.0* 2.9    GFR: Estimated Creatinine Clearance: 67.6 mL/min (by C-G formula based on SCr of 1.1 mg/dL).  Liver Function Tests: Recent Labs  Lab 07/12/2020 1849 06/23/20 0657  AST 22 19  ALT 14 9  ALKPHOS 69 50  BILITOT 1.0 1.3*  PROT 8.6* 5.9*  ALBUMIN 4.3 2.7*    CBG: Recent Labs  Lab 06/23/20 0040 06/23/20 0422 06/23/20 0804 06/23/20 1124 06/23/20 1652  GLUCAP 152* 133* 165* 145* 144*    Recent Results (from the past 240 hour(s))  Resp Panel by RT-PCR (Flu A&B, Covid) Nasopharyngeal Swab     Status: None   Collection Time: 07/12/2020  7:04 PM   Specimen: Nasopharyngeal Swab; Nasopharyngeal(NP) swabs in  vial transport medium  Result Value Ref Range Status   SARS Coronavirus 2 by RT PCR NEGATIVE NEGATIVE Final    Comment: (NOTE) SARS-CoV-2 target nucleic acids are NOT DETECTED.  The SARS-CoV-2 RNA is generally detectable in upper respiratory specimens during the acute phase of infection. The lowest concentration of SARS-CoV-2 viral copies this assay can detect is 138 copies/mL. A negative result does not preclude SARS-Cov-2 infection and should not be used as the sole basis for treatment or other patient management decisions. A negative result may occur with  improper specimen collection/handling, submission of specimen other than nasopharyngeal swab, presence of viral mutation(s) within the areas targeted by this assay, and inadequate number of viral copies(<138 copies/mL). A negative result must be combined with clinical observations,  patient history, and epidemiological information. The expected result is Negative.  Fact Sheet for Patients:  BloggerCourse.com  Fact Sheet for Healthcare Providers:  SeriousBroker.it  This test is no t yet approved or cleared by the [place] FDA and  has been authorized for detection and/or diagnosis of SARS-CoV-2 by FDA under an Emergency Use Authorization (EUA). This EUA will remain  in effect (meaning this test can be used) for the duration of the COVID-19 declaration under Section 564(b)(1) of the Act, 21 U.S.C.section 360bbb-3(b)(1), unless the authorization is terminated  or revoked sooner.       Influenza A by PCR NEGATIVE NEGATIVE Final   Influenza B by PCR NEGATIVE NEGATIVE Final    Comment: (NOTE) The Xpert Xpress SARS-CoV-2/FLU/RSV plus assay is intended as an aid in the diagnosis of influenza from Nasopharyngeal swab specimens and should not be used as a sole basis for treatment. Nasal washings and aspirates are unacceptable for Xpert Xpress SARS-CoV-2/FLU/RSV testing.  Fact Sheet for Patients: BloggerCourse.com  Fact Sheet for Healthcare Providers: SeriousBroker.it  This test is not yet approved or cleared by the [place] FDA and has been authorized for detection and/or diagnosis of SARS-CoV-2 by FDA under an Emergency Use Authorization (EUA). This EUA will remain in effect (meaning this test can be used) for the duration of the COVID-19 declaration under Section 564(b)(1) of the Act, 21 U.S.C. section 360bbb-3(b)(1), unless the authorization is terminated or revoked.  Performed at [facility], [address] [place], [place] [address]   Culture, blood (single)     Status: None (Preliminary result)   Collection Time: 06/26/2020  8:28 PM   Specimen: BLOOD RIGHT HAND  Result Value Ref Range Status   Specimen Description BLOOD RIGHT HAND  Final    Special Requests   Final    BOTTLES DRAWN AEROBIC AND ANAEROBIC Blood Culture adequate volume   Culture   Final    NO GROWTH 2 DAYS Performed at [facility], [address] [place], [place] [address]    Report Status PENDING  Incomplete  MRSA PCR Screening     Status: None   Collection Time: 06/22/20 12:29 AM   Specimen: Nasal Mucosa; Nasopharyngeal  Result Value Ref Range Status   MRSA by PCR NEGATIVE NEGATIVE Final    Comment:        The GeneXpert MRSA Assay (FDA approved for NASAL specimens only), is one component of a comprehensive MRSA colonization surveillance program. It is not intended to diagnose MRSA infection nor to guide or monitor treatment for MRSA infections. Performed at [facility], [address] [place], [place] [address]   Culture, blood (routine x 2)     Status: None (Preliminary result)   Collection Time:  06/22/20  6:00 AM   Specimen: Left Antecubital; Blood  Result Value Ref Range Status   Specimen Description LEFT ANTECUBITAL  Final   Special Requests   Final    BOTTLES DRAWN AEROBIC AND ANAEROBIC Blood Culture adequate volume   Culture   Final    NO GROWTH 1 DAY Performed at [facility], [address], [place], [place] [address]    Report Status PENDING  Incomplete  Culture, blood (routine x 2)     Status: None (Preliminary result)   Collection Time: 06/22/20  6:09 AM   Specimen: BLOOD LEFT HAND  Result Value Ref Range Status   Specimen Description BLOOD LEFT HAND  Final   Special Requests   Final    BOTTLES DRAWN AEROBIC AND ANAEROBIC Blood Culture adequate volume   Culture   Final    NO GROWTH 1 DAY Performed at [facility], [address] [place], [place] [address]    Report Status PENDING  Incomplete  Culture, Respiratory w Gram Stain     Status: None (Preliminary result)   Collection Time: 06/22/20  8:42 AM   Specimen: Tracheal Aspirate; Respiratory  Result Value Ref Range Status   Specimen Description   Final    TRACHEAL  ASPIRATE Performed at [facility], [address] [place], [place] [address]    Special Requests   Final    Normal Performed at [facility], [address], [place], [place] [address]    Gram Stain   Final    RARE WBC PRESENT,BOTH PMN AND MONONUCLEAR FEW GRAM POSITIVE RODS FEW GRAM POSITIVE COCCI IN PAIRS IN CLUSTERS RARE GRAM NEGATIVE RODS    Culture   Final    TOO YOUNG TO READ Performed at [facility], [place] [address], [place], [place] [address]    Report Status PENDING  Incomplete     Radiology Studies: CT Head Wo Contrast  Result Date: 06/22/2020 CLINICAL DATA:  Shortness of breath and weakness. Mental status changes with unknown cause. EXAM: CT HEAD WITHOUT CONTRAST TECHNIQUE: Contiguous axial images were obtained from the base of the skull through the vertex without intravenous contrast. COMPARISON:  None. FINDINGS: Brain: No evidence of acute infarction, hemorrhage, hydrocephalus, extra-axial collection or mass lesion/mass effect. Vascular: No hyperdense vessel or unexpected calcification. Skull: The calvarium appears intact. Sinuses/Orbits: Paranasal sinuses and mastoid air cells are clear. 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 IMPRESSION: No acute intracranial abnormalities. Electronically Signed   By: Lucienne Capers M.D.   On: 06/26/2020 22:33   [place] CHEST (PLEURAL EFFUSION)  Result Date: 06/23/2020 CLINICAL DATA:  Pleural effusion.  Dyspnea. EXAM: CHEST ULTRASOUND COMPARISON:  June 22, 2020. FINDINGS: Sonographic evaluation of the right chest demonstrates small right pleural effusion. No adequate fluid pocket for thoracentesis is noted. IMPRESSION: Small right pleural effusion is noted as described above. Electronically Signed   By: Marijo Conception M.D.   On: 06/23/2020 09:42   Portable Chest xray  Result Date: 06/22/2020 CLINICAL DATA:  60 year old male intubated . GI hemorrhage. EXAM: PORTABLE CHEST 1 VIEW COMPARISON:  Portable chest 07/04/2020 and  earlier. FINDINGS: Portable AP semi upright view at 0506 hours. Enteric tube courses to the abdomen with side hole at the level of the gastric body. Endotracheal tube tip in satisfactory position between the clavicles and carina. Stable cardiomegaly and mediastinal contours. Chronic postoperative changes to the mediastinum. Veiling opacity at the right lung base is stable to mildly increased. No pneumothorax. Pulmonary vascularity  appears stable without overt edema. Negative left lung base. Paucity of bowel gas in the upper abdomen. No acute osseous abnormality identified. IMPRESSION: 1. Endotracheal and enteric tube in satisfactory position. 2. Right pleural effusion suspected and stable to mildly increased since yesterday. Stable cardiomegaly. Electronically Signed   By: Genevie Ann M.D.   On: 06/22/2020 05:33   DG Chest Portable 1 View  Result Date: 07/02/2020 CLINICAL DATA:  60 year old male status post intubation. EXAM: PORTABLE CHEST 1 VIEW COMPARISON:  Earlier radiograph dated 07/13/2020. FINDINGS: Interval placement of an endotracheal tube. The tip of the tube is suboptimally visualized due to overlying median sternotomy wires and cardiac valve replacement. The tip however appears in the region of the carina. Recommend retraction by approximately 3 cm. Enteric tube extends below the diaphragm with tip beyond the inferior margin of the image. Significant interval decrease in size of right pleural effusion and improvement in aeration of the right lung. No pneumothorax. Stable cardiomegaly. Atherosclerotic calcification of the aorta. Degenerative changes of the spine. No acute osseous pathology. IMPRESSION: 1. Interval placement of an endotracheal tube with tip close to the carina. Recommend retraction by approximately 3 cm. 2. Significant interval decrease in size of the right pleural effusion and improvement in aeration of the right lung. Electronically Signed   By: Anner Crete M.D.   On: 07/05/2020 21:29    DG Chest Port 1 View  Result Date: 06/24/2020 CLINICAL DATA:  Shortness of breath and weakness. Patient is on BiPAP. EXAM: PORTABLE CHEST 1 VIEW COMPARISON:  07/02/2013 FINDINGS: Postoperative changes in the mediastinum. There is complete opacification of the right hemithorax. This could represent either or combination of pleural effusion, consolidation, and/or collapse. Perihilar infiltration on the left lung. Heart size is obscured by the right chest process. IMPRESSION: Complete opacification of the right hemithorax. Perihilar infiltration on the left. Electronically Signed   By: Lucienne Capers M.D.   On: 07/06/2020 20:30   EEG adult  Result Date: 06/23/2020 Lora Havens, MD     06/23/2020  8:06 AM Patient Name: TRAVION KE MRN: [id number] Epilepsy Attending: Lora Havens Referring Physician/Provider: Dr Irwin Brakeman Date: 06/22/2020 Duration: 23.12 mins Patient history: 60yo M with ams. EEG to evaluate for seizure Level of alertness: comatose AEDs during EEG study: Propofol Technical aspects: This EEG study was done with scalp electrodes positioned according to the 10-20 International system of electrode placement. Electrical activity was acquired at a sampling rate of $Remov'500Hz'xibSye$  and reviewed with a high frequency filter of $RemoveB'70Hz'rLiuFDSI$  and a low frequency filter of $RemoveB'1Hz'COYpxxdc$ . EEG data were recorded continuously and digitally stored. Description: EEG showed continuous generalized polymorphic sharply contoured 3 to 6 Hz theta-delta slowing. Hyperventilation and photic stimulation were not performed.   ABNORMALITY - Continuous slow, generalized IMPRESSION: This study is suggestive of moderate to severe diffuse encephalopathy, nonspecific etiology. No seizures or epileptiform discharges were seen throughout the recording. Lora Havens   ECHOCARDIOGRAM COMPLETE  Result Date: 06/22/2020    ECHOCARDIOGRAM REPORT   Patient Name:   BRADIE LACOCK [facility] Date of Exam: 06/22/2020 Medical Rec #:  [id number]         Height:       64.0 in Accession #:    [id number]       Weight:       158.7 lb Date of Birth:  05/26/1960        BSA:          1.773 m Patient Age:  60 years         BP:           115/58 mmHg Patient Gender: M                HR:           83 bpm. [facility] Procedure: 2D Echo, Cardiac Doppler and Color Doppler Indications:    Congenital Heart Disease Q24.0  History:        Patient has no prior history of Echocardiogram examinations.                 Arrythmias:Atrial Flutter; Risk Factors:Hypertension and                 Diabetes. Persistent atrial fibrillation, Tetralogy of Fallot,                 History of tricuspid valve annuloplasty, Hx of Pulmonary                 insufficiency and moderate pulmonary stenosis.  Sonographer:    Celesta Gentile RCS Referring Phys: [phone] Heloise Beecham [facility]  Sonographer Comments: Patient on mechanical ventilator during echo exam. IMPRESSIONS  1. 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 Left ventricular ejection fraction, by estimation, is 60 to 65%. The left ventricle has normal function. The left ventricle has no regional wall motion abnormalities. There is mild left ventricular hypertrophy. Left ventricular diastolic parameters are indeterminate.  2. Right ventricular systolic function is low normal. The right ventricular size is not well visualized.  3. The mitral valve is normal in structure. No evidence of mitral valve regurgitation. No evidence of mitral stenosis.  4. The aortic valve was not well visualized. Aortic valve regurgitation is not visualized. No aortic stenosis is present.  5. Prosthetic pulmonary valve is poorly visualized. No significant regurgitation or stenosis. Peak gradient 2.4 m/s. FINDINGS  Left Ventricle: From cardiology notes history of Tetralogy of Fallot with pulmonary valve replacement now  status post percutaneous pulmonary valve replacement 2021, status post tricuspid valve repair. Left ventricular ejection fraction, by estimation, is 60 to 65%. The left ventricle has normal function. The left ventricle has no regional wall motion abnormalities. The left ventricular internal cavity size was normal in size. There is mild left ventricular hypertrophy. Left ventricular diastolic parameters are indeterminate. Right Ventricle: The right ventricular size is not well visualized. Right vetricular wall thickness was not well visualized. Right ventricular systolic function is low normal. Left Atrium: Left atrial size was normal in size. Right Atrium: Right atrial size was not well visualized. Pericardium: There is no evidence of pericardial effusion. Mitral Valve: The mitral valve is normal in structure. No evidence of mitral valve regurgitation. No evidence of mitral valve stenosis. Tricuspid Valve: Tricuspid anular is present. The tricuspid valve is not well visualized. Tricuspid valve regurgitation is mild . No evidence of tricuspid stenosis. Aortic Valve: The aortic valve was not well visualized. Aortic valve regurgitation is not visualized. No aortic stenosis is present. Aortic valve mean gradient measures 6.4 mmHg. Aortic valve peak gradient measures 12.8 mmHg. Aortic valve area, by VTI measures 1.75 cm. Pulmonic Valve: The pulmonic valve was not well visualized. Pulmonic valve regurgitation is not visualized. No evidence of pulmonic stenosis. Aorta: The aortic root is normal in size and structure. Pulmonary Artery: Prosthetic pulmonary valve is poorly visualized. No significant regurgitation  or stenosis. Peak gradient 2.4 m/s. Venous: Patient on ventilatory, IVC evaluation is not reflective of RA pressures. IAS/Shunts: The interatrial septum was not well visualized.  LEFT VENTRICLE PLAX 2D LVIDd:         4.90 cm LVIDs:         3.20 cm LV PW:         1.20 cm LV IVS:        1.20 cm LVOT diam:     2.00 cm  LV SV:         50 LV SV Index:   28 LVOT Area:     3.14 cm  RIGHT VENTRICLE TAPSE (M-mode): 1.5 cm LEFT ATRIUM             Index       RIGHT ATRIUM           Index LA diam:        4.00 cm 2.26 cm/m  RA Area:     22.70 cm LA Vol (A2C):   60.1 ml 33.89 ml/m RA Volume:   76.50 ml  43.14 ml/m LA Vol (A4C):   44.9 ml 25.32 ml/m LA Biplane Vol: 51.7 ml 29.15 ml/m  AORTIC VALVE AV Area (Vmax):    1.77 cm AV Area (Vmean):   1.79 cm AV Area (VTI):     1.75 cm AV Vmax:           179.17 cm/s AV Vmean:          119.229 cm/s AV VTI:            0.285 m AV Peak Grad:      12.8 mmHg AV Mean Grad:      6.4 mmHg LVOT Vmax:         101.00 cm/s LVOT Vmean:        67.800 cm/s LVOT VTI:          0.159 m LVOT/AV VTI ratio: 0.56  AORTA Ao Root diam: 2.80 cm MITRAL VALVE               TRICUSPID VALVE MV Area (PHT): 3.82 cm    TV Mean grad:   1.8 mmHg MV Decel Time: 199 msec    TR Peak grad:   40.4 mmHg MV E velocity: 99.80 cm/s  TR Vmax:        318.00 cm/s MV A velocity: 47.30 cm/s MV E/A ratio:  2.11        SHUNTS                            Systemic VTI:  0.16 m                            Systemic Diam: 2.00 cm Dina Rich MD Electronically signed by Dina Rich MD Signature Date/Time: 06/22/2020/4:00:03 PM    Final    [place] EKG SITE RITE  Result Date: 06/23/2020 If Site Rite image not attached, placement could not be confirmed due to current cardiac rhythm.   Scheduled Meds: . amiodarone  400 mg Per Tube Daily  . chlorhexidine gluconate (MEDLINE KIT)  15 mL Mouth Rinse BID  . Chlorhexidine Gluconate Cloth  6 each Topical Daily  . docusate  100 mg Per Tube BID  . insulin aspart  0-9 Units Subcutaneous Q4H  . levothyroxine  50 mcg Per Tube Daily  . mouth rinse  15  mL Mouth Rinse 10 times per day  . pantoprazole (PROTONIX) IV  40 mg Intravenous Q12H  . polyethylene glycol  17 g Per Tube Daily  . potassium chloride  40 mEq Per Tube BID  . sodium chloride flush  10-40 mL Intracatheter Q12H   Continuous  Infusions: . sodium chloride Stopped (06/23/20 1400)  . sodium chloride 100 mL/hr at 06/23/20 1634  . ceFEPime (MAXIPIME) IV 2 g (06/23/20 1728)  . feeding supplement (VITAL AF 1.2 CAL) 1,000 mL (06/22/20 1214)  . norepinephrine (LEVOPHED) Adult infusion 6 mcg/min (06/23/20 1634)  . propofol (DIPRIVAN) infusion 40 mcg/kg/min (06/23/20 1634)  . vancomycin Stopped (06/23/20 1257)     LOS: 2 days   Roxan Hockey, MD How to contact the [facility] Attending or Consulting provider [place] or covering provider during after hours [place], for this patient?  1. Check the care team in [facility] and look for a) attending/consulting [facility] provider listed and b) the [facility] team listed 2. Log into [email or web address] and use [place]'s universal password to access. If you do not have the password, please contact the hospital operator. 3. Locate the [facility] provider you are looking for under [facility] and page to a number that you can be directly reached. 4. If you still have difficulty reaching the provider, please page the [facility] (Director on Call) for the Hospitalists listed on amion for assistance.  06/23/2020, 5:37 PM

## 2020-06-23 NOTE — Consult Note (Signed)
Consultation Note Date: 06/23/2020   Patient Name: Jay Skinner  DOB: November 22, 1960  MRN: [id number]  Age / Sex: 60 y.o., male  PCP: Sharilyn Sites, MD Referring Physician: Roxan Hockey, MD  Reason for Consultation: Establishing goals of care  HPI/Patient Profile: 60 y.o. male  with past medical history of repaired tetralogy of Fallot, diastolic heart failure, persistent atrial fibrillation on apixaban, hypertension, chronic respiratory failure on 2L, paralyzed right diaphragm, diabetes, GI bleed (admission at [facility] 3/24-3/27 for GIB) admitted on 07/11/2020 with decline with cough, weakness since discharge from [facility] and admitted with sepsis pneumonia requiring mechanical ventilation.   Clinical Assessment and Goals of Care: I met today at [facility] bedside with his brothers Jay Skinner and Jay Skinner. They share with me that there are 6 siblings in total (were 8 but 2 deceased) but they count on Jay [facility]inner to help out most with [facility]. Jay Skinner lives next door to Jay Skinner in their childhood home where Jay Skinner has lived his entire life. Jay Skinner enjoys [facility]. He is able to cook and is fairly independent and capable. He has slowed down in recent years but enjoyed mowing grass and very active. Until 3-4 weeks ago he would get on his treadmill and walk 4 times daily. They explain that Jay [facility] has had many health issues and they have been told many times that he would not live. He spent the majority of the first 5 years of life in [place].   His brothers share with me that they are very hopeful that Jay [facility] will improve and recover. They have had opportunity to speak with doctors and discuss with other family members (one is EMT) and they have decided they want to put in place DNR. They wish to continue ventilator support and aggressive care with hopes of improvement but would consider comfort if [facility] were to decline further and  they would not want him on life support long term as this is not good living.   All questions/concerns addressed. Emotional support provided.   Primary Decision Maker NEXT OF KIN siblings (they look to brother Jay Skinner as spokesperson)     SUMMARY OF RECOMMENDATIONS   - DNR decided - Continue vent and aggressive care with hopes of improvement  Code Status/Advance Care Planning:  DNR   Symptom Management:   Per attending and PCCM.   Palliative Prophylaxis:   Bowel Regimen, Delirium Protocol, Frequent Pain Assessment, Oral Care and Turn Reposition  Additional Recommendations (Limitations, Scope, Preferences):  Full Scope Treatment  Prognosis:   Unable to determine  Discharge Planning: To Be Determined      Primary Diagnoses: Present on Admission: . Acute respiratory failure with hypoxia and hypercapnia (HCC) . Gastrointestinal hemorrhage associated with gastric ulcer . HTN (hypertension) . Persistent atrial fibrillation ([place]) . Shock circulatory ([place]) . Metabolic alkalosis with respiratory acidosis   I have reviewed the medical record, interviewed the patient and family, and examined the patient. The following aspects are pertinent.  Past Medical History:  Diagnosis Date  . Arthritis   . Asthma   .  Heart disease   . History of bleeding ulcers   . Hypertension   . Hypothyroidism    Social History   Socioeconomic History  . Marital status: Single    Spouse name: Not on file  . Number of children: Not on file  . Years of education: Not on file  . Highest education level: Not on file  Occupational History  . Not on file  Tobacco Use  . Smoking status: Never Smoker  . Smokeless tobacco: Never Used  Substance and Sexual Activity  . Alcohol use: No  . Drug use: No  . Sexual activity: Not on file  Other Topics Concern  . Not on file  Social History Narrative  . Not on file   Social Determinants of Health   Financial Resource Strain: Not on file   Food Insecurity: Not on file  Transportation Needs: Not on file  Physical Activity: Not on file  Stress: Not on file  Social Connections: Not on file   Family History  Problem Relation Age of Onset  . Colon cancer Other    Scheduled Meds: . amiodarone  400 mg Per Tube Daily  . chlorhexidine gluconate (MEDLINE KIT)  15 mL Mouth Rinse BID  . Chlorhexidine Gluconate Cloth  6 each Topical Daily  . docusate  100 mg Per Tube BID  . insulin aspart  0-9 Units Subcutaneous Q4H  . levothyroxine  50 mcg Per Tube Daily  . mouth rinse  15 mL Mouth Rinse 10 times per day  . pantoprazole (PROTONIX) IV  40 mg Intravenous Q12H  . polyethylene glycol  17 g Per Tube Daily  . potassium chloride  40 mEq Oral BID   Continuous Infusions: . sodium chloride 100 mL/hr at 06/23/20 0547  . sodium chloride 100 mL/hr at 06/23/20 0600  . ceFEPime (MAXIPIME) IV 2 g (06/23/20 1053)  . feeding supplement (VITAL AF 1.2 CAL) 1,000 mL (06/22/20 1214)  . magnesium sulfate bolus IVPB    . norepinephrine (LEVOPHED) Adult infusion 7 mcg/min (06/23/20 1231)  . potassium chloride    . propofol (DIPRIVAN) infusion 25 mcg/kg/min (06/23/20 1121)  . vancomycin 1,250 mg (06/23/20 1127)   PRN Meds:.acetaminophen **OR** acetaminophen, albuterol, fentaNYL (SUBLIMAZE) injection, fentaNYL (SUBLIMAZE) injection, polyethylene glycol Allergies  Allergen Reactions  . Codeine Hives  . Lasix [Furosemide] Hives  . Latex Other (See Comments)  . Penicillins Dermatitis   Review of Systems  Unable to perform ROS: Intubated    Physical Exam Vitals and nursing note reviewed.  Constitutional:      General: He is not in acute distress.    Appearance: He is ill-appearing.     Interventions: He is intubated.  Cardiovascular:     Rate and Rhythm: Normal rate.  Pulmonary:     Effort: No tachypnea or accessory muscle usage. He is intubated.  Abdominal:     General: Abdomen is flat.  Neurological:     Mental Status: He is alert.      Comments: Follows commands     Vital Signs: BP (!) 102/52   Pulse 79   Temp (!) 96 F (35.6 C) (Axillary)   Resp (!) 22   Ht 5\' 4"  (1.626 m)   Wt 76.5 kg   SpO2 100%   BMI 28.95 kg/m  Pain Scale: CPOT POSS *See Group Information*: 1-Acceptable,Awake and alert Pain Score: 5    SpO2: SpO2: 100 % O2 Device:SpO2: 100 % O2 Flow Rate: .   IO: Intake/output summary:   Intake/Output  Summary (Last 24 hours) at 06/23/2020 1232 Last data filed at 06/23/2020 0600 Gross per 24 hour  Intake 5450.68 ml  Output 1900 ml  Net 3550.68 ml    LBM:   Baseline Weight: Weight: 68 kg Most recent weight: Weight: 76.5 kg     Palliative Assessment/Data:     Time In: 1140 Time Out: 1220 Time Total: 40 min Greater than 50%  of this time was spent counseling and coordinating care related to the above assessment and plan.  Signed by: Vinie Sill, NP Palliative Medicine Team Pager # [phone] (M-F 8a-5p) Team Phone # [phone] (Nights/Weekends)

## 2020-06-23 NOTE — Progress Notes (Signed)
Peripherally Inserted Central Catheter Placement  The IV Nurse has discussed with the patient and/or persons authorized to consent for the patient, the purpose of this procedure and the potential benefits and risks involved with this procedure.  The benefits include less needle sticks, lab draws from the catheter, and the patient may be discharged home with the catheter. Risks include, but not limited to, infection, bleeding, blood clot (thrombus formation), and puncture of an artery; nerve damage and irregular heartbeat and possibility to perform a PICC exchange if needed/ordered by physician.  Alternatives to this procedure were also discussed.  Bard Power PICC patient education guide, fact sheet on infection prevention and patient information card has been provided to patient /or left at bedside.  Consent obtained from brother Sonia Side) via telephone with second verify by Lunette Stands, RN.   PICC Placement Documentation  PICC Triple Lumen 41/96/22 PICC Right Basilic 43 cm 0 cm (Active)  Indication for Insertion or Continuance of Line Vasoactive infusions 06/23/20 1644  Exposed Catheter (cm) 0 cm 06/23/20 1644  Site Assessment Clean;Dry;Intact 06/23/20 1644  Lumen #1 Status Flushed;Saline locked;Blood return noted 06/23/20 1644  Lumen #2 Status Flushed;Saline locked;Blood return noted 06/23/20 1644  Lumen #3 Status Flushed;Saline locked;Blood return noted 06/23/20 1644  Dressing Type Transparent;Securing device 06/23/20 1644  Dressing Status Clean;Dry;Intact 06/23/20 1644  Antimicrobial disc in place? Yes 06/23/20 [place] Not Applicable 29/79/89 2119  Line Care Connections checked and tightened 06/23/20 1644  Dressing Intervention New dressing 06/23/20 1644  Dressing Change Due 06/30/20 06/23/20 [place] Candida Vetter 06/23/2020, 4:45 PM

## 2020-06-23 NOTE — Procedures (Signed)
Patient Name: Jay Skinner  MRN: [id number]  Epilepsy Attending: Lora Havens  Referring Physician/Provider: Dr Irwin Brakeman Date: 06/22/2020  Duration: 23.12 mins  Patient history: 60yo M with ams. EEG to evaluate for seizure  Level of alertness: comatose  AEDs during EEG study: Propofol  Technical aspects: This EEG study was done with scalp electrodes positioned according to the 10-20 International system of electrode placement. Electrical activity was acquired at a sampling rate of 500Hz  and reviewed with a high frequency filter of 70Hz  and a low frequency filter of 1Hz . EEG data were recorded continuously and digitally stored.   Description: EEG showed continuous generalized polymorphic sharply contoured 3 to 6 Hz theta-delta slowing. Hyperventilation and photic stimulation were not performed.     ABNORMALITY - Continuous slow, generalized  IMPRESSION: This study is suggestive of moderate to severe diffuse encephalopathy, nonspecific etiology. No seizures or epileptiform discharges were seen throughout the recording.  Mitchell Epling Barbra Sarks

## 2020-06-23 NOTE — Progress Notes (Addendum)
NAME:  Jay Sk[facility], MRN:  [id number], DOB:  [birth year], LOS: 2 ADMISSION DATE:  07/16/2020, CONSULTATION DATE:  4/5 REFERRING MD:  Aline August CHIEF COMPLAINT:  Acute on chronic hypoxemic/hypercarbic Resp failure  Brief patient profile:    History of Present Illness:   60 y.o. male with medical history significant for completely repaired tetralogy of Fallot , persistent atrial fibrillation , diabetes mellitus, GI bleed, hypertension, chronic respiratory failure on 2 L, paralyzed right diaphragm. History is obtained from chart review as at the time of evaluation patient is intubated.  Patient was brought to the ED via EMS.  Patient was recently discharged from [facility] after admission 3/24-3/27 for GI bleed.  Since discharge to home, patient has steadily declined.  Today patient's brother found patient sitting in his chair, sleeping and drooling.  At baseline he is alert and conversant.  Family also reported that patient has had a cough over the past several days.  Hospitalization here at [facility] 3/17 - 3/18-for acute upper GI bleed and acute gastroenteritis.  EGD was deferred for patient to follow-up with his gastroenterologist at [facility].  Hemoglobin remained stable at 11.8 during admission.  He was advised to stop taking his apixaban until he followed up with GI.  ED Course:  Heart rate 67s to 80s.  resp rate 24-32.  Blood pressure systolic 130-865.  O2 sats 91 to 97% on 6 L.  Venous blood gas showed pH of 7.1, VCO2 greater than 120.  BNP 368.  Lactic acid 1.1.  Initial portable chest x-ray showed-complete opacification of the right hemithorax, perihilar infiltration on the left.  Post intubation shunt chest x-ray shows significant improvement in   aeration of right lung.  Patient was initially tried on BiPAP and subsequently intubated. ED provider talked to critical care, okay with admission at [facility].  Pertinent  Medical History  Arthritis Asthma Heart disease sp   completely repaired tetralogy of Fallot History of bleeding ulcers Hypertension Hypothyroidism  Significant Hospital Events: Including procedures, antibiotic start and stop dates in addition to other pertinent events   . Resp viral panel 4/4 > neg covid/ flu  . BC x 1  4/4 >>> . ET 4/4 >> . BC x 2 4/5 >>> . MRSA PCR 4/5>  Neg . Resp culture/et 4/5 >>>  Mixed orgs >>> . Maxepime 4/6 >>> . Vanc IV 4/6  >> EEG  4/5 >>> moderate to severe diffuse encephalopathy, nonspecific etiology. No seizures or epileptiform discharges were seen throughout the recording. Marland Kitchen [place] R chest 4/6 > not enough effusion to tap   Scheduled Meds: . amiodarone  400 mg Per Tube Daily  . chlorhexidine gluconate (MEDLINE KIT)  15 mL Mouth Rinse BID  . Chlorhexidine Gluconate Cloth  6 each Topical Daily  . docusate  100 mg Per Tube BID  . insulin aspart  0-9 Units Subcutaneous Q4H  . levothyroxine  50 mcg Per Tube Daily  . mouth rinse  15 mL Mouth Rinse 10 times per day  . pantoprazole (PROTONIX) IV  40 mg Intravenous Q12H  . polyethylene glycol  17 g Per Tube Daily  . potassium chloride  40 mEq Oral BID   Continuous Infusions: . sodium chloride 100 mL/hr at 06/23/20 0547  . sodium chloride 100 mL/hr at 06/23/20 0600  . ceFEPime (MAXIPIME) IV 2 g (06/23/20 1053)  . feeding supplement (VITAL AF 1.2 CAL) 1,000 mL (06/22/20 1214)  . magnesium sulfate bolus IVPB    . norepinephrine (  LEVOPHED) Adult infusion 8 mcg/min (06/23/20 0914)  . potassium chloride    . propofol (DIPRIVAN) infusion 25 mcg/kg/min (06/23/20 1121)  . vancomycin 1,250 mg (06/23/20 1127)   PRN Meds:.acetaminophen **OR** acetaminophen, albuterol, fentaNYL (SUBLIMAZE) injection, fentaNYL (SUBLIMAZE) injection, polyethylene glycol    Interim History / Subjective:  More alert, not breathing over back up rate on proprofol / no more tremors vs chills / no obvius sz  Still on low dose levophed  Despite pos 2350 x 24 h  Objective   Blood pressure  (!) 102/52, pulse 79, temperature (!) 96 F (35.6 C), temperature source Axillary, resp. rate (!) 22, height $RemoveBe'5\' 4"'WMLqGajtZ$  (1.626 m), weight 76.5 kg, SpO2 100 %.    Vent Mode: PRVC FiO2 (%):  [40 %-60 %] 40 % Set Rate:  [14 bmp] 14 bmp Vt Set:  [470 mL] 470 mL PEEP:  [5 cmH20] 5 cmH20 Plateau Pressure:  [25 cmH20-29 cmH20] 28 cmH20   Intake/Output Summary (Last 24 hours) at 06/23/2020 1219 Last data filed at 06/23/2020 0600 Gross per 24 hour  Intake 5450.68 ml  Output 1900 ml  Net 3550.68 ml   Filed Weights   07/16/2020 1838 06/22/20 0100 06/23/20 0441  Weight: 68 kg 72 kg 76.5 kg    Examination: Tmax  100.9 (downward trend)  Pt  sedated on vent / breathing at back up rate  No jvd Oropharynx  ET /og Neck supple Lungs with a  scattered exp > insp rhonchi bilaterally/ secretions /  RRR no s3 or or significant  murmur Abd obese with limited excursion /  tol tf @ 60 cc/h Ext warm with no edema   Neuro sticks tongue out, makes eye contact       Resolved Hospital Problem list      Assessment & Plan:  1) Acute on chronic hypercabic and hypoxemic Resp failure with post hypercapneic met alkalosis >>>  Well compensated on vent, no air trapping  - poor tidal volumes on PS wean this am but was still on propafol at the time  2) Prob HCAP vs asp pna on R in pt with chronically elevated RHD ? Etiology  >>> cultured/ abx  s change   3) Fever with chills vs tremors s evidence sz or serotonin syndrome.  >> resolved on present rx/ check cultures w/a   4) Circulatory shock >> suspect sepsis but probably relatively dry as well > wean off levophed asap / vol challenge x one liter NS  5) AKI secondary to 4 Lab Results  Component Value Date   CREATININE 1.10 06/23/2020   CREATININE 1.53 (H) 06/22/2020   CREATININE 1.17 06/30/2020   >> vol expanded, improved    Best practice (right click and "Reselect all SmartList Selections" daily)  Diet:  NPO Pain/Anxiety/Delirium protocol (if indicated):  Yes (RASS goal -1) VAP protocol (if indicated): Yes DVT prophylaxis: SCD GI prophylaxis: PPI Glucose control:  SSI No Central venous access:  N/A Arterial line:  N/A Foley:  N/A Mobility:  OOB  PT consulted: N/A Last date of multidisciplinary goals of care discussion per [facility]/ pallitive care > treat the treatable, no escalation planned Code Status:  DNR Disposition: ICU   Labs   CBC: Recent Labs  Lab 06/20/2020 1849 06/22/20 0610 06/23/20 0657  WBC 5.3 11.4* 12.0*  NEUTROABS 4.1  --  9.6*  HGB 14.3 11.8* 10.5*  HCT 42.8 37.1* 33.3*  MCV 112.3* 111.1* 106.4*  PLT 152 144* 130*    Basic Metabolic Panel: Recent Labs  Lab 07/15/2020 1849 06/22/20 0610 06/22/20 1955 06/23/20 0657  NA 138 141  --  138  K 4.6 4.0  --  3.0*  CL 77* 79*  --  89*  CO2 47* 43*  --  38*  GLUCOSE 203* 104*  --  173*  BUN 22* 28*  --  28*  CREATININE 1.17 1.53*  --  1.10  CALCIUM 9.3 8.7*  --  7.2*  MG  --   --   --  1.6*  PHOS  --   --  2.0* 2.9   GFR: Estimated Creatinine Clearance: 67.6 mL/min (by C-G formula based on SCr of 1.1 mg/dL). Recent Labs  Lab 07/09/2020 1849 06/27/2020 2127 06/22/20 0610 06/23/20 0657  PROCALCITON  --   --  0.50 0.66  WBC 5.3  --  11.4* 12.0*  LATICACIDVEN 1.1 1.8  --   --     Liver Function Tests: Recent Labs  Lab 06/26/2020 1849 06/23/20 0657  AST 22 19  ALT 14 9  ALKPHOS 69 50  BILITOT 1.0 1.3*  PROT 8.6* 5.9*  ALBUMIN 4.3 2.7*   No results for input(s): LIPASE, AMYLASE in the last 168 hours. No results for input(s): AMMONIA in the last 168 hours.  ABG    Component Value Date/Time   PHART 7.520 (H) 06/22/2020 1337   PCO2ART 57.3 (H) 06/22/2020 1337   PO2ART 169 (H) 06/22/2020 1337   HCO3 44.6 (H) 06/22/2020 1337   ACIDBASEDEF NOT CALCULATED 07/02/2020 2030   O2SAT 99.1 06/22/2020 1337     Coagulation Profile: No results for input(s): INR, PROTIME in the last 168 hours.  Cardiac Enzymes: No results for input(s): CKTOTAL, CKMB,  CKMBINDEX, TROPONINI in the last 168 hours.  HbA1C: No results found for: HGBA1C  CBG: Recent Labs  Lab 06/22/20 2051 06/23/20 0040 06/23/20 0422 06/23/20 0804 06/23/20 1124  GLUCAP 146* 152* 133* 165* 145*       The patient is critically ill with multiple organ systems failure and requires high complexity decision making for assessment and support, frequent evaluation and titration of therapies, application of advanced monitoring technologies and extensive interpretation of multiple databases. Critical Care Time devoted to patient care services described in this note is 34 minutes.    Christinia Gully, MD Pulmonary and [place] [phone]   After 7:00 pm call Elink  [phone]

## 2020-06-24 ENCOUNTER — Inpatient Hospital Stay (HOSPITAL_COMMUNITY): Payer: Medicare Other

## 2020-06-24 DIAGNOSIS — J9601 Acute respiratory failure with hypoxia: Secondary | ICD-10-CM | POA: Diagnosis not present

## 2020-06-24 DIAGNOSIS — J9602 Acute respiratory failure with hypercapnia: Secondary | ICD-10-CM | POA: Diagnosis not present

## 2020-06-24 DIAGNOSIS — I4819 Other persistent atrial fibrillation: Secondary | ICD-10-CM | POA: Diagnosis not present

## 2020-06-24 DIAGNOSIS — E874 Mixed disorder of acid-base balance: Secondary | ICD-10-CM | POA: Diagnosis not present

## 2020-06-24 DIAGNOSIS — R579 Shock, unspecified: Secondary | ICD-10-CM | POA: Diagnosis not present

## 2020-06-24 LAB — CBC WITH DIFFERENTIAL/PLATELET
Abs Immature Granulocytes: 0.07 10*3/uL (ref 0.00–0.07)
Basophils Absolute: 0 10*3/uL (ref 0.0–0.1)
Basophils Relative: 1 %
Eosinophils Absolute: 0.2 10*3/uL (ref 0.0–0.5)
Eosinophils Relative: 2 %
HCT: 33.4 % — ABNORMAL LOW (ref 39.0–52.0)
Hemoglobin: 10.2 g/dL — ABNORMAL LOW (ref 13.0–17.0)
Immature Granulocytes: 1 %
Lymphocytes Relative: 6 %
Lymphs Abs: 0.5 10*3/uL — ABNORMAL LOW (ref 0.7–4.0)
MCH: 33.3 pg (ref 26.0–34.0)
MCHC: 30.5 g/dL (ref 30.0–36.0)
MCV: 109.2 fL — ABNORMAL HIGH (ref 80.0–100.0)
Monocytes Absolute: 0.9 10*3/uL (ref 0.1–1.0)
Monocytes Relative: 11 %
Neutro Abs: 6.6 10*3/uL (ref 1.7–7.7)
Neutrophils Relative %: 79 %
Platelets: 110 10*3/uL — ABNORMAL LOW (ref 150–400)
RBC: 3.06 MIL/uL — ABNORMAL LOW (ref 4.22–5.81)
RDW: 14.7 % (ref 11.5–15.5)
WBC: 8.3 10*3/uL (ref 4.0–10.5)
nRBC: 0 % (ref 0.0–0.2)

## 2020-06-24 LAB — GLUCOSE, CAPILLARY
Glucose-Capillary: 155 mg/dL — ABNORMAL HIGH (ref 70–99)
Glucose-Capillary: 159 mg/dL — ABNORMAL HIGH (ref 70–99)
Glucose-Capillary: 169 mg/dL — ABNORMAL HIGH (ref 70–99)
Glucose-Capillary: 155 mg/dL — ABNORMAL HIGH (ref 70–99)
Glucose-Capillary: 135 mg/dL — ABNORMAL HIGH (ref 70–99)
Glucose-Capillary: 184 mg/dL — ABNORMAL HIGH (ref 70–99)
Glucose-Capillary: 196 mg/dL — ABNORMAL HIGH (ref 70–99)
Glucose-Capillary: 221 mg/dL — ABNORMAL HIGH (ref 70–99)

## 2020-06-24 LAB — COMPREHENSIVE METABOLIC PANEL
ALT: 9 U/L (ref 0–44)
AST: 17 U/L (ref 15–41)
Albumin: 2.7 g/dL — ABNORMAL LOW (ref 3.5–5.0)
Alkaline Phosphatase: 75 U/L (ref 38–126)
Anion gap: 8 (ref 5–15)
BUN: 26 mg/dL — ABNORMAL HIGH (ref 6–20)
CO2: 35 mmol/L — ABNORMAL HIGH (ref 22–32)
Calcium: 7.1 mg/dL — ABNORMAL LOW (ref 8.9–10.3)
Chloride: 97 mmol/L — ABNORMAL LOW (ref 98–111)
Creatinine, Ser: 1.03 mg/dL (ref 0.61–1.24)
GFR, Estimated: >60 mL/min (ref >60)
Glucose, Bld: 154 mg/dL — ABNORMAL HIGH (ref 70–99)
Potassium: 4.6 mmol/L (ref 3.5–5.1)
Sodium: 140 mmol/L (ref 135–145)
Total Bilirubin: 0.8 mg/dL (ref 0.3–1.2)
Total Protein: 6.2 g/dL — ABNORMAL LOW (ref 6.5–8.1)

## 2020-06-24 LAB — PROCALCITONIN: Procalcitonin: 0.49 ng/mL

## 2020-06-24 LAB — MAGNESIUM: Magnesium: 2.1 mg/dL (ref 1.7–2.4)

## 2020-06-24 LAB — SEDIMENTATION RATE: Sed Rate: 107 mm/hr — ABNORMAL HIGH (ref 0–16)

## 2020-06-24 MED ORDER — METHYLPREDNISOLONE SODIUM SUCC 125 MG IJ SOLR
60.0000 mg | Freq: Three times a day (TID) | INTRAMUSCULAR | Status: DC
  Administered 2020-06-24 – 2020-06-25 (×2): 60 mg via INTRAVENOUS
  Filled 2020-06-24 (×2): qty 2

## 2020-06-24 MED ORDER — INSULIN ASPART 100 UNIT/ML ~~LOC~~ SOLN
0.0000 [IU] | Freq: Three times a day (TID) | SUBCUTANEOUS | Status: DC
  Administered 2020-06-25: 7 [IU] via SUBCUTANEOUS
  Administered 2020-06-25 (×2): 11 [IU] via SUBCUTANEOUS
  Administered 2020-06-26: 4 [IU] via SUBCUTANEOUS
  Administered 2020-06-26 (×2): 11 [IU] via SUBCUTANEOUS
  Administered 2020-06-27: 7 [IU] via SUBCUTANEOUS
  Administered 2020-06-27 (×2): 15 [IU] via SUBCUTANEOUS
  Administered 2020-06-28 – 2020-06-29 (×4): 11 [IU] via SUBCUTANEOUS
  Administered 2020-06-29: 4 [IU] via SUBCUTANEOUS
  Administered 2020-06-29: 7 [IU] via SUBCUTANEOUS
  Administered 2020-06-30 (×2): 11 [IU] via SUBCUTANEOUS
  Administered 2020-06-30: 15 [IU] via SUBCUTANEOUS
  Administered 2020-07-01: 7 [IU] via SUBCUTANEOUS

## 2020-06-24 MED ORDER — INSULIN ASPART 100 UNIT/ML ~~LOC~~ SOLN
0.0000 [IU] | Freq: Every day | SUBCUTANEOUS | Status: DC
  Administered 2020-06-25: 2 [IU] via SUBCUTANEOUS
  Administered 2020-06-26 – 2020-06-30 (×5): 3 [IU] via SUBCUTANEOUS

## 2020-06-24 MED ORDER — INSULIN GLARGINE 100 UNIT/ML ~~LOC~~ SOLN
10.0000 [IU] | Freq: Every day | SUBCUTANEOUS | Status: DC
  Administered 2020-06-25 – 2020-06-30 (×6): 10 [IU] via SUBCUTANEOUS
  Filled 2020-06-24 (×9): qty 0.1

## 2020-06-24 NOTE — Progress Notes (Addendum)
NAME:  Jay Skinner, MRN:  [id number], DOB:  [birth year], LOS: 3 ADMISSION DATE:  06/22/2020, CONSULTATION DATE:  4/5 REFERRING MD:  Aline August CHIEF COMPLAINT:  Acute on chronic hypoxemic/hypercarbic Resp failure  Brief patient profile:    History of Present Illness:   60 y.o. male with medical history significant for completely repaired tetralogy of Fallot , persistent atrial fibrillation , diabetes mellitus, GI bleed, hypertension, chronic respiratory failure on 2 L, paralyzed right diaphragm. History is obtained from chart review as at the time of evaluation patient is intubated.  Patient was brought to the ED via EMS.  Patient was recently discharged from [facility] after admission 3/24-3/27 for GI bleed.  Since discharge to home, patient has steadily declined.  Today patient's brother found patient sitting in his chair, sleeping and drooling.  At baseline he is alert and conversant.  Family also reported that patient has had a cough over the past several days.  Hospitalization here at [facility] 3/17 - 3/18-for acute upper GI bleed and acute gastroenteritis.  EGD was deferred for patient to follow-up with his gastroenterologist at [facility].  Hemoglobin remained stable at 11.8 during admission.  He was advised to stop taking his apixaban until he followed up with GI.  ED Course:  Heart rate 67s to 80s.  resp rate 24-32.  Blood pressure systolic 194-174.  O2 sats 91 to 97% on 6 L.  Venous blood gas showed pH of 7.1, VCO2 greater than 120.  BNP 368.  Lactic acid 1.1.  Initial portable chest x-ray showed-complete opacification of the right hemithorax, perihilar infiltration on the left.  Post intubation shunt chest x-ray shows significant improvement in   aeration of right lung.  Patient was initially tried on BiPAP and subsequently intubated. ED provider talked to critical care, okay with admission at [facility].  Pertinent  Medical History  Arthritis Asthma Heart disease sp   completely repaired tetralogy of Fallot History of bleeding ulcers Hypertension Hypothyroidism  Significant Hospital Events: Including procedures, antibiotic start and stop dates in addition to other pertinent events   . Resp viral panel 4/4 > neg covid/ flu  . BC x 1  4/4 >>> . ET 4/4 >> . BC x 2 4/5 >>> . MRSA PCR 4/5>  Neg . Resp culture/et 4/5 >>>  Mixed orgs >>> rare pseudomonaas . Maxepime 4/6 >>> . Vanc IV 4/6  >> EEG  4/5 >>> moderate to severe diffuse encephalopathy, nonspecific etiology. No seizures or epileptiform discharges were seen throughout the recording. Marland Kitchen [place] R chest 4/6 > not enough effusion to tap  . R PIC 4/6 >>>   Scheduled Meds: . amiodarone  400 mg Per Tube Daily  . chlorhexidine gluconate (MEDLINE KIT)  15 mL Mouth Rinse BID  . Chlorhexidine Gluconate Cloth  6 each Topical Daily  . docusate  100 mg Per Tube BID  . enoxaparin (LOVENOX) injection  40 mg Subcutaneous Q24H  . insulin aspart  0-9 Units Subcutaneous Q4H  . levothyroxine  50 mcg Per Tube Daily  . mouth rinse  15 mL Mouth Rinse 10 times per day  . pantoprazole (PROTONIX) IV  40 mg Intravenous Q12H  . polyethylene glycol  17 g Per Tube Daily  . sodium chloride flush  10-40 mL Intracatheter Q12H   Continuous Infusions: . sodium chloride Stopped (06/23/20 1400)  . sodium chloride 100 mL/hr at 06/24/20 1356  . ceFEPime (MAXIPIME) IV Stopped (06/24/20 1021)  . feeding supplement (VITAL AF 1.2  CAL) 1,000 mL (06/22/20 1214)  . norepinephrine (LEVOPHED) Adult infusion Stopped (06/24/20 1325)  . propofol (DIPRIVAN) infusion 15 mcg/kg/min (06/24/20 1356)  . vancomycin Stopped (06/24/20 1203)   PRN Meds:.acetaminophen **OR** acetaminophen, albuterol, fentaNYL (SUBLIMAZE) injection, fentaNYL (SUBLIMAZE) injection, polyethylene glycol, sodium chloride flush    Interim History / Subjective:  Weaning levophed and diprovan in anticipation of attempting PSV today   Objective   Blood pressure 116/61,  pulse 87, temperature 98 F (36.7 C), temperature source Axillary, resp. rate 14, height $RemoveBe'5\' 4"'VZEhJQWAP$  (1.626 m), weight 82.6 kg, SpO2 98 %.    Vent Mode: PRVC FiO2 (%):  [40 %] 40 % Set Rate:  [14 bmp] 14 bmp Vt Set:  [470 mL] 470 mL PEEP:  [5 cmH20] 5 cmH20 Plateau Pressure:  [25 cmH20-30 cmH20] 25 cmH20   Intake/Output Summary (Last 24 hours) at 06/24/2020 1435 Last data filed at 06/24/2020 1356 Gross per 24 hour  Intake 3722.96 ml  Output 2950 ml  Net 772.96 ml   Filed Weights   06/22/20 0100 06/23/20 0441 06/24/20 0500  Weight: 72 kg 76.5 kg 82.6 kg       Examination: Tmax  100.2 Pt  sedated on vent / breathing just above  back up rate  No jvd Oropharynx  ET / og  Neck supple Lungs with a  scattered exp > insp rhonchi bilaterally/ secretions copious / white  RRR no s3 or or significant  murmur Abd obese with limited excursion /  tol tf  Ext warm with no edema   Neuro sedated on dioprovan        I personally reviewed images /mpression as follows:  CXR:   4/7 ? slt better aeration overall    Resolved Hospital Problem list      Assessment & Plan:  1) Acute on chronic hypercabic and hypoxemic Resp failure with post hypercapneic met alkalosis >>>  Well compensated on vent, no air trapping  - poor tidal volumes on PS wean this am but was still on propafol at the time> try again on less propafol and higher PSV   2) Prob HCAP vs asp pna on R in pt with chronically elevated RHD ? Etiology  >>> cultured/ abx  s change  >>> check ESR as on high dose amiodarone   3) Fever with chills vs tremors s evidence sz or serotonin syndrome.  >> resolved on present rx/ check cultures w/a   4) Circulatory shock >> suspect sepsis but probably relatively dry as well > wean off levophed   >>> check cvp   5) AKI secondary to 4 Lab Results  Component Value Date   CREATININE 1.03 06/24/2020   CREATININE 1.10 06/23/2020   CREATININE 1.53 (H) 06/22/2020   >> vol expanded, improved     Best practice (right click and "Reselect all SmartList Selections" daily)  Diet:  Tube Feed  Pain/Anxiety/Delirium protocol (if indicated): Yes (RASS goal -1) VAP protocol (if indicated): Yes DVT prophylaxis: LMWH and SCD GI prophylaxis: PPI Glucose control:  SSI No Central venous access:  Yes, and it is still needed Arterial line:  N/A Foley:  N/A Mobility:  OOB  PT consulted: N/A Last date of multidisciplinary goals of care discussion per [facility]/ pallitive care > treat the treatable, no escalation planned Code Status:  DNR Disposition: ICU   Labs   CBC: Recent Labs  Lab 06/30/2020 1849 06/22/20 0610 06/23/20 0657 06/24/20 0637  WBC 5.3 11.4* 12.0* 8.3  NEUTROABS 4.1  --  9.6* 6.6  HGB 14.3 11.8* 10.5* 10.2*  HCT 42.8 37.1* 33.3* 33.4*  MCV 112.3* 111.1* 106.4* 109.2*  PLT 152 144* 130* 110*    Basic Metabolic Panel: Recent Labs  Lab 06/25/2020 1849 06/22/20 0610 06/22/20 1955 06/23/20 0657 06/23/20 2121 06/24/20 0637  NA 138 141  --  138  --  140  K 4.6 4.0  --  3.0*  --  4.6  CL 77* 79*  --  89*  --  97*  CO2 47* 43*  --  38*  --  35*  GLUCOSE 203* 104*  --  173*  --  154*  BUN 22* 28*  --  28*  --  26*  CREATININE 1.17 1.53*  --  1.10  --  1.03  CALCIUM 9.3 8.7*  --  7.2*  --  7.1*  MG  --   --   --  1.6*  --  2.1  PHOS  --   --  2.0* 2.9 2.4*  --    GFR: Estimated Creatinine Clearance: 74.9 mL/min (by C-G formula based on SCr of 1.03 mg/dL). Recent Labs  Lab 07/04/2020 1849 07/08/2020 2127 06/22/20 0610 06/23/20 0657 06/24/20 0637  PROCALCITON  --   --  0.50 0.66 0.49  WBC 5.3  --  11.4* 12.0* 8.3  LATICACIDVEN 1.1 1.8  --   --   --     Liver Function Tests: Recent Labs  Lab 07/17/2020 1849 06/23/20 0657 06/24/20 0637  AST $Re'22 19 17  'RkM$ ALT $R'14 9 9  'hk$ ALKPHOS 69 50 75  BILITOT 1.0 1.3* 0.8  PROT 8.6* 5.9* 6.2*  ALBUMIN 4.3 2.7* 2.7*   No results for input(s): LIPASE, AMYLASE in the last 168 hours. No results for input(s): AMMONIA in the last  168 hours.  ABG    Component Value Date/Time   PHART 7.520 (H) 06/22/2020 1337   PCO2ART 57.3 (H) 06/22/2020 1337   PO2ART 169 (H) 06/22/2020 1337   HCO3 44.6 (H) 06/22/2020 1337   ACIDBASEDEF NOT CALCULATED 06/18/2020 2030   O2SAT 99.1 06/22/2020 1337     Coagulation Profile: No results for input(s): INR, PROTIME in the last 168 hours.  Cardiac Enzymes: No results for input(s): CKTOTAL, CKMB, CKMBINDEX, TROPONINI in the last 168 hours.  HbA1C: No results found for: HGBA1C  CBG: Recent Labs  Lab 06/23/20 1933 06/23/20 2315 06/24/20 0458 06/24/20 0822 06/24/20 1113  GLUCAP 143* 142* 155* 159* 169*        The patient is critically ill with multiple organ systems failure and requires high complexity decision making for assessment and support, frequent evaluation and titration of therapies, application of advanced monitoring technologies and extensive interpretation of multiple databases. Critical Care Time devoted to patient care services described in this note is 32 minutes.    Christinia Gully, MD Pulmonary and [place] [phone]   After 7:00 pm call Elink  [phone]

## 2020-06-24 NOTE — Progress Notes (Signed)
Nutrition Follow-up  DOCUMENTATION CODES:   Not applicable  INTERVENTION:   Continue Vital AF 1.2 @ 60 ml/hr via OGT (1440 ml/ day)  Tube feeding regimen provides 1728 kcal (95% of needs), 108 grams of protein, and 1168 ml of H2O.   TF + propofol provides 1891 kcals (100% of estimated needs).  NUTRITION DIAGNOSIS:   Inadequate oral intake related to inability to eat as evidenced by NPO status.  Ongoing  GOAL:   Patient will meet greater than or equal to 90% of their needs  Met with TF  MONITOR:   Vent status,Labs,Weight trends,TF tolerance,Skin,I & O's  REASON FOR ASSESSMENT:   Ventilator,Consult Enteral/tube feeding initiation and management  ASSESSMENT:   RAMY GRETH is a 60 y.o. male with medical history significant for completely repaired tetralogy of Fallot , persistent atrial fibrillation , diabetes mellitus, GI bleed, hypertension, chronic respiratory failure on 2 L, paralyzed right diaphragm.  Patient is currently intubated on ventilator support. OGT in place. MV: 6.9 L/min Temp (24hrs), Avg:98.6 F (37 C), Min:96.9 F (36.1 C), Max:100.2 F (37.9 C)  Propofol: 6.1 ml/hr (provides 161 kcals)  Reviewed I/O's: +953 ml x 24 hours and +4.7 L since admission  UOP: 3 L x 24 hours  Pt continues with TF via OGT: Vital AF 1.2 @ 60 ml/hr, regimen provides 1728 kcals, 108 grams protein, and 1168 ml free water daily.   Medications reviewed and include colace, levophed, miralax, and 0.9% sodium chloride infusion @ 100 ml/hr.   Labs reviewed: CBGS: 159-169 (inpatient orders for glycemic control are 0-9 units insulin aspart every 4 hours).   Diet Order:   Diet Order            Diet NPO time specified  Diet effective now                 EDUCATION NEEDS:   Not appropriate for education at this time  Skin:  Skin Assessment: Reviewed RN Assessment  Last BM:  Unknown  Height:   Ht Readings from Last 1 Encounters:  06/22/20 $RemoveB'5\' 4"'xNfWhVTS$  (1.626 m)     Weight:   Wt Readings from Last 1 Encounters:  06/24/20 82.6 kg    Ideal Body Weight:  59.1 kg  BMI:  Body mass index is 31.26 kg/m.  Estimated Nutritional Needs:   Kcal:  1821  Protein:  95-110 grams  Fluid:  > 2 L    Loistine Chance, RD, LDN, [place] Registered Dietitian II Certified Diabetes Care and Education Specialist Please refer to [facility] for RD and/or RD on-call/weekend/after hours pager

## 2020-06-24 NOTE — Progress Notes (Signed)
PROGRESS NOTE   Jay Skinner  KVQ:[id number] DOB: 1960/04/05 DOA: 06/26/2020 PCP: Sharilyn Sites, MD   Chief Complaint  Patient presents with  . Shortness of Breath   Level of care: ICU  Brief Admission History:  60 y.o. male with medical history significant for completely repaired tetralogy of Fallot , persistent atrial fibrillation , diabetes mellitus, GI bleed, hypertension, chronic respiratory failure on 2 L, paralyzed right diaphragm. History is obtained from chart review as at the time of evaluation patient is intubated.  Pt was brought to the ED via EMS.  Patient was recently discharged from [facility] after admission 3/24-3/27 for GI bleed.  Since discharge to home, patient has steadily declined.  Patient's brother found patient sitting in his chair, sleeping and drooling.  At baseline he is alert and conversant.  Family also reported that patient has had a cough over the past several days.  He was admitted with circulatory shock likely sepsis, pneumonia, acute respiratory failure.    Assessment & Plan:   Principal Problem:   Acute respiratory failure with hypoxia and hypercapnia (HCC) Active Problems:   Gastrointestinal hemorrhage associated with gastric ulcer   HTN (hypertension)   Persistent atrial fibrillation (HCC)   Tetralogy of Fallot   Type 2 diabetes mellitus (HCC)   Shock circulatory (HCC)   Metabolic alkalosis with respiratory acidosis   Severe sepsis with septic shock secondary to community-acquired pneumonia --Patient met sepsis criteria with fevers, tachypnea, leukocytosis and acute hypoxic respiratory failure with persistent hypotension as well -Sepsis pathophysiology persist with persistent hypotension and tachypnea and hypoxia requiring intubation, ventilation continuous IV Levophed for pressure support -Patient has been weaned off Levophed as of 06/24/2020 -Elevated PCT noted -Leukocytosis resolved on 06/24/2020 -Continue IV cefepime and IV vancomycin  pending further culture and clinical data  Acute respiratory failure with hypoxia and hypercapnia -Remains intubated and sedated -PCCM consult noted -Discussed with Dr. Melvyn Novas -ESR is 107--- cannot rule out amiodarone induced lung toxicity -We will stop amiodarone and start IV solu-medrol   Right pleural Effusion -Right-sided ultrasound does not demonstrate a pocket that is big enough to allow for safe thoracentesis  Persistent atrial fibrillation  - hold  amiodarone -due to concerns about possible amiodarone toxicity as noted above - his apixaban had been on hold temporarily for recent GI bleeding  Chronic diastolic right sided heart failure - Hold bumex and spironolactone temporarily due to sepsis pathophysiology and persistent hypotension requiring IV pressors - Follow I/O, weights, electrolytes  History of recent GI bleeding - IV protonix 40 mg BID for GI protection ordered  Type 2 DM  -Anticipate worsening glycemic control with initiation of IV steroids for amiodarone toxicity Use Novolog/Humalog Sliding scale insulin with Accu-Cheks/Fingersticks as ordered   Repaired Tetralogy of Fallot - Followed by Dr. Alycia Rossetti at [facility]  FEN--continue tube feeding via OG tube  Hypothyroidism--continue levothyroxine  Social/Ethics--palliative care consult appreciated patient is a DNR, with full scope of treatment  -Prophylaxis-Protonix for GI prophylaxis Eliquis and SCDs for DVT prophylaxis  CRITICAL CARE Performed by: Roxan Hockey   Total critical care time: 42 minutes  Critical care time was exclusive of separately billable procedures and treating other patients.  Critical care was necessary to treat or prevent imminent or life-threatening deterioration. -Remains critically ill, o intubated and sedated Vent Settings:- PRVC/40%/5/14/470 -Off Levophed  Critical care was time spent personally by me on the following activities: development of treatment plan with patient and/or  surrogate as well as nursing, discussions with consultants, evaluation  of patient's response to treatment, examination of patient, obtaining history from patient or surrogate, ordering and performing treatments and interventions, ordering and review of laboratory studies, ordering and review of radiographic studies, pulse oximetry and re-evaluation of patient's condition.   DVT prophylaxis: SCD/Lovenox prophylactic dose Code Status: Full  Family Communication:  Disposition: TBD Status is: Inpatient  Remains inpatient appropriate because:-Remains critically ill, on IV Levophed, intubated and sedated   Dispo: The patient is from: Home              Anticipated d/c is to: TBD              Patient currently is not medically stable to d/c.   Difficult to place patient No  Consultants:   PCCM critical care  Procedures:     Antimicrobials:  Cefepime 4/5>> Vancomycin 4/5>>   Subjective: -Remains critically ill, off IV Levophed, intubated and sedated Vent Settings:- PRVC/40%/5/14/470  - no fevers -BP is improved  Objective: Vitals:   06/24/20 1400 06/24/20 1500 06/24/20 1600 06/24/20 1628  BP: (!) 104/50 120/61 (!) 109/49   Pulse: 86 85 67   Resp: _0 Temp:    (!) 97.5 F (36.4 C)  TempSrc:    Axillary  SpO2: 98% 96% 96%   Weight:      Height:        Intake/Output Summary (Last 24 hours) at 06/24/2020 1759 Last data filed at 06/24/2020 1635 Gross per 24 hour  Intake 3171.12 ml  Output 1725 ml  Net 1446.12 ml   Filed Weights   06/22/20 0100 06/23/20 0441 06/24/20 0500  Weight: 72 kg 76.5 kg 82.6 kg    Examination:  General exam: Pt sedated, intubated on vent,   Respiratory system: Fair bilateral air movement, no wheezing Cardiovascular system: normal S1 & S2 heard. No JVD, murmurs, rubs, gallops or clicks. trace pedal edema. Gastrointestinal system: Abdomen is nondistended, soft . Normal bowel sounds heard. Central nervous system: sedated on vent. No gross  focal neurological deficits. Extremities: no cyanosis seen. Skin: No gross lesions seen.  Psychiatry: unable to assess.   Data Reviewed: I have personally reviewed following labs and imaging studies  CBC: Recent Labs  Lab 06/19/2020 1849 06/22/20 0610 06/23/20 0657 06/24/20 0637  WBC 5.3 11.4* 12.0* 8.3  NEUTROABS 4.1  --  9.6* 6.6  HGB 14.3 11.8* 10.5* 10.2*  HCT 42.8 37.1* 33.3* 33.4*  MCV 112.3* 111.1* 106.4* 109.2*  PLT 152 144* 130* 110*    Basic Metabolic Panel: Recent Labs  Lab 06/29/2020 1849 06/22/20 0610 06/22/20 1955 06/23/20 0657 06/23/20 2121 06/24/20 0637  NA 138 141  --  138  --  140  K 4.6 4.0  --  3.0*  --  4.6  CL 77* 79*  --  89*  --  97*  CO2 47* 43*  --  38*  --  35*  GLUCOSE 203* 104*  --  173*  --  154*  BUN 22* 28*  --  28*  --  26*  CREATININE 1.17 1.53*  --  1.10  --  1.03  CALCIUM 9.3 8.7*  --  7.2*  --  7.1*  MG  --   --   --  1.6*  --  2.1  PHOS  --   --  2.0* 2.9 2.4*  --     GFR: Estimated Creatinine Clearance: 74.9 mL/min (by C-G formula based on SCr of 1.03 mg/dL).  Liver Function Tests: Recent Labs  Lab 07/02/2020 1849 06/23/20 0657 06/24/20 0637  AST _0 ALT _1 ALKPHOS 69 50 75  BILITOT 1.0 1.3* 0.8  PROT 8.6* 5.9* 6.2*  ALBUMIN 4.3 2.7* 2.7*    CBG: Recent Labs  Lab 06/23/20 2315 06/24/20 0458 06/24/20 0822 06/24/20 1113 06/24/20 1617  GLUCAP 142* 155* 159* 169* 155*    Recent Results (from the past 240 hour(s))  Resp Panel by RT-PCR (Flu A&B, Covid) Nasopharyngeal Swab     Status: None   Collection Time: 07/05/2020  7:04 PM   Specimen: Nasopharyngeal Swab; Nasopharyngeal(NP) swabs in vial transport medium  Result Value Ref Range Status   SARS Coronavirus 2 by RT PCR NEGATIVE NEGATIVE Final    Comment: (NOTE) SARS-CoV-2 target nucleic acids are NOT DETECTED.  The SARS-CoV-2 RNA is generally detectable in upper respiratory specimens during the acute phase of infection. The lowest concentration of  SARS-CoV-2 viral copies this assay can detect is 138 copies/mL. A negative result does not preclude SARS-Cov-2 infection and should not be used as the sole basis for treatment or other patient management decisions. A negative result may occur with  improper specimen collection/handling, submission of specimen other than nasopharyngeal swab, presence of viral mutation(s) within the areas targeted by this assay, and inadequate number of viral copies(<138 copies/mL). A negative result must be combined with clinical observations, patient history, and epidemiological information. The expected result is Negative.  Fact Sheet for Patients:  EntrepreneurPulse.com.au  Fact Sheet for Healthcare Providers:  IncredibleEmployment.be  This test is no t yet approved or cleared by the [place] FDA and  has been authorized for detection and/or diagnosis of SARS-CoV-2 by FDA under an Emergency Use Authorization (EUA). This EUA will remain  in effect (meaning this test can be used) for the duration of the COVID-19 declaration under Section 564(b)(1) of the Act, 21 U.S.C.section 360bbb-3(b)(1), unless the authorization is terminated  or revoked sooner.       Influenza A by PCR NEGATIVE NEGATIVE Final   Influenza B by PCR NEGATIVE NEGATIVE Final    Comment: (NOTE) The Xpert Xpress SARS-CoV-2/FLU/RSV plus assay is intended as an aid in the diagnosis of influenza from Nasopharyngeal swab specimens and should not be used as a sole basis for treatment. Nasal washings and aspirates are unacceptable for Xpert Xpress SARS-CoV-2/FLU/RSV testing.  Fact Sheet for Patients: EntrepreneurPulse.com.au  Fact Sheet for Healthcare Providers: IncredibleEmployment.be  This test is not yet approved or cleared by the [place] FDA and has been authorized for detection and/or diagnosis of SARS-CoV-2 by FDA under an Emergency Use  Authorization (EUA). This EUA will remain in effect (meaning this test can be used) for the duration of the COVID-19 declaration under Section 564(b)(1) of the Act, 21 U.S.C. section 360bbb-3(b)(1), unless the authorization is terminated or revoked.  Performed at [facility], [address] [place], [place] [address]   Culture, blood (single)     Status: None (Preliminary result)   Collection Time: 06/24/2020  8:28 PM   Specimen: BLOOD RIGHT HAND  Result Value Ref Range Status   Specimen Description BLOOD RIGHT HAND  Final   Special Requests   Final    BOTTLES DRAWN AEROBIC AND ANAEROBIC Blood Culture adequate volume   Culture   Final    NO GROWTH 2 DAYS Performed at [facility], [address], [place],  [address]    Report Status PENDING  Incomplete  MRSA PCR Screening     Status: None  Collection Time: 06/22/20 12:29 AM   Specimen: Nasal Mucosa; Nasopharyngeal  Result Value Ref Range Status   MRSA by PCR NEGATIVE NEGATIVE Final    Comment:        The GeneXpert MRSA Assay (FDA approved for NASAL specimens only), is one component of a comprehensive MRSA colonization surveillance program. It is not intended to diagnose MRSA infection nor to guide or monitor treatment for MRSA infections. Performed at [facility], [address], [place], [place] [address]   Culture, blood (routine x 2)     Status: None (Preliminary result)   Collection Time: 06/22/20  6:00 AM   Specimen: Left Antecubital; Blood  Result Value Ref Range Status   Specimen Description LEFT ANTECUBITAL  Final   Special Requests   Final    BOTTLES DRAWN AEROBIC AND ANAEROBIC Blood Culture adequate volume   Culture   Final    NO GROWTH 1 DAY Performed at [facility], [address], [place], [place] [address]    Report Status PENDING  Incomplete  Culture, blood (routine x 2)     Status: None (Preliminary result)   Collection Time: 06/22/20  6:09 AM   Specimen: BLOOD LEFT HAND  Result Value Ref  Range Status   Specimen Description BLOOD LEFT HAND  Final   Special Requests   Final    BOTTLES DRAWN AEROBIC AND ANAEROBIC Blood Culture adequate volume   Culture   Final    NO GROWTH 1 DAY Performed at [facility], [address], [place], [place] [address]    Report Status PENDING  Incomplete  Culture, Respiratory w Gram Stain     Status: None (Preliminary result)   Collection Time: 06/22/20  8:42 AM   Specimen: Tracheal Aspirate; Respiratory  Result Value Ref Range Status   Specimen Description   Final    TRACHEAL ASPIRATE Performed at [facility], [address] [place], [place] [address]    Special Requests   Final    Normal Performed at [facility], [address] [place], [place] [address]    Gram Stain   Final    RARE WBC PRESENT,BOTH PMN AND MONONUCLEAR FEW GRAM POSITIVE RODS FEW GRAM POSITIVE COCCI IN PAIRS IN CLUSTERS RARE GRAM NEGATIVE RODS    Culture   Final    RARE PSEUDOMONAS AERUGINOSA SUSCEPTIBILITIES TO FOLLOW Performed at [facility], [address], [place], [place] [address]    Report Status PENDING  Incomplete     Radiology Studies: [place] CHEST (PLEURAL EFFUSION)  Result Date: 06/23/2020 CLINICAL DATA:  Pleural effusion.  Dyspnea. EXAM: CHEST ULTRASOUND COMPARISON:  June 22, 2020. FINDINGS: Sonographic evaluation of the right chest demonstrates small right pleural effusion. No adequate fluid pocket for thoracentesis is noted. IMPRESSION: Small right pleural effusion is noted as described above. Electronically Signed   By: Marijo Conception M.D.   On: 06/23/2020 09:42   DG CHEST PORT 1 VIEW  Result Date: 06/24/2020 CLINICAL DATA:  Shortness of breath. EXAM: PORTABLE CHEST 1 VIEW COMPARISON:  06/23/2020. FINDINGS: Endotracheal tube, NG tube in stable position. Right PICC line in unchanged position with tip over right atrium. Prior CABG and cardiac valve replacement. Stent graft noted. Stable cardiomegaly. Bilateral pulmonary infiltrates/edema again  noted without interim change. Persistent right pleural effusion. No pneumothorax. IMPRESSION: 1. Endotracheal tube and NG tube in stable position. Right PICC line in unchanged position with tip over right atrium. 2. Prior CABG and cardiac valve replacement. Stent graft noted. Stable cardiomegaly.  3. Diffuse bilateral pulmonary infiltrates/edema again noted. No interim change. Persistent right pleural effusion. No pneumothorax. Electronically Signed   By: Marcello Moores  Register   On: 06/24/2020 05:36   DG CHEST PORT 1 VIEW  Result Date: 06/23/2020 CLINICAL DATA:  Central line placement EXAM: PORTABLE CHEST 1 VIEW COMPARISON:  06/22/2020 FINDINGS: Right PICC line is in place with the tip in the right atrium approximately 6 cm deep to the cavoatrial junction. NG tube is in the stomach. Endotracheal tube is in the midtrachea, unchanged. Prior median sternotomy. Cardiomegaly. Diffuse airspace disease, right greater than left. Probable layering right pleural effusion. No definite effusion on the left. No pneumothorax. IMPRESSION: Right PICC line tip in the right atrium 6 cm deep to the cavoatrial junction. Diffuse bilateral airspace disease, right greater than left could reflect asymmetric edema or infection. Suspect layering right effusion. Electronically Signed   By: Rolm Baptise M.D.   On: 06/23/2020 17:37   EEG adult  Result Date: 06/23/2020 Lora Havens, MD     06/23/2020  8:06 AM Patient Name: Jay Skinner MRN: [id number] Epilepsy Attending: Lora Havens Referring Physician/Provider: Dr Irwin Brakeman Date: 06/22/2020 Duration: 23.12 mins Patient history: 60yo M with ams. EEG to evaluate for seizure Level of alertness: comatose AEDs during EEG study: Propofol Technical aspects: This EEG study was done with scalp electrodes positioned according to the 10-20 International system of electrode placement. Electrical activity was acquired at a sampling rate of $Remov'500Hz'ZGpIya$  and reviewed with a high frequency filter of  $R'70Hz'pm$  and a low frequency filter of $RemoveB'1Hz'PIRiQacA$ . EEG data were recorded continuously and digitally stored. Description: EEG showed continuous generalized polymorphic sharply contoured 3 to 6 Hz theta-delta slowing. Hyperventilation and photic stimulation were not performed.   ABNORMALITY - Continuous slow, generalized IMPRESSION: This study is suggestive of moderate to severe diffuse encephalopathy, nonspecific etiology. No seizures or epileptiform discharges were seen throughout the recording. Priyanka O Yadav   [place] EKG SITE RITE  Result Date: 06/23/2020 If Site Rite image not attached, placement could not be confirmed due to current cardiac rhythm.   Scheduled Meds: . chlorhexidine gluconate (MEDLINE KIT)  15 mL Mouth Rinse BID  . Chlorhexidine Gluconate Cloth  6 each Topical Daily  . docusate  100 mg Per Tube BID  . enoxaparin (LOVENOX) injection  40 mg Subcutaneous Q24H  . insulin aspart  0-9 Units Subcutaneous Q4H  . levothyroxine  50 mcg Per Tube Daily  . mouth rinse  15 mL Mouth Rinse 10 times per day  . pantoprazole (PROTONIX) IV  40 mg Intravenous Q12H  . polyethylene glycol  17 g Per Tube Daily  . sodium chloride flush  10-40 mL Intracatheter Q12H   Continuous Infusions: . sodium chloride Stopped (06/23/20 1400)  . sodium chloride 100 mL/hr at 06/24/20 1356  . ceFEPime (MAXIPIME) IV Stopped (06/24/20 1021)  . feeding supplement (VITAL AF 1.2 CAL) 1,000 mL (06/22/20 1214)  . norepinephrine (LEVOPHED) Adult infusion Stopped (06/24/20 1325)  . propofol (DIPRIVAN) infusion 25 mcg/kg/min (06/24/20 1451)  . vancomycin Stopped (06/24/20 1203)     LOS: 3 days   Roxan Hockey, MD How to contact the [facility] Attending or Consulting provider [place] or covering provider during after hours [place], for this patient?  1. Check the care team in [facility] and look for a) attending/consulting TRH provider listed and b) the [facility] team listed 2. Log into [email or web address] and use [facility] universal password to  access. If you do not  have the password, please contact the hospital operator. 3. Locate the [facility] provider you are looking for under [facility] and page to a number that you can be directly reached. 4. If you still have difficulty reaching the provider, please page the [facility] [place] (Director on Call) for the Hospitalists listed on amion for assistance.  06/24/2020, 5:59 PM

## 2020-06-25 DIAGNOSIS — Z7189 Other specified counseling: Secondary | ICD-10-CM | POA: Diagnosis not present

## 2020-06-25 DIAGNOSIS — J9602 Acute respiratory failure with hypercapnia: Secondary | ICD-10-CM | POA: Diagnosis not present

## 2020-06-25 DIAGNOSIS — E874 Mixed disorder of acid-base balance: Secondary | ICD-10-CM | POA: Diagnosis not present

## 2020-06-25 DIAGNOSIS — Z515 Encounter for palliative care: Secondary | ICD-10-CM | POA: Diagnosis not present

## 2020-06-25 DIAGNOSIS — R579 Shock, unspecified: Secondary | ICD-10-CM | POA: Diagnosis not present

## 2020-06-25 DIAGNOSIS — J9601 Acute respiratory failure with hypoxia: Secondary | ICD-10-CM | POA: Diagnosis not present

## 2020-06-25 LAB — CBC WITH DIFFERENTIAL/PLATELET
Abs Immature Granulocytes: 0.09 10*3/uL — ABNORMAL HIGH (ref 0.00–0.07)
Basophils Absolute: 0 10*3/uL (ref 0.0–0.1)
Basophils Relative: 1 %
Eosinophils Absolute: 0 10*3/uL (ref 0.0–0.5)
Eosinophils Relative: 0 %
HCT: 35.5 % — ABNORMAL LOW (ref 39.0–52.0)
Hemoglobin: 10.7 g/dL — ABNORMAL LOW (ref 13.0–17.0)
Immature Granulocytes: 1 %
Lymphocytes Relative: 2 %
Lymphs Abs: 0.2 10*3/uL — ABNORMAL LOW (ref 0.7–4.0)
MCH: 33.5 pg (ref 26.0–34.0)
MCHC: 30.1 g/dL (ref 30.0–36.0)
MCV: 111.3 fL — ABNORMAL HIGH (ref 80.0–100.0)
Monocytes Absolute: 0.2 10*3/uL (ref 0.1–1.0)
Monocytes Relative: 3 %
Neutro Abs: 6.2 10*3/uL (ref 1.7–7.7)
Neutrophils Relative %: 93 %
Platelets: 100 10*3/uL — ABNORMAL LOW (ref 150–400)
RBC: 3.19 MIL/uL — ABNORMAL LOW (ref 4.22–5.81)
RDW: 14.6 % (ref 11.5–15.5)
WBC: 6.7 10*3/uL (ref 4.0–10.5)
nRBC: 0.3 % — ABNORMAL HIGH (ref 0.0–0.2)

## 2020-06-25 LAB — COMPREHENSIVE METABOLIC PANEL
ALT: 11 U/L (ref 0–44)
AST: 16 U/L (ref 15–41)
Albumin: 2.6 g/dL — ABNORMAL LOW (ref 3.5–5.0)
Alkaline Phosphatase: 83 U/L (ref 38–126)
Anion gap: 10 (ref 5–15)
BUN: 30 mg/dL — ABNORMAL HIGH (ref 6–20)
CO2: 31 mmol/L (ref 22–32)
Calcium: 7.1 mg/dL — ABNORMAL LOW (ref 8.9–10.3)
Chloride: 98 mmol/L (ref 98–111)
Creatinine, Ser: 1.06 mg/dL (ref 0.61–1.24)
GFR, Estimated: >60 mL/min (ref >60)
Glucose, Bld: 237 mg/dL — ABNORMAL HIGH (ref 70–99)
Potassium: 5.3 mmol/L — ABNORMAL HIGH (ref 3.5–5.1)
Sodium: 139 mmol/L (ref 135–145)
Total Bilirubin: 0.9 mg/dL (ref 0.3–1.2)
Total Protein: 6.7 g/dL (ref 6.5–8.1)

## 2020-06-25 LAB — GLUCOSE, CAPILLARY
Glucose-Capillary: 226 mg/dL — ABNORMAL HIGH (ref 70–99)
Glucose-Capillary: 256 mg/dL — ABNORMAL HIGH (ref 70–99)
Glucose-Capillary: 238 mg/dL — ABNORMAL HIGH (ref 70–99)
Glucose-Capillary: 268 mg/dL — ABNORMAL HIGH (ref 70–99)
Glucose-Capillary: 217 mg/dL — ABNORMAL HIGH (ref 70–99)

## 2020-06-25 LAB — TRIGLYCERIDES: Triglycerides: 87 mg/dL (ref <150)

## 2020-06-25 LAB — CULTURE, RESPIRATORY W GRAM STAIN: Special Requests: NORMAL

## 2020-06-25 LAB — MAGNESIUM: Magnesium: 2.3 mg/dL (ref 1.7–2.4)

## 2020-06-25 MED ORDER — METOLAZONE 5 MG PO TABS
2.5000 mg | ORAL_TABLET | Freq: Once | ORAL | Status: AC
  Administered 2020-06-25: 2.5 mg via ORAL
  Filled 2020-06-25: qty 1

## 2020-06-25 MED ORDER — SODIUM ZIRCONIUM CYCLOSILICATE 10 G PO PACK
10.0000 g | PACK | Freq: Once | ORAL | Status: AC
  Administered 2020-06-25: 10 g
  Filled 2020-06-25: qty 1

## 2020-06-25 MED ORDER — ALBUTEROL SULFATE (2.5 MG/3ML) 0.083% IN NEBU
2.5000 mg | INHALATION_SOLUTION | RESPIRATORY_TRACT | Status: DC | PRN
  Administered 2020-06-25 – 2020-06-30 (×7): 2.5 mg via RESPIRATORY_TRACT
  Filled 2020-06-25 (×8): qty 3

## 2020-06-25 MED ORDER — METHYLPREDNISOLONE SODIUM SUCC 125 MG IJ SOLR
80.0000 mg | Freq: Two times a day (BID) | INTRAMUSCULAR | Status: DC
  Administered 2020-06-25 – 2020-06-27 (×4): 80 mg via INTRAVENOUS
  Filled 2020-06-25 (×4): qty 2

## 2020-06-25 MED ORDER — NAPHAZOLINE-PHENIRAMINE 0.025-0.3 % OP SOLN
1.0000 [drp] | Freq: Four times a day (QID) | OPHTHALMIC | Status: DC | PRN
  Administered 2020-06-25 – 2020-06-29 (×8): 1 [drp] via OPHTHALMIC
  Filled 2020-06-25 (×2): qty 5

## 2020-06-25 MED ORDER — BUMETANIDE 0.25 MG/ML IJ SOLN
0.5000 mg | Freq: Once | INTRAMUSCULAR | Status: AC
  Administered 2020-06-25: 0.5 mg via INTRAVENOUS
  Filled 2020-06-25: qty 4

## 2020-06-25 MED ORDER — FREE WATER
100.0000 mL | Status: DC
  Administered 2020-06-25 – 2020-07-01 (×36): 100 mL

## 2020-06-25 NOTE — Progress Notes (Addendum)
PROGRESS NOTE   Jay Skinner  DAH:[id number] DOB: 03/12/1961 DOA: 07/05/2020 PCP: Assunta Found, MD   Chief Complaint  Patient presents with  . Shortness of Breath   Level of care: ICU  Brief Admission History:  60 y.o. male with medical history significant for completely repaired tetralogy of Fallot , persistent atrial fibrillation , diabetes mellitus, GI bleed, hypertension, chronic respiratory failure on 2 L, paralyzed right diaphragm. History is obtained from chart review as at the time of evaluation patient is intubated.  Pt was brought to the ED via EMS.  Patient was recently discharged from [facility] after admission 3/24-3/27 for GI bleed.  Since discharge to home, patient has steadily declined.  Patient's brother found patient sitting in his chair, sleeping and drooling.  At baseline he is alert and conversant.  Family also reported that patient has had a cough over the past several days.  He was admitted with circulatory shock likely sepsis, pneumonia, acute respiratory failure.    Assessment & Plan:   Principal Problem:   Acute respiratory failure with hypoxia and hypercapnia (HCC) Active Problems:   Gastrointestinal hemorrhage associated with gastric ulcer   HTN (hypertension)   Persistent atrial fibrillation (HCC)   Tetralogy of Fallot   Type 2 diabetes mellitus (HCC)   Shock circulatory (HCC)   Metabolic alkalosis with respiratory acidosis   Severe sepsis with septic shock secondary to community-acquired pneumonia --Patient met sepsis criteria with fevers, tachypnea, leukocytosis and acute hypoxic respiratory failure with persistent hypotension as well -Patient has been weaned off Levophed as of 06/24/2020 -Elevated PCT noted -Leukocytosis resolved on 06/24/2020 (leukocytosis may realcohol with current steroid therapy) -Continue IV cefepime and IV vancomycin pending further culture and clinical data -Overall sepsis pathophysiology appears to be resolving  Acute  respiratory failure with hypoxia and hypercapnia---cannot rule out amiodarone induced lung toxicity-- -Remains intubated and sedated -PCCM consult noted -Discussed with Dr. Sherene Sires -ESR is 107--- cannot rule out amiodarone induced lung toxicity -PTA patient was on amiodarone 400 mg daily, last dose was 06/24/2020 -started IV solu-medrol on 06/24/2020   Right pleural Effusion/possible pulmonary edema -Right-sided ultrasound does not demonstrate a pocket that is big enough to allow for safe thoracentesis -Gentle IV diuresis if BP allows  Persistent atrial fibrillation  -Stopped amiodarone on 06/24/2020-due to concerns about possible amiodarone toxicity as noted above - his apixaban had been on hold temporarily for recent GI bleeding  Chronic diastolic right sided heart failure - Hold bumex and spironolactone temporarily due to sepsis pathophysiology and persistent hypotension requiring IV pressors - Follow I/O, weights, electrolytes  History of recent GI bleeding - IV protonix 40 mg BID for GI protection ordered  Type 2 DM  -Anticipate worsening glycemic control with initiation of IV steroids for amiodarone toxicity -Start Lantus 10 units daily Use Novolog/Humalog Sliding scale insulin with Accu-Cheks/Fingersticks as ordered   Repaired Tetralogy of Fallot - Followed by Dr[facility] Duard [facility] at [facility]  FEN--continue tube feeding via OG tube  Hypothyroidism--continue levothyroxine  - Hypokalemia----replace and recheck  Social/Ethics--palliative care consult appreciated patient is a DNR, with full scope of treatment  -Prophylaxis-Protonix for GI prophylaxis Eliquis and SCDs for DVT prophylaxis  CRITICAL CARE Performed by: Shon Hale   Total critical care time: 43 minutes  Critical care time was exclusive of separately billable procedures and treating other patients.  Critical care was necessary to treat or prevent imminent or life-threatening deterioration. -Remains critically ill,  intubated and sedated Vent Settings:- PRVC/40%/5/15/470 -Off Levophed  Critical care  was time spent personally by me on the following activities: development of treatment plan with patient and/or surrogate as well as nursing, discussions with consultants, evaluation of patient's response to treatment, examination of patient, obtaining history from patient or surrogate, ordering and performing treatments and interventions, ordering and review of laboratory studies, ordering and review of radiographic studies, pulse oximetry and re-evaluation of patient's condition.   DVT prophylaxis: SCD/Lovenox prophylactic dose Code Status: Full  Family Communication: Discussed with brothers Disposition: TBD Status is: Inpatient  Remains inpatient appropriate because:-Remains critically ill,  intubated and sedated   Dispo: The patient is from: Home              Anticipated d/c is to: TBD              Patient currently is not medically stable to d/c.   Difficult to place patient No  Consultants:   PCCM critical care  Procedures:     Antimicrobials:  Cefepime 4/5>> Vancomycin 4/5>>   Subjective: -Remains critically ill, off IV Levophed, intubated and sedated Vent Settings:- PRVC/40%/5/15/470  - no fevers -BP  Improved 06/25/20 -Patient did well with sedation vacation however did poorly with weaning became very hypoxic and was not pulling enough volume -Urine output appears good  Objective: Vitals:   06/25/20 0720 06/25/20 0736 06/25/20 0800 06/25/20 0859  BP: (!) 114/59  (!) 161/77   Pulse: 63 70 74   Resp: 14 14 (!) 23   Temp:  98.3 F (36.8 C)    TempSrc:  Axillary    SpO2: 93% 91% 92% 90%  Weight:      Height:        Intake/Output Summary (Last 24 hours) at 06/25/2020 0956 Last data filed at 06/25/2020 0915 Gross per 24 hour  Intake 3106.16 ml  Output 1175 ml  Net 1931.16 ml   Filed Weights   06/24/20 0500 06/25/20 0245 06/25/20 0438  Weight: 82.6 kg 82.2 kg 82.6 kg     Examination:  General exam: Pt sedated, intubated on vent,   HEENT-ET and OG tubes Respiratory system: Fair bilateral air movement, no wheezing Cardiovascular system: normal S1 & S2 heard.  Prior sternotomy scar Gastrointestinal system: Abdomen is nondistended, soft . Normal bowel sounds heard. Central nervous system: sedated on vent. No gross focal neurological deficits. Extremities: no cyanosis seen. Skin: No gross lesions seen.  Psychiatry: unable to assess.  MSK--right arm PICC line GU-Foley cath in situ with clear urine  Data Reviewed: I have personally reviewed following labs and imaging studies  CBC: Recent Labs  Lab 06/28/2020 1849 06/22/20 0610 06/23/20 0657 06/24/20 0637 06/25/20 0441  WBC 5.3 11.4* 12.0* 8.3 6.7  NEUTROABS 4.1  --  9.6* 6.6 6.2  HGB 14.3 11.8* 10.5* 10.2* 10.7*  HCT 42.8 37.1* 33.3* 33.4* 35.5*  MCV 112.3* 111.1* 106.4* 109.2* 111.3*  PLT 152 144* 130* 110* 100*    Basic Metabolic Panel: Recent Labs  Lab 06/20/2020 1849 06/22/20 0610 06/22/20 1955 06/23/20 0657 06/23/20 2121 06/24/20 0637 06/25/20 0441  NA 138 141  --  138  --  140 139  K 4.6 4.0  --  3.0*  --  4.6 5.3*  CL 77* 79*  --  89*  --  97* 98  CO2 47* 43*  --  38*  --  35* 31  GLUCOSE 203* 104*  --  173*  --  154* 237*  BUN 22* 28*  --  28*  --  26* 30*  CREATININE 1.17  1.53*  --  1.10  --  1.03 1.06  CALCIUM 9.3 8.7*  --  7.2*  --  7.1* 7.1*  MG  --   --   --  1.6*  --  2.1 2.3  PHOS  --   --  2.0* 2.9 2.4*  --   --     GFR: Estimated Creatinine Clearance: 72.8 mL/min (by C-G formula based on SCr of 1.06 mg/dL).  Liver Function Tests: Recent Labs  Lab 07/13/2020 1849 06/23/20 0657 06/24/20 0637 06/25/20 0441  AST $Re'22 19 17 16  'xPP$ ALT $R'14 9 9 11  'Rc$ ALKPHOS 69 50 75 83  BILITOT 1.0 1.3* 0.8 0.9  PROT 8.6* 5.9* 6.2* 6.7  ALBUMIN 4.3 2.7* 2.7* 2.6*    CBG: Recent Labs  Lab 06/24/20 2134 06/24/20 2136 06/24/20 2305 06/25/20 0349 06/25/20 0737  GLUCAP 184* 196*  221* 226* 256*    Recent Results (from the past 240 hour(s))  Resp Panel by RT-PCR (Flu A&B, Covid) Nasopharyngeal Swab     Status: None   Collection Time: 07/17/2020  7:04 PM   Specimen: Nasopharyngeal Swab; Nasopharyngeal(NP) swabs in vial transport medium  Result Value Ref Range Status   SARS Coronavirus 2 by RT PCR NEGATIVE NEGATIVE Final    Comment: (NOTE) SARS-CoV-2 target nucleic acids are NOT DETECTED.  The SARS-CoV-2 RNA is generally detectable in upper respiratory specimens during the acute phase of infection. The lowest concentration of SARS-CoV-2 viral copies this assay can detect is 138 copies/mL. A negative result does not preclude SARS-Cov-2 infection and should not be used as the sole basis for treatment or other patient management decisions. A negative result may occur with  improper specimen collection/handling, submission of specimen other than nasopharyngeal swab, presence of viral mutation(s) within the areas targeted by this assay, and inadequate number of viral copies(<138 copies/mL). A negative result must be combined with clinical observations, patient history, and epidemiological information. The expected result is Negative.  Fact Sheet for Patients:  EntrepreneurPulse.com.au  Fact Sheet for Healthcare Providers:  IncredibleEmployment.be  This test is no t yet approved or cleared by the [place] FDA and  has been authorized for detection and/or diagnosis of SARS-CoV-2 by FDA under an Emergency Use Authorization (EUA). This EUA will remain  in effect (meaning this test can be used) for the duration of the COVID-19 declaration under Section 564(b)(1) of the Act, 21 U.S.C.section 360bbb-3(b)(1), unless the authorization is terminated  or revoked sooner.       Influenza A by PCR NEGATIVE NEGATIVE Final   Influenza B by PCR NEGATIVE NEGATIVE Final    Comment: (NOTE) The Xpert Xpress SARS-CoV-2/FLU/RSV plus assay is  intended as an aid in the diagnosis of influenza from Nasopharyngeal swab specimens and should not be used as a sole basis for treatment. Nasal washings and aspirates are unacceptable for Xpert Xpress SARS-CoV-2/FLU/RSV testing.  Fact Sheet for Patients: EntrepreneurPulse.com.au  Fact Sheet for Healthcare Providers: IncredibleEmployment.be  This test is not yet approved or cleared by the [place] FDA and has been authorized for detection and/or diagnosis of SARS-CoV-2 by FDA under an Emergency Use Authorization (EUA). This EUA will remain in effect (meaning this test can be used) for the duration of the COVID-19 declaration under Section 564(b)(1) of the Act, 21 U.S.C. section 360bbb-3(b)(1), unless the authorization is terminated or revoked.  Performed at [facility], [address], [place], [place] [address]   Culture, blood (single)     Status: None (Preliminary result)  Collection Time: 07/11/2020  8:28 PM   Specimen: BLOOD RIGHT HAND  Result Value Ref Range Status   Specimen Description BLOOD RIGHT HAND  Final   Special Requests   Final    BOTTLES DRAWN AEROBIC AND ANAEROBIC Blood Culture adequate volume   Culture   Final    NO GROWTH 4 DAYS Performed at [facility], [address], [place], [place] [address]    Report Status PENDING  Incomplete  MRSA PCR Screening     Status: None   Collection Time: 06/22/20 12:29 AM   Specimen: Nasal Mucosa; Nasopharyngeal  Result Value Ref Range Status   MRSA by PCR NEGATIVE NEGATIVE Final    Comment:        The GeneXpert MRSA Assay (FDA approved for NASAL specimens only), is one component of a comprehensive MRSA colonization surveillance program. It is not intended to diagnose MRSA infection nor to guide or monitor treatment for MRSA infections. Performed at [facility], [address], [place], [place] [address]   Culture, blood (routine x 2)     Status: None (Preliminary result)    Collection Time: 06/22/20  6:00 AM   Specimen: Left Antecubital; Blood  Result Value Ref Range Status   Specimen Description LEFT ANTECUBITAL  Final   Special Requests   Final    BOTTLES DRAWN AEROBIC AND ANAEROBIC Blood Culture adequate volume   Culture   Final    NO GROWTH 3 DAYS Performed at [facility], [address], [place], [place] [address]    Report Status PENDING  Incomplete  Culture, blood (routine x 2)     Status: None (Preliminary result)   Collection Time: 06/22/20  6:09 AM   Specimen: BLOOD LEFT HAND  Result Value Ref Range Status   Specimen Description BLOOD LEFT HAND  Final   Special Requests   Final    BOTTLES DRAWN AEROBIC AND ANAEROBIC Blood Culture adequate volume   Culture   Final    NO GROWTH 3 DAYS Performed at [facility], [address], [place], [place] [address]    Report Status PENDING  Incomplete  Culture, Respiratory w Gram Stain     Status: None (Preliminary result)   Collection Time: 06/22/20  8:42 AM   Specimen: Tracheal Aspirate; Respiratory  Result Value Ref Range Status   Specimen Description   Final    TRACHEAL ASPIRATE Performed at [facility], [address] [place], [place] [address]    Special Requests   Final    Normal Performed at [facility], [address], [place], [place] [address]    Gram Stain   Final    RARE WBC PRESENT,BOTH PMN AND MONONUCLEAR FEW GRAM POSITIVE RODS FEW GRAM POSITIVE COCCI IN PAIRS IN CLUSTERS RARE GRAM NEGATIVE RODS    Culture   Final    RARE PSEUDOMONAS AERUGINOSA SUSCEPTIBILITIES TO FOLLOW Performed at [facility], [place] [address], [place],  [address]    Report Status PENDING  Incomplete     Radiology Studies: DG CHEST PORT 1 VIEW  Result Date: 06/24/2020 CLINICAL DATA:  Shortness of breath. EXAM: PORTABLE CHEST 1 VIEW COMPARISON:  06/23/2020. FINDINGS: Endotracheal tube, NG tube in stable position. Right PICC line in unchanged position with tip over right atrium. Prior CABG and  cardiac valve replacement. Stent graft noted. Stable cardiomegaly. Bilateral pulmonary infiltrates/edema again noted without interim change. Persistent right pleural effusion. No pneumothorax. IMPRESSION: 1. Endotracheal tube and NG tube in stable position. Right PICC line  in unchanged position with tip over right atrium. 2. Prior CABG and cardiac valve replacement. Stent graft noted. Stable cardiomegaly. 3. Diffuse bilateral pulmonary infiltrates/edema again noted. No interim change. Persistent right pleural effusion. No pneumothorax. Electronically Signed   By: Marcello Moores  Register   On: 06/24/2020 05:36   DG CHEST PORT 1 VIEW  Result Date: 06/23/2020 CLINICAL DATA:  Central line placement EXAM: PORTABLE CHEST 1 VIEW COMPARISON:  06/22/2020 FINDINGS: Right PICC line is in place with the tip in the right atrium approximately 6 cm deep to the cavoatrial junction. NG tube is in the stomach. Endotracheal tube is in the midtrachea, unchanged. Prior median sternotomy. Cardiomegaly. Diffuse airspace disease, right greater than left. Probable layering right pleural effusion. No definite effusion on the left. No pneumothorax. IMPRESSION: Right PICC line tip in the right atrium 6 cm deep to the cavoatrial junction. Diffuse bilateral airspace disease, right greater than left could reflect asymmetric edema or infection. Suspect layering right effusion. Electronically Signed   By: Rolm Baptise M.D.   On: 06/23/2020 17:37   [place] EKG SITE RITE  Result Date: 06/23/2020 If Site Rite image not attached, placement could not be confirmed due to current cardiac rhythm.   Scheduled Meds: . bumetanide (BUMEX) IV  0.5 mg Intravenous Once  . chlorhexidine gluconate (MEDLINE KIT)  15 mL Mouth Rinse BID  . Chlorhexidine Gluconate Cloth  6 each Topical Daily  . docusate  100 mg Per Tube BID  . enoxaparin (LOVENOX) injection  40 mg Subcutaneous Q24H  . insulin aspart  0-20 Units Subcutaneous TID WC  . insulin aspart  0-5 Units  Subcutaneous QHS  . insulin glargine  10 Units Subcutaneous Daily  . levothyroxine  50 mcg Per Tube Daily  . mouth rinse  15 mL Mouth Rinse 10 times per day  . methylPREDNISolone (SOLU-MEDROL) injection  80 mg Intravenous Q12H  . pantoprazole (PROTONIX) IV  40 mg Intravenous Q12H  . polyethylene glycol  17 g Per Tube Daily  . sodium chloride flush  10-40 mL Intracatheter Q12H   Continuous Infusions: . sodium chloride Stopped (06/23/20 1400)  . ceFEPime (MAXIPIME) IV Stopped (06/25/20 0112)  . feeding supplement (VITAL AF 1.2 CAL) 1,000 mL (06/22/20 1214)  . norepinephrine (LEVOPHED) Adult infusion Stopped (06/24/20 1325)  . propofol (DIPRIVAN) infusion Stopped (06/25/20 0716)  . vancomycin Stopped (06/24/20 1203)     LOS: 4 days   Roxan Hockey, MD How to contact the [facility] Attending or Consulting provider [place] or covering provider during after hours [place], for this patient?  1. Check the care team in [facility] and look for a) attending/consulting TRH provider listed and b) the [facility] team listed 2. Log into [email or web address] and use [facility]'s universal password to access. If you do not have the password, please contact the hospital operator. 3. Locate the [facility] provider you are looking for under [facility] and page to a number that you can be directly reached. 4. If you still have difficulty reaching the provider, please page the [facility] (Director on Call) for the Hospitalists listed on amion for assistance.  06/25/2020, 9:56 AM

## 2020-06-25 NOTE — Progress Notes (Signed)
NAME:  Jay Skinner, MRN:  [id number], DOB:  10-Dec-1960, LOS: 4 ADMISSION DATE:  06/24/2020, CONSULTATION DATE:  4/5 REFERRING MD:  Aline August CHIEF COMPLAINT:  Acute on chronic hypoxemic/hypercarbic Resp failure  Brief patient profile:    History of Present Illness:   60 y.o. male with medical history significant for completely repaired tetralogy of Fallot , persistent atrial fibrillation , diabetes mellitus, GI bleed, hypertension, chronic respiratory failure on 2 L, paralyzed right diaphragm. History is obtained from chart review as at the time of evaluation patient is intubated.  Patient was brought to the ED via EMS.  Patient was recently discharged from [facility] after admission 3/24-3/27 for GI bleed.  Since discharge to home, patient has steadily declined.  Today patient's brother found patient sitting in his chair, sleeping and drooling.  At baseline he is alert and conversant.  Family also reported that patient has had a cough over the past several days.  Hospitalization here at [facility] 3/17 - 3/18-for acute upper GI bleed and acute gastroenteritis.  EGD was deferred for patient to follow-up with his gastroenterologist at [facility].  Hemoglobin remained stable at 11.8 during admission.  He was advised to stop taking his apixaban until he followed up with GI.  ED Course:  Heart rate 67s to 80s.  resp rate 24-32.  Blood pressure systolic 465-681.  O2 sats 91 to 97% on 6 L.  Venous blood gas showed pH of 7.1, VCO2 greater than 120.  BNP 368.  Lactic acid 1.1.  Initial portable chest x-ray showed-complete opacification of the right hemithorax, perihilar infiltration on the left.  Post intubation shunt chest x-ray shows significant improvement in   aeration of right lung.  Patient was initially tried on BiPAP and subsequently intubated. ED provider talked to critical care, okay with admission at [facility].  Pertinent  Medical History  Arthritis Asthma Heart disease sp   completely repaired tetralogy of Fallot History of bleeding ulcers Hypertension Hypothyroidism  Significant Hospital Events: Including procedures, antibiotic start and stop dates in addition to other pertinent events   . Resp viral panel 4/4 > neg covid/ flu  . BC x 1  4/4 >>> . ET 4/4 >> . BC x 2 4/5 >>> . MRSA PCR 4/5>  Neg . Resp culture/et 4/5 >>>  Mixed orgs >>> rare pseudomonaas . Maxepime 4/6 >>> . Vanc IV 4/6  >  4/7  EEG  4/5 >>> moderate to severe diffuse encephalopathy, nonspecific etiology. No seizures or epileptiform discharges were seen throughout the recording. Marland Kitchen [place] R chest 4/6 > not enough effusion to tap  . R PIC 4/6 >>> . ESR  4/7  = 107  > solumedrol 60 q 8 and d/c'd amiodarone   Scheduled Meds: . chlorhexidine gluconate (MEDLINE KIT)  15 mL Mouth Rinse BID  . Chlorhexidine Gluconate Cloth  6 each Topical Daily  . docusate  100 mg Per Tube BID  . enoxaparin (LOVENOX) injection  40 mg Subcutaneous Q24H  . free water  100 mL Per Tube Q4H  . insulin aspart  0-20 Units Subcutaneous TID WC  . insulin aspart  0-5 Units Subcutaneous QHS  . insulin glargine  10 Units Subcutaneous Daily  . levothyroxine  50 mcg Per Tube Daily  . mouth rinse  15 mL Mouth Rinse 10 times per day  . methylPREDNISolone (SOLU-MEDROL) injection  80 mg Intravenous Q12H  . pantoprazole (PROTONIX) IV  40 mg Intravenous Q12H  . polyethylene glycol  17 g Per Tube Daily  . sodium chloride flush  10-40 mL Intracatheter Q12H   Continuous Infusions: . sodium chloride Stopped (06/23/20 1400)  . ceFEPime (MAXIPIME) IV Stopped (06/25/20 1031)  . feeding supplement (VITAL AF 1.2 CAL) 1,000 mL (06/25/20 0957)  . norepinephrine (LEVOPHED) Adult infusion Stopped (06/24/20 1325)  . propofol (DIPRIVAN) infusion Stopped (06/25/20 0716)  . vancomycin Stopped (06/25/20 1252)   PRN Meds:.acetaminophen **OR** acetaminophen, albuterol, fentaNYL (SUBLIMAZE) injection, fentaNYL (SUBLIMAZE) injection,  naphazoline-pheniramine, polyethylene glycol, sodium chloride flush    Interim History / Subjective:  More alert/ failed PSV due to hypoxemia on 5 peep/ 40 %   Objective   Blood pressure 134/72, pulse 71, temperature 98.4 F (36.9 C), temperature source Axillary, resp. rate (!) 21, height _0  (1.626 m), weight 82.6 kg, SpO2 91 %. CVP:  [26 mmHg-34 mmHg] 33 mmHg  Vent Mode: PRVC FiO2 (%):  [40 %] 40 % Set Rate:  [14 bmp] 14 bmp Vt Set:  [470 mL] 470 mL PEEP:  [5 cmH20] 5 cmH20 Plateau Pressure:  [26 cmH20-32 cmH20] 32 cmH20   Intake/Output Summary (Last 24 hours) at 06/25/2020 1301 Last data filed at 06/25/2020 1253 Gross per 24 hour  Intake 3758.79 ml  Output 1175 ml  Net 2583.79 ml   Filed Weights   06/24/20 0500 06/25/20 0245 06/25/20 0438  Weight: 82.6 kg 82.2 kg 82.6 kg   CVP:  [26 mmHg-34 mmHg] 33 mmHg   Examination: Tmax  98.3  Pt  sedated on vent / breathing 4 above  back up rate  No jvd Oropharynx  ET /og Neck supple Lungs with a  scattered exp > insp rhonchi bilaterally/ secretions mucoid RRR no s3 or or significant  murmur Abd mod distended with decreased bs/ excursion limited  Ext warm with trace bilateral ext  edema      Resolved Hospital Problem list      Assessment & Plan:  1) Acute on chronic hypercabic and hypoxemic Resp failure with post hypercapneic met alkalosis - Well compensated on vent, no air trapping but not weaning well as of 4/8 with limitations: A) poor abd compliance ? Ileus > working on getting gut moving > consider reglan IV/ minimize narcs    2) Prob HCAP vs asp pna on R in pt with chronically elevated RHD ? 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 - off levophed as of pm 4/7 with cvp excessive > diuresis per triad    5) AKI secondary to 4 Lab Results   Component Value Date   CREATININE 1.06 06/25/2020   CREATININE 1.03 06/24/2020   CREATININE 1.10 06/23/2020   >> adequate circ volume per CVP/ continue to monitor   Best practice (right click and "Reselect all SmartList Selections" daily)  Diet:  Tube Feed  Pain/Anxiety/Delirium protocol (if indicated): Yes (RASS goal -1) VAP protocol (if indicated): Yes DVT prophylaxis: LMWH and SCD GI prophylaxis: PPI Glucose control:  SSI No Central venous access:  Yes, and it is still needed Arterial line:  N/A Foley:  N/A Mobility:  OOB  PT consulted: N/A Last date of multidisciplinary goals of care discussion per Triad/ pallitive care > treat the treatable, no escalation planned Code Status:  DNR Disposition: ICU   Labs   CBC: Recent Labs  Lab 07/04/2020 1849 06/22/20 0610 06/23/20  2505 06/24/20 0637 06/25/20 0441  WBC 5.3 11.4* 12.0* 8.3 6.7  NEUTROABS 4.1  --  9.6* 6.6 6.2  HGB 14.3 11.8* 10.5* 10.2* 10.7*  HCT 42.8 37.1* 33.3* 33.4* 35.5*  MCV 112.3* 111.1* 106.4* 109.2* 111.3*  PLT 152 144* 130* 110* 100*    Basic Metabolic Panel: Recent Labs  Lab 07/12/2020 1849 06/22/20 0610 06/22/20 1955 06/23/20 0657 06/23/20 2121 06/24/20 0637 06/25/20 0441  NA 138 141  --  138  --  140 139  K 4.6 4.0  --  3.0*  --  4.6 5.3*  CL 77* 79*  --  89*  --  97* 98  CO2 47* 43*  --  38*  --  35* 31  GLUCOSE 203* 104*  --  173*  --  154* 237*  BUN 22* 28*  --  28*  --  26* 30*  CREATININE 1.17 1.53*  --  1.10  --  1.03 1.06  CALCIUM 9.3 8.7*  --  7.2*  --  7.1* 7.1*  MG  --   --   --  1.6*  --  2.1 2.3  PHOS  --   --  2.0* 2.9 2.4*  --   --    GFR: Estimated Creatinine Clearance: 72.8 mL/min (by C-G formula based on SCr of 1.06 mg/dL). Recent Labs  Lab 07/12/2020 1849 07/16/2020 2127 06/22/20 0610 06/23/20 0657 06/24/20 0637 06/25/20 0441  PROCALCITON  --   --  0.50 0.66 0.49  --   WBC 5.3  --  11.4* 12.0* 8.3 6.7  LATICACIDVEN 1.1 1.8  --   --   --   --     Liver Function  Tests: Recent Labs  Lab 07/03/2020 1849 06/23/20 0657 06/24/20 0637 06/25/20 0441  AST _0 ALT _1 ALKPHOS 69 50 75 83  BILITOT 1.0 1.3* 0.8 0.9  PROT 8.6* 5.9* 6.2* 6.7  ALBUMIN 4.3 2.7* 2.7* 2.6*   No results for input(s): LIPASE, AMYLASE in the last 168 hours. No results for input(s): AMMONIA in the last 168 hours.  ABG    Component Value Date/Time   PHART 7.520 (H) 06/22/2020 1337   PCO2ART 57.3 (H) 06/22/2020 1337   PO2ART 169 (H) 06/22/2020 1337   HCO3 44.6 (H) 06/22/2020 1337   ACIDBASEDEF NOT CALCULATED 06/30/2020 2030   O2SAT 99.1 06/22/2020 1337     Coagulation Profile: No results for input(s): INR, PROTIME in the last 168 hours.  Cardiac Enzymes: No results for input(s): CKTOTAL, CKMB, CKMBINDEX, TROPONINI in the last 168 hours.  HbA1C: No results found for: HGBA1C  CBG: Recent Labs  Lab 06/24/20 2136 06/24/20 2305 06/25/20 0349 06/25/20 0737 06/25/20 1125  GLUCAP 196* 221* 226* 256* 238*       The patient is critically ill with multiple organ systems failure and requires high complexity decision making for assessment and support, frequent evaluation and titration of therapies, application of advanced monitoring technologies and extensive interpretation of multiple databases. Critical Care Time devoted to patient care services described in this note is 35 minutes.   Christinia Gully, MD Pulmonary and [place] [phone]   After 7:00 pm call Elink  [phone]

## 2020-06-25 NOTE — Progress Notes (Signed)
Pharmacy Antibiotic Note  Jay Skinner is a 60 y.o. male admitted on 07/10/2020 with sepsis.  Pharmacy has been consulted for Cefepime dosing.  Plan: Cefepime 2gm IV q8h F/U cxs and clinical progress Monitor V/S, labs and levels as indicated  Height: 5\' 4"  (162.6 cm) Weight: 82.6 kg (182 lb 1.6 oz) IBW/kg (Calculated) : 59.2  Temp (24hrs), Avg:98 F (36.7 C), Min:97.1 F (36.2 C), Max:98.4 F (36.9 C)  Recent Labs  Lab 06/27/2020 1849 06/28/2020 2127 06/22/20 0610 06/23/20 0657 06/24/20 0637 06/25/20 0441  WBC 5.3  --  11.4* 12.0* 8.3 6.7  CREATININE 1.17  --  1.53* 1.10 1.03 1.06  LATICACIDVEN 1.1 1.8  --   --   --   --     Estimated Creatinine Clearance: 72.8 mL/min (by C-G formula based on SCr of 1.06 mg/dL).    Allergies  Allergen Reactions  . Codeine Hives  . Lasix [Furosemide] Hives  . Latex Other (See Comments)  . Penicillins Dermatitis    Antimicrobials this admission: Vancomycin 4/5 >> 4/8 Cefepime 4/5 >>   Microbiology results: 4/5 BCx: ngtd 4/5 tracheal aspirate Cx: pseudomonas   MRSA PCR: neg  Thank you for allowing pharmacy to be a part of this patient's care.  Margot Ables, PharmD Clinical Pharmacist 06/25/2020 2:39 PM

## 2020-06-25 NOTE — Care Management Important Message (Signed)
Important Message  Patient Details  Name: Jay Skinner MRN: [id number] Date of Birth: 27-Jan-1961   Medicare Important Message Given:  Yes     Tommy Medal 06/25/2020, 3:52 PM

## 2020-06-25 NOTE — Progress Notes (Signed)
Patient's brother took pt's clothes home

## 2020-06-25 NOTE — Progress Notes (Signed)
Palliative:  HPI: 60 y.o. male  with past medical history of repaired tetralogy of Fallot, diastolic heart failure, persistent atrial fibrillation on apixaban, hypertension, chronic respiratory failure on 2L, paralyzed right diaphragm, diabetes, GI bleed (admission at [facility] 3/24-3/27 for GIB) admitted on 07/08/2020 with decline with cough, weakness since discharge from [place] and admitted with sepsis pneumonia requiring mechanical ventilation.   I met today at [facility] bedside with brother Jay Skinner. We discussed that Jay Skinner is not yet ready for extubation today. Discussed plan for diuresis and potential amiodarone toxicity and treatment with steroids. Hopefully after receiving these treatments Jay Skinner will have improved lung function in order to successfully be extubated. I also spent some time discussing with Jay Skinner that we need to consider wishes for reintubation in the event that Jay Skinner is extubated and fails extubation and has further respiratory decline. I explained that we would avoid reintubation naturally and utilize medications and other interventions to support Jay Skinner but we should have a plan in place if they would him placed back on life support again. We did discuss that if we optimize Jay Skinner and he is extubated I would worry about his prognosis if he were to decline further and I would want to consider the risks/discomfort of intubation vs benefits in this situation. Jay Skinner understands and plans to speak with his family more about this today. Jay Skinner has a son who is EMT and he values his input as well.   All questions/concerns addressed. Emotional support provided. Discussed with Dr. Denton Brick.   Exam: Not as alert as previous days. No following commands today. Tolerating vent on low dose propofol (coughing without propofol per RN). No distress. Abd soft.   Plan: - Hopeful for improvement and extubation in near future. Family are discussing/considering their wishes for reintubation in the event he is  optimized and extubated over the weekend.   25 min  Vinie Sill, NP Palliative Medicine Team Pager [phone] (Please see [email or web address] for schedule) Team Phone [phone]    Greater than 50%  of this time was spent counseling and coordinating care related to the above assessment and plan

## 2020-06-26 DIAGNOSIS — J9602 Acute respiratory failure with hypercapnia: Secondary | ICD-10-CM | POA: Diagnosis not present

## 2020-06-26 DIAGNOSIS — J9601 Acute respiratory failure with hypoxia: Secondary | ICD-10-CM | POA: Diagnosis not present

## 2020-06-26 LAB — CULTURE, BLOOD (SINGLE)
Culture: NO GROWTH
Special Requests: ADEQUATE

## 2020-06-26 LAB — COMPREHENSIVE METABOLIC PANEL
ALT: 35 U/L (ref 0–44)
AST: 48 U/L — ABNORMAL HIGH (ref 15–41)
Albumin: 2.5 g/dL — ABNORMAL LOW (ref 3.5–5.0)
Alkaline Phosphatase: 74 U/L (ref 38–126)
Anion gap: 10 (ref 5–15)
BUN: 55 mg/dL — ABNORMAL HIGH (ref 6–20)
CO2: 30 mmol/L (ref 22–32)
Calcium: 6.9 mg/dL — ABNORMAL LOW (ref 8.9–10.3)
Chloride: 98 mmol/L (ref 98–111)
Creatinine, Ser: 1.56 mg/dL — ABNORMAL HIGH (ref 0.61–1.24)
GFR, Estimated: 51 mL/min — ABNORMAL LOW (ref >60)
Glucose, Bld: 298 mg/dL — ABNORMAL HIGH (ref 70–99)
Potassium: 5.1 mmol/L (ref 3.5–5.1)
Sodium: 138 mmol/L (ref 135–145)
Total Bilirubin: 0.7 mg/dL (ref 0.3–1.2)
Total Protein: 6.3 g/dL — ABNORMAL LOW (ref 6.5–8.1)

## 2020-06-26 LAB — CBC WITH DIFFERENTIAL/PLATELET
Abs Immature Granulocytes: 0.38 10*3/uL — ABNORMAL HIGH (ref 0.00–0.07)
Basophils Absolute: 0.1 10*3/uL (ref 0.0–0.1)
Basophils Relative: 1 %
Eosinophils Absolute: 0 10*3/uL (ref 0.0–0.5)
Eosinophils Relative: 0 %
HCT: 32.9 % — ABNORMAL LOW (ref 39.0–52.0)
Hemoglobin: 10.1 g/dL — ABNORMAL LOW (ref 13.0–17.0)
Immature Granulocytes: 5 %
Lymphocytes Relative: 4 %
Lymphs Abs: 0.3 10*3/uL — ABNORMAL LOW (ref 0.7–4.0)
MCH: 34.2 pg — ABNORMAL HIGH (ref 26.0–34.0)
MCHC: 30.7 g/dL (ref 30.0–36.0)
MCV: 111.5 fL — ABNORMAL HIGH (ref 80.0–100.0)
Monocytes Absolute: 0.7 10*3/uL (ref 0.1–1.0)
Monocytes Relative: 9 %
Neutro Abs: 6.4 10*3/uL (ref 1.7–7.7)
Neutrophils Relative %: 81 %
Platelets: 121 10*3/uL — ABNORMAL LOW (ref 150–400)
RBC: 2.95 MIL/uL — ABNORMAL LOW (ref 4.22–5.81)
RDW: 14.6 % (ref 11.5–15.5)
WBC: 7.9 10*3/uL (ref 4.0–10.5)
nRBC: 1.3 % — ABNORMAL HIGH (ref 0.0–0.2)

## 2020-06-26 LAB — GLUCOSE, CAPILLARY
Glucose-Capillary: 283 mg/dL — ABNORMAL HIGH (ref 70–99)
Glucose-Capillary: 239 mg/dL — ABNORMAL HIGH (ref 70–99)
Glucose-Capillary: 295 mg/dL — ABNORMAL HIGH (ref 70–99)
Glucose-Capillary: 282 mg/dL — ABNORMAL HIGH (ref 70–99)
Glucose-Capillary: 188 mg/dL — ABNORMAL HIGH (ref 70–99)
Glucose-Capillary: 271 mg/dL — ABNORMAL HIGH (ref 70–99)

## 2020-06-26 LAB — MAGNESIUM: Magnesium: 2.4 mg/dL (ref 1.7–2.4)

## 2020-06-26 MED ORDER — SODIUM CHLORIDE 0.9 % IV SOLN: INTRAVENOUS | Status: DC

## 2020-06-26 NOTE — Progress Notes (Signed)
PROGRESS NOTE   [facility]  KYH:[id number] DOB: 02/24/1961 DOA: 06/22/2020 PCP: Jay Sites, MD   Chief Complaint  Patient presents with  . Shortness of Breath   Level of care: ICU  Brief Admission History:  60 y.o. male with medical history significant for completely repaired tetralogy of Fallot , persistent atrial fibrillation , diabetes mellitus, GI bleed, hypertension, chronic respiratory failure on 2 L, paralyzed right diaphragm. History is obtained from chart review as at the time of evaluation patient is intubated.  Pt was brought to the ED via EMS.  Patient was recently discharged from [facility] after admission 3/24-3/27 for GI bleed.  Since discharge to home, patient has steadily declined.  Patient's brother found patient sitting in his chair, sleeping and drooling.  At baseline he is alert and conversant.  Family also reported that patient has had a cough over the past several days.  He was admitted with circulatory shock likely sepsis, pneumonia, acute respiratory failure.    Assessment & Plan:   Principal Problem:   Acute respiratory failure with hypoxia and hypercapnia (HCC) Active Problems:   Gastrointestinal hemorrhage associated with gastric ulcer   HTN (hypertension)   Persistent atrial fibrillation (HCC)   Tetralogy of Fallot   Type 2 diabetes mellitus (HCC)   Shock circulatory (HCC)   Metabolic alkalosis with respiratory acidosis   Severe sepsis with septic shock secondary to community-acquired pneumonia --Patient met sepsis criteria with fevers, tachypnea, leukocytosis and acute hypoxic respiratory failure with persistent hypotension as well -Patient has been weaned off Levophed as of 06/24/2020 -Elevated PCT noted -Leukocytosis resolved on 06/24/2020 (leukocytosis may realcohol with current steroid therapy) -Continue IV cefepime and IV vancomycin pending further culture and clinical data -Overall sepsis pathophysiology appears to be resolving  Acute  respiratory failure with hypoxia and hypercapnia---cannot rule out amiodarone induced lung toxicity-- -Remains intubated and sedated -PCCM consult noted -Discussed with Dr. Melvyn Skinner -ESR is 107--- cannot rule out amiodarone induced lung toxicity -PTA patient was on amiodarone 400 mg daily, last dose was 06/24/2020 -started IV solu-medrol on 06/24/2020   Right pleural Effusion/possible pulmonary edema -Right-sided ultrasound does not demonstrate a pocket that is big enough to allow for safe thoracentesis -Gentle IV diuresis if BP allows  Persistent atrial fibrillation  -Stopped amiodarone on 06/24/2020-due to concerns about possible amiodarone toxicity as noted above - his apixaban had been on hold temporarily for recent GI bleeding  Chronic diastolic right sided heart failure - Hold bumex and spironolactone temporarily due to sepsis pathophysiology and persistent hypotension requiring IV pressors - Follow I/O, weights, electrolytes  History of recent GI bleeding - IV protonix 40 mg BID for GI protection ordered  Type 2 DM  -Anticipate worsening glycemic control with initiation of IV steroids for amiodarone toxicity -Start Lantus 10 units daily Use Novolog/Humalog Sliding scale insulin with Accu-Cheks/Fingersticks as ordered   Repaired Tetralogy of Fallot - Followed by [facility] at [facility]  FEN--continue tube feeding via OG tube  Hypothyroidism--continue levothyroxine  - Hypokalemia----replace and recheck  AKI----acute kidney injury - creatinine 1.56 from 1.0--suspect due to diuresis -Urine output is adequate renally adjust medications, avoid nephrotoxic agents / dehydration  / hypotension   Social/Ethics--palliative care consult appreciated patient is a DNR, with full scope of treatment  -Prophylaxis-Protonix for GI prophylaxis Eliquis and SCDs for DVT prophylaxis  CRITICAL CARE Performed by: [facility]   Total critical care time: 48 minutes  Critical care time was  exclusive of separately billable procedures and treating other  patients.  Critical care was necessary to treat or prevent imminent or life-threatening deterioration. -Remains critically ill, intubated and sedated Vent Settings:- PRVC/40%/5/15/470 -Off Levophed  Critical care was time spent personally by me on the following activities: development of treatment plan with patient and/or surrogate as well as nursing, discussions with consultants, evaluation of patient's response to treatment, examination of patient, obtaining history from patient or surrogate, ordering and performing treatments and interventions, ordering and review of laboratory studies, ordering and review of radiographic studies, pulse oximetry and re-evaluation of patient's condition.   DVT prophylaxis: SCD/Lovenox prophylactic dose Code Status: Full  Family Communication: Discussed with brothers Disposition: TBD Status is: Inpatient  Remains inpatient appropriate because:-Remains critically ill,  intubated and sedated   Dispo: The patient is from: Home              Anticipated d/c is to: TBD              Patient currently is not medically stable to d/c.   Difficult to place patient No  Consultants:   PCCM critical care  Procedures:     Antimicrobials:  Cefepime 4/5>> Vancomycin 4/5>>   Subjective: -Remains critically ill, off IV Levophed, intubated and sedated Vent Settings:- PRVC/40%/5/15/470  - 06/26/20 -Weaning better today, urine output is adequate  Objective: Vitals:   06/26/20 1300 06/26/20 1400 06/26/20 1500 06/26/20 1505  BP: (!) 153/75 (!) 154/70 (!) 157/72   Pulse: 69 73 74   Resp: (!) 23 (!) 23 (!) 24   Temp:      TempSrc:      SpO2: 93% 93% 93% 93%  Weight:      Height:        Intake/Output Summary (Last 24 hours) at 06/26/2020 1655 Last data filed at 06/26/2020 1500 Gross per 24 hour  Intake 4121.61 ml  Output 725 ml  Net 3396.61 ml   Filed Weights   06/25/20 0245 06/25/20  0438 06/26/20 0500  Weight: 82.2 kg 82.6 kg 84.8 kg    Examination:  General exam: Pt sedated, intubated on vent,   HEENT-ET and OG tubes Respiratory system: Fair bilateral air movement, no wheezing Cardiovascular system: normal S1 & S2 heard.  Prior sternotomy scar Gastrointestinal system: Abdomen is nondistended, soft . Normal bowel sounds heard. Central nervous system: sedated on vent. No gross focal neurological deficits. Extremities: no cyanosis seen. Skin: No gross lesions seen.  Psychiatry: unable to assess.  MSK--right arm PICC line GU-Foley cath in situ with clear urine  Data Reviewed: I have personally reviewed following labs and imaging studies  CBC: Recent Labs  Lab 07/08/2020 1849 06/22/20 0610 06/23/20 0657 06/24/20 0637 06/25/20 0441 06/26/20 0455  WBC 5.3 11.4* 12.0* 8.3 6.7 7.9  NEUTROABS 4.1  --  9.6* 6.6 6.2 6.4  HGB 14.3 11.8* 10.5* 10.2* 10.7* 10.1*  HCT 42.8 37.1* 33.3* 33.4* 35.5* 32.9*  MCV 112.3* 111.1* 106.4* 109.2* 111.3* 111.5*  PLT 152 144* 130* 110* 100* 121*    Basic Metabolic Panel: Recent Labs  Lab 06/22/20 0610 06/22/20 1955 06/23/20 0657 06/23/20 2121 06/24/20 0637 06/25/20 0441 06/26/20 0455  NA 141  --  138  --  140 139 138  K 4.0  --  3.0*  --  4.6 5.3* 5.1  CL 79*  --  89*  --  97* 98 98  CO2 43*  --  38*  --  35* 31 30  GLUCOSE 104*  --  173*  --  154* 237* 298*  BUN  28*  --  28*  --  26* 30* 55*  CREATININE 1.53*  --  1.10  --  1.03 1.06 1.56*  CALCIUM 8.7*  --  7.2*  --  7.1* 7.1* 6.9*  MG  --   --  1.6*  --  2.1 2.3 2.4  PHOS  --  2.0* 2.9 2.4*  --   --   --     GFR: Estimated Creatinine Clearance: 50 mL/min (A) (by C-G formula based on SCr of 1.56 mg/dL (H)).  Liver Function Tests: Recent Labs  Lab 07/02/2020 1849 06/23/20 0657 06/24/20 0637 06/25/20 0441 06/26/20 0455  AST _0 48*  ALT _1 35  ALKPHOS 69 50 75 83 74  BILITOT 1.0 1.3* 0.8 0.9 0.7  PROT 8.6* 5.9* 6.2* 6.7 6.3*  ALBUMIN 4.3  2.7* 2.7* 2.6* 2.5*    CBG: Recent Labs  Lab 06/26/20 0133 06/26/20 0501 06/26/20 0742 06/26/20 1118 06/26/20 1630  GLUCAP 283* 239* 295* 282* 188*    Recent Results (from the past 240 hour(s))  Resp Panel by RT-PCR (Flu A&B, Covid) Nasopharyngeal Swab     Status: None   Collection Time: 07/15/2020  7:04 PM   Specimen: Nasopharyngeal Swab; Nasopharyngeal(NP) swabs in vial transport medium  Result Value Ref Range Status   SARS Coronavirus 2 by RT PCR NEGATIVE NEGATIVE Final    Comment: (NOTE) SARS-CoV-2 target nucleic acids are NOT DETECTED.  The SARS-CoV-2 RNA is generally detectable in upper respiratory specimens during the acute phase of infection. The lowest concentration of SARS-CoV-2 viral copies this assay can detect is 138 copies/mL. A negative result does not preclude SARS-Cov-2 infection and should not be used as the sole basis for treatment or other patient management decisions. A negative result may occur with  improper specimen collection/handling, submission of specimen other than nasopharyngeal swab, presence of viral mutation(s) within the areas targeted by this assay, and inadequate number of viral copies(<138 copies/mL). A negative result must be combined with clinical observations, patient history, and epidemiological information. The expected result is Negative.  Fact Sheet for Patients:  EntrepreneurPulse.com.au  Fact Sheet for Healthcare Providers:  IncredibleEmployment.be  This test is no t yet approved or cleared by the [place] FDA and  has been authorized for detection and/or diagnosis of SARS-CoV-2 by FDA under an Emergency Use Authorization (EUA). This EUA will remain  in effect (meaning this test can be used) for the duration of the COVID-19 declaration under Section 564(b)(1) of the Act, 21 U.S.C.section 360bbb-3(b)(1), unless the authorization is terminated  or revoked sooner.       Influenza A by  PCR NEGATIVE NEGATIVE Final   Influenza B by PCR NEGATIVE NEGATIVE Final    Comment: (NOTE) The Xpert Xpress SARS-CoV-2/FLU/RSV plus assay is intended as an aid in the diagnosis of influenza from Nasopharyngeal swab specimens and should not be used as a sole basis for treatment. Nasal washings and aspirates are unacceptable for Xpert Xpress SARS-CoV-2/FLU/RSV testing.  Fact Sheet for Patients: EntrepreneurPulse.com.au  Fact Sheet for Healthcare Providers: IncredibleEmployment.be  This test is not yet approved or cleared by the [place] FDA and has been authorized for detection and/or diagnosis of SARS-CoV-2 by FDA under an Emergency Use Authorization (EUA). This EUA will remain in effect (meaning this test can be used) for the duration of the COVID-19 declaration under Section 564(b)(1) of the Act, 21 U.S.C. section 360bbb-3(b)(1), unless the authorization is terminated or revoked.  Performed at [facility]  [facility], [address] [place], [place] [address]   Culture, blood (single)     Status: None   Collection Time: 07/06/2020  8:28 PM   Specimen: BLOOD RIGHT HAND  Result Value Ref Range Status   Specimen Description BLOOD RIGHT HAND  Final   Special Requests   Final    BOTTLES DRAWN AEROBIC AND ANAEROBIC Blood Culture adequate volume   Culture   Final    NO GROWTH 5 DAYS Performed at [facility], [address], [place], [place] [address]    Report Status 06/26/2020 FINAL  Final  MRSA PCR Screening     Status: None   Collection Time: 06/22/20 12:29 AM   Specimen: Nasal Mucosa; Nasopharyngeal  Result Value Ref Range Status   MRSA by PCR NEGATIVE NEGATIVE Final    Comment:        The GeneXpert MRSA Assay (FDA approved for NASAL specimens only), is one component of a comprehensive MRSA colonization surveillance program. It is not intended to diagnose MRSA infection nor to guide or monitor treatment for MRSA infections. Performed at  [facility], [address], [place], [place] [address]   Culture, blood (routine x 2)     Status: None (Preliminary result)   Collection Time: 06/22/20  6:00 AM   Specimen: Left Antecubital; Blood  Result Value Ref Range Status   Specimen Description LEFT ANTECUBITAL  Final   Special Requests   Final    BOTTLES DRAWN AEROBIC AND ANAEROBIC Blood Culture adequate volume   Culture   Final    NO GROWTH 4 DAYS Performed at [facility], [address] [place], [place] [address]    Report Status PENDING  Incomplete  Culture, blood (routine x 2)     Status: None (Preliminary result)   Collection Time: 06/22/20  6:09 AM   Specimen: BLOOD LEFT HAND  Result Value Ref Range Status   Specimen Description BLOOD LEFT HAND  Final   Special Requests   Final    BOTTLES DRAWN AEROBIC AND ANAEROBIC Blood Culture adequate volume   Culture   Final    NO GROWTH 4 DAYS Performed at [facility], [address] [place], [place] [address]    Report Status PENDING  Incomplete  Culture, Respiratory w Gram Stain     Status: None   Collection Time: 06/22/20  8:42 AM   Specimen: Tracheal Aspirate; Respiratory  Result Value Ref Range Status   Specimen Description   Final    TRACHEAL ASPIRATE Performed at [facility], [address] [place], [place] [address]    Special Requests   Final    Normal Performed at [facility], [address], [place], [place] [address]    Gram Stain   Final    RARE WBC PRESENT,BOTH PMN AND MONONUCLEAR FEW GRAM POSITIVE RODS FEW GRAM POSITIVE COCCI IN PAIRS IN CLUSTERS RARE GRAM NEGATIVE RODS Performed at [facility], [place] [address] [place], [place] [address]    Culture RARE PSEUDOMONAS AERUGINOSA  Final   Report Status 06/25/2020 FINAL  Final   Organism ID, Bacteria PSEUDOMONAS AERUGINOSA  Final      Susceptibility   Pseudomonas aeruginosa - MIC*    CEFTAZIDIME 4 SENSITIVE Sensitive     CIPROFLOXACIN <=0.25 SENSITIVE Sensitive     GENTAMICIN 4 SENSITIVE  Sensitive     IMIPENEM 2 SENSITIVE Sensitive     PIP/TAZO 8 SENSITIVE Sensitive     CEFEPIME 2 SENSITIVE Sensitive     *  RARE PSEUDOMONAS AERUGINOSA     Radiology Studies: No results found.  Scheduled Meds: . chlorhexidine gluconate (MEDLINE KIT)  15 mL Mouth Rinse BID  . Chlorhexidine Gluconate Cloth  6 each Topical Daily  . docusate  100 mg Per Tube BID  . enoxaparin (LOVENOX) injection  40 mg Subcutaneous Q24H  . free water  100 mL Per Tube Q4H  . insulin aspart  0-20 Units Subcutaneous TID WC  . insulin aspart  0-5 Units Subcutaneous QHS  . insulin glargine  10 Units Subcutaneous Daily  . levothyroxine  50 mcg Per Tube Daily  . mouth rinse  15 mL Mouth Rinse 10 times per day  . methylPREDNISolone (SOLU-MEDROL) injection  80 mg Intravenous Q12H  . pantoprazole (PROTONIX) IV  40 mg Intravenous Q12H  . polyethylene glycol  17 g Per Tube Daily  . sodium chloride flush  10-40 mL Intracatheter Q12H   Continuous Infusions: . sodium chloride Stopped (06/23/20 1400)  . sodium chloride 50 mL/hr at 06/26/20 1316  . ceFEPime (MAXIPIME) IV 2 g (06/26/20 0842)  . feeding supplement (VITAL AF 1.2 CAL) 1,000 mL (06/26/20 1553)  . norepinephrine (LEVOPHED) Adult infusion Stopped (06/24/20 1325)  . propofol (DIPRIVAN) infusion 15 mcg/kg/min (06/26/20 1214)     LOS: 5 days   Jay Hockey, MD How to contact the [facility] Attending or Consulting provider [place] or covering provider during after hours [place], for this patient?  1. Check the care team in [facility] and look for a) attending/consulting TRH provider listed and b) the [facility] team listed 2. Log into [email or web address] and use [place]'s universal password to access. If you do not have the password, please contact the [facility] operator. 3. Locate the [facility] provider you are looking for under [facility] and page to a number that you can be directly reached. 4. If you still have difficulty reaching the provider, please page the [facility] (Director  on Call) for the Hospitalists listed on amion for assistance.  06/26/2020, 4:55 PM

## 2020-06-27 ENCOUNTER — Inpatient Hospital Stay (HOSPITAL_COMMUNITY): Payer: Medicare Other

## 2020-06-27 DIAGNOSIS — J9602 Acute respiratory failure with hypercapnia: Secondary | ICD-10-CM | POA: Diagnosis not present

## 2020-06-27 DIAGNOSIS — J9601 Acute respiratory failure with hypoxia: Secondary | ICD-10-CM | POA: Diagnosis not present

## 2020-06-27 LAB — COMPREHENSIVE METABOLIC PANEL
ALT: 44 U/L (ref 0–44)
AST: 41 U/L (ref 15–41)
Albumin: 2.4 g/dL — ABNORMAL LOW (ref 3.5–5.0)
Alkaline Phosphatase: 70 U/L (ref 38–126)
Anion gap: 13 (ref 5–15)
BUN: 78 mg/dL — ABNORMAL HIGH (ref 6–20)
CO2: 28 mmol/L (ref 22–32)
Calcium: 7.2 mg/dL — ABNORMAL LOW (ref 8.9–10.3)
Chloride: 98 mmol/L (ref 98–111)
Creatinine, Ser: 1.81 mg/dL — ABNORMAL HIGH (ref 0.61–1.24)
GFR, Estimated: 43 mL/min — ABNORMAL LOW (ref >60)
Glucose, Bld: 307 mg/dL — ABNORMAL HIGH (ref 70–99)
Potassium: 5.4 mmol/L — ABNORMAL HIGH (ref 3.5–5.1)
Sodium: 139 mmol/L (ref 135–145)
Total Bilirubin: 0.7 mg/dL (ref 0.3–1.2)
Total Protein: 6.1 g/dL — ABNORMAL LOW (ref 6.5–8.1)

## 2020-06-27 LAB — CBC WITH DIFFERENTIAL/PLATELET
Abs Immature Granulocytes: 0.4 10*3/uL — ABNORMAL HIGH (ref 0.00–0.07)
Basophils Absolute: 0.1 10*3/uL (ref 0.0–0.1)
Basophils Relative: 1 %
Eosinophils Absolute: 0 10*3/uL (ref 0.0–0.5)
Eosinophils Relative: 0 %
HCT: 34 % — ABNORMAL LOW (ref 39.0–52.0)
Hemoglobin: 10.3 g/dL — ABNORMAL LOW (ref 13.0–17.0)
Immature Granulocytes: 5 %
Lymphocytes Relative: 3 %
Lymphs Abs: 0.2 10*3/uL — ABNORMAL LOW (ref 0.7–4.0)
MCH: 33.3 pg (ref 26.0–34.0)
MCHC: 30.3 g/dL (ref 30.0–36.0)
MCV: 110 fL — ABNORMAL HIGH (ref 80.0–100.0)
Monocytes Absolute: 0.7 10*3/uL (ref 0.1–1.0)
Monocytes Relative: 8 %
Neutro Abs: 6.6 10*3/uL (ref 1.7–7.7)
Neutrophils Relative %: 83 %
Platelets: 111 10*3/uL — ABNORMAL LOW (ref 150–400)
RBC: 3.09 MIL/uL — ABNORMAL LOW (ref 4.22–5.81)
RDW: 14.5 % (ref 11.5–15.5)
WBC: 7.9 10*3/uL (ref 4.0–10.5)
nRBC: 2.8 % — ABNORMAL HIGH (ref 0.0–0.2)

## 2020-06-27 LAB — GLUCOSE, CAPILLARY
Glucose-Capillary: 240 mg/dL — ABNORMAL HIGH (ref 70–99)
Glucose-Capillary: 241 mg/dL — ABNORMAL HIGH (ref 70–99)
Glucose-Capillary: 260 mg/dL — ABNORMAL HIGH (ref 70–99)
Glucose-Capillary: 279 mg/dL — ABNORMAL HIGH (ref 70–99)
Glucose-Capillary: 292 mg/dL — ABNORMAL HIGH (ref 70–99)
Glucose-Capillary: 304 mg/dL — ABNORMAL HIGH (ref 70–99)
Glucose-Capillary: 324 mg/dL — ABNORMAL HIGH (ref 70–99)

## 2020-06-27 LAB — MAGNESIUM: Magnesium: 2.7 mg/dL — ABNORMAL HIGH (ref 1.7–2.4)

## 2020-06-27 MED ORDER — METHYLPREDNISOLONE SODIUM SUCC 40 MG IJ SOLR
40.0000 mg | Freq: Three times a day (TID) | INTRAMUSCULAR | Status: DC
  Administered 2020-06-27 – 2020-06-28 (×3): 40 mg via INTRAVENOUS
  Filled 2020-06-27 (×3): qty 1

## 2020-06-27 MED ORDER — SODIUM ZIRCONIUM CYCLOSILICATE 10 G PO PACK
10.0000 g | PACK | Freq: Once | ORAL | Status: AC
  Administered 2020-06-27: 10 g
  Filled 2020-06-27: qty 1

## 2020-06-27 MED ORDER — BISACODYL 10 MG RE SUPP
10.0000 mg | Freq: Once | RECTAL | Status: AC
  Administered 2020-06-27: 10 mg via RECTAL
  Filled 2020-06-27: qty 1

## 2020-06-27 MED ORDER — SODIUM CHLORIDE 0.9 % IV SOLN
2.0000 g | Freq: Two times a day (BID) | INTRAVENOUS | Status: DC
  Administered 2020-06-27 – 2020-06-30 (×7): 2 g via INTRAVENOUS
  Filled 2020-06-27 (×7): qty 2

## 2020-06-27 NOTE — Progress Notes (Signed)
PROGRESS NOTE   Jay Skinner  SVO:[id number] DOB: 1960-12-04 DOA: 07/03/2020 PCP: Assunta Found, MD   Chief Complaint  Patient presents with  . Shortness of Breath   Level of care: ICU  Brief Admission History:  60 y.o. male with medical history significant for completely repaired tetralogy of Fallot , persistent atrial fibrillation , diabetes mellitus, GI bleed, hypertension, chronic respiratory failure on 2 L, paralyzed right diaphragm. History is obtained from chart review as at the time of evaluation patient is intubated.  Pt was brought to the ED via EMS.  Patient was recently discharged from [facility] after admission 3/24-3/27 for GI bleed.  Since discharge to home, patient has steadily declined.  Patient's brother found patient sitting in his chair, sleeping and drooling.  At baseline he is alert and conversant.  Family also reported that patient has had a cough over the past several days.  He was admitted with circulatory shock likely sepsis, pneumonia, acute respiratory failure.    Assessment & Plan:   Principal Problem:   Acute respiratory failure with hypoxia and hypercapnia (HCC) Active Problems:   Gastrointestinal hemorrhage associated with gastric ulcer   HTN (hypertension)   Persistent atrial fibrillation (HCC)   Tetralogy of Fallot   Type 2 diabetes mellitus (HCC)   Shock circulatory (HCC)   Metabolic alkalosis with respiratory acidosis   1)Severe sepsis with septic shock secondary to community-acquired pneumonia --Patient met sepsis criteria with fevers, tachypnea, leukocytosis and acute hypoxic respiratory failure with persistent hypotension as well -Patient has been weaned off Levophed as of 06/24/2020 -Elevated PCT noted -Leukocytosis resolved on 06/24/2020 (leukocytosis may realcohol with current steroid therapy) -Continue IV cefepime   IV vancomycin discontinued on 06/26/2018 -Overall sepsis pathophysiology appears to be resolving Antimicrobials this  admission: Vancomycin 4/5 >> 4/8 Cefepime 4/5 >>   Microbiology results: 4/5BCx: ngtd 4/5tracheal aspirate Cx: pseudomonas MRSA PCR: neg  2)Acute respiratory failure with hypoxia and hypercapnia---cannot rule out amiodarone induced lung toxicity-- -Remains intubated and sedated -PCCM consult noted -Discussed with Dr. Sherene Sires -ESR is 107--- cannot rule out amiodarone induced lung toxicity -PTA patient was on amiodarone 400 mg daily, last dose was 06/24/2020 -Tracheal aspirate from 06/22/20 with Pseudomonas--currently on cefepime -started IV solu-medrol on 06/24/2020 --May attempt extubation on 06/28/2020   3)Right pleural Effusion/possible pulmonary edema -Right-sided ultrasound does not demonstrate a pocket that is big enough to allow for safe thoracentesis   4)Persistent atrial fibrillation  -Stopped amiodarone on 06/24/2020-due to concerns about possible amiodarone toxicity as noted above - his apixaban had been on hold temporarily for recent GI bleeding  5)Chronic diastolic right sided heart failure - Hold bumex and spironolactone temporarily due to sepsis pathophysiology and aki - Follow I/O, weights, electrolytes  6)History of recent GI bleeding - IV protonix 40 mg BID for GI protection ordered  7)Type 2 DM  -Anticipate worsening glycemic control with initiation of IV steroids for amiodarone toxicity -Started Lantus 10 units daily Use Novolog/Humalog Sliding scale insulin with Accu-Cheks/Fingersticks as ordered   8)Repaired Tetralogy of Fallot - Follow[facility] Duard [facility] at [facility]  9)FEN/hyperkalemia-- -Tolerating tube feed and water flushes well via OG tube, Lokelma given  10)Hypothyroidism--continue levothyroxine  11)AKI----acute kidney injury - creatinine 1.8 from 1.0--suspect due to diuresis -Urine output is improving -Diuretics were discontinued renally adjust medications, avoid nephrotoxic agents / dehydration  / hypotension  12)Social/Ethics--palliative care  consult appreciated patient is a DNR, with full scope of treatment  -Prophylaxis-Protonix for GI prophylaxis  Lovenox/SCDs for DVT prophylaxis  CRITICAL CARE Performed by: Roxan Hockey   Total critical care time: 43 minutes  Critical care time was exclusive of separately billable procedures and treating other patients.  Critical care was necessary to treat or prevent imminent or life-threatening deterioration. -Remains critically ill, intubated and sedated Vent Settings:- PRVC/40%/5/15/470 -Off Levophed  Critical care was time spent personally by me on the following activities: development of treatment plan with patient and/or surrogate as well as nursing, discussions with consultants, evaluation of patient's response to treatment, examination of patient, obtaining history from patient or surrogate, ordering and performing treatments and interventions, ordering and review of laboratory studies, ordering and review of radiographic studies, pulse oximetry and re-evaluation of patient's condition.   DVT prophylaxis: SCD/Lovenox prophylactic dose Code Status: Full  Family Communication: Discussed with brothers Disposition: TBD Status is: Inpatient  Remains inpatient appropriate because:-Remains critically ill,  intubated and sedated   Dispo: The patient is from: Home              Anticipated d/c is to: TBD              Patient currently is not medically stable to d/c.   Difficult to place patient No  Consultants:   PCCM critical care  Procedures:     Antimicrobials:  Cefepime 4/5>> Vancomycin 4/5>>   Subjective: -Remains critically ill, off IV Levophed, intubated and sedated Vent Settings:- PRVC/40%/5/15/470  - 06/27/20 -Weaning ok, urine output is improving -May attempt extubation on 06/28/2020 -Had BM with Dulcolax suppository -Tolerating tube feed and water flushes well  Objective: Vitals:   06/27/20 1500 06/27/20 1600 06/27/20 1653 06/27/20 1719  BP:  (!) 172/69 (!) 167/66    Pulse: 71 68  67  Resp: (!) 23 (!) 26  (!) 37  Temp:    99.5 F (37.5 C)  TempSrc:    Axillary  SpO2: 93% 93% 97% 96%  Weight:      Height:        Intake/Output Summary (Last 24 hours) at 06/27/2020 1820 Last data filed at 06/27/2020 1600 Gross per 24 hour  Intake 3123.38 ml  Output 1400 ml  Net 1723.38 ml   Filed Weights   06/25/20 0438 06/26/20 0500 06/27/20 0500  Weight: 82.6 kg 84.8 kg 84.4 kg    Examination:  General exam: Pt sedated, intubated on vent,   HEENT-ET and OG tubes Respiratory system: Fair bilateral air movement, no wheezing Cardiovascular system: normal S1 & S2 heard.  Prior sternotomy scar Gastrointestinal system: Abdomen is nondistended, soft . Normal bowel sounds heard. Central nervous system: sedated on vent. No gross focal neurological deficits. Extremities: no cyanosis seen. Skin: No gross lesions seen.  Psychiatry: unable to assess.  MSK--right arm PICC line GU-Foley cath in situ with clear urine  Data Reviewed: I have personally reviewed following labs and imaging studies  CBC: Recent Labs  Lab 06/23/20 0657 06/24/20 0637 06/25/20 0441 06/26/20 0455 06/27/20 0608  WBC 12.0* 8.3 6.7 7.9 7.9  NEUTROABS 9.6* 6.6 6.2 6.4 6.6  HGB 10.5* 10.2* 10.7* 10.1* 10.3*  HCT 33.3* 33.4* 35.5* 32.9* 34.0*  MCV 106.4* 109.2* 111.3* 111.5* 110.0*  PLT 130* 110* 100* 121* 111*    Basic Metabolic Panel: Recent Labs  Lab 06/22/20 1955 06/23/20 0657 06/23/20 2121 06/24/20 0637 06/25/20 0441 06/26/20 0455 06/27/20 0608  NA  --  138  --  140 139 138 139  K  --  3.0*  --  4.6 5.3* 5.1 5.4*  CL  --  89*  --  97* 98 98 98  CO2  --  38*  --  35* $Re'31 30 28  'xYB$ GLUCOSE  --  173*  --  154* 237* 298* 307*  BUN  --  28*  --  26* 30* 55* 78*  CREATININE  --  1.10  --  1.03 1.06 1.56* 1.81*  CALCIUM  --  7.2*  --  7.1* 7.1* 6.9* 7.2*  MG  --  1.6*  --  2.1 2.3 2.4 2.7*  PHOS 2.0* 2.9 2.4*  --   --   --   --     GFR: Estimated  Creatinine Clearance: 43.1 mL/min (A) (by C-G formula based on SCr of 1.81 mg/dL (H)).  Liver Function Tests: Recent Labs  Lab 06/23/20 0657 06/24/20 6568 06/25/20 0441 06/26/20 0455 06/27/20 0608  AST $Re'19 17 16 'kCb$ 48* 41  ALT $Re'9 9 11 'keQ$ 35 44  ALKPHOS 50 75 83 74 70  BILITOT 1.3* 0.8 0.9 0.7 0.7  PROT 5.9* 6.2* 6.7 6.3* 6.1*  ALBUMIN 2.7* 2.7* 2.6* 2.5* 2.4*    CBG: Recent Labs  Lab 06/27/20 0021 06/27/20 0424 06/27/20 0725 06/27/20 1131 06/27/20 1718  GLUCAP 240* 292* 304* 324* 241*    Recent Results (from the past 240 hour(s))  Resp Panel by RT-PCR (Flu A&B, Covid) Nasopharyngeal Swab     Status: None   Collection Time: 07/16/2020  7:04 PM   Specimen: Nasopharyngeal Swab; Nasopharyngeal(NP) swabs in vial transport medium  Result Value Ref Range Status   SARS Coronavirus 2 by RT PCR NEGATIVE NEGATIVE Final    Comment: (NOTE) SARS-CoV-2 target nucleic acids are NOT DETECTED.  The SARS-CoV-2 RNA is generally detectable in upper respiratory specimens during the acute phase of infection. The lowest concentration of SARS-CoV-2 viral copies this assay can detect is 138 copies/mL. A negative result does not preclude SARS-Cov-2 infection and should not be used as the sole basis for treatment or other patient management decisions. A negative result may occur with  improper specimen collection/handling, submission of specimen other than nasopharyngeal swab, presence of viral mutation(s) within the areas targeted by this assay, and inadequate number of viral copies(<138 copies/mL). A negative result must be combined with clinical observations, patient history, and epidemiological information. The expected result is Negative.  Fact Sheet for Patients:  EntrepreneurPulse.com.au  Fact Sheet for Healthcare Providers:  IncredibleEmployment.be  This test is no t yet approved or cleared by the [place] FDA and  has been authorized for detection  and/or diagnosis of SARS-CoV-2 by FDA under an Emergency Use Authorization (EUA). This EUA will remain  in effect (meaning this test can be used) for the duration of the COVID-19 declaration under Section 564(b)(1) of the Act, 21 U.S.C.section 360bbb-3(b)(1), unless the authorization is terminated  or revoked sooner.       Influenza A by PCR NEGATIVE NEGATIVE Final   Influenza B by PCR NEGATIVE NEGATIVE Final    Comment: (NOTE) The Xpert Xpress SARS-CoV-2/FLU/RSV plus assay is intended as an aid in the diagnosis of influenza from Nasopharyngeal swab specimens and should not be used as a sole basis for treatment. Nasal washings and aspirates are unacceptable for Xpert Xpress SARS-CoV-2/FLU/RSV testing.  Fact Sheet for Patients: EntrepreneurPulse.com.au  Fact Sheet for Healthcare Providers: IncredibleEmployment.be  This test is not yet approved or cleared by the [place] FDA and has been authorized for detection and/or diagnosis of SARS-CoV-2 by FDA under an Emergency Use Authorization (EUA). This EUA will remain in effect (meaning this test can  be used) for the duration of the COVID-19 declaration under Section 564(b)(1) of the Act, 21 U.S.C. section 360bbb-3(b)(1), unless the authorization is terminated or revoked.  Performed at [facility], [address], [place], [place] [address]   Culture, blood (single)     Status: None   Collection Time: 06/25/2020  8:28 PM   Specimen: BLOOD RIGHT HAND  Result Value Ref Range Status   Specimen Description BLOOD RIGHT HAND  Final   Special Requests   Final    BOTTLES DRAWN AEROBIC AND ANAEROBIC Blood Culture adequate volume   Culture   Final    NO GROWTH 5 DAYS Performed at [facility], [address], [place], [place] [address]    Report Status 06/26/2020 FINAL  Final  MRSA PCR Screening     Status: None   Collection Time: 06/22/20 12:29 AM   Specimen: Nasal Mucosa; Nasopharyngeal  Result  Value Ref Range Status   MRSA by PCR NEGATIVE NEGATIVE Final    Comment:        The GeneXpert MRSA Assay (FDA approved for NASAL specimens only), is one component of a comprehensive MRSA colonization surveillance program. It is not intended to diagnose MRSA infection nor to guide or monitor treatment for MRSA infections. Performed at [facility], [address] [place], [place] [address]   Culture, blood (routine x 2)     Status: None (Preliminary result)   Collection Time: 06/22/20  6:00 AM   Specimen: Left Antecubital; Blood  Result Value Ref Range Status   Specimen Description LEFT ANTECUBITAL  Final   Special Requests   Final    BOTTLES DRAWN AEROBIC AND ANAEROBIC Blood Culture adequate volume   Culture   Final    NO GROWTH 4 DAYS Performed at [facility], [address], [place], [place] [address]    Report Status PENDING  Incomplete  Culture, blood (routine x 2)     Status: None (Preliminary result)   Collection Time: 06/22/20  6:09 AM   Specimen: BLOOD LEFT HAND  Result Value Ref Range Status   Specimen Description BLOOD LEFT HAND  Final   Special Requests   Final    BOTTLES DRAWN AEROBIC AND ANAEROBIC Blood Culture adequate volume   Culture   Final    NO GROWTH 4 DAYS Performed at [facility], [address] [place], [place] [address]    Report Status PENDING  Incomplete  Culture, Respiratory w Gram Stain     Status: None   Collection Time: 06/22/20  8:42 AM   Specimen: Tracheal Aspirate; Respiratory  Result Value Ref Range Status   Specimen Description   Final    TRACHEAL ASPIRATE Performed at [facility], [address] [place], [place] [address]    Special Requests   Final    Normal Performed at [facility], [address], [place], [place] [address]    Gram Stain   Final    RARE WBC PRESENT,BOTH PMN AND MONONUCLEAR FEW GRAM POSITIVE RODS FEW GRAM POSITIVE COCCI IN PAIRS IN CLUSTERS RARE GRAM NEGATIVE RODS Performed at [facility], [place] [address], [place], [place] [address]    Culture RARE PSEUDOMONAS AERUGINOSA  Final   Report Status 06/25/2020 FINAL  Final   Organism ID, Bacteria PSEUDOMONAS AERUGINOSA  Final      Susceptibility   Pseudomonas aeruginosa - MIC*    CEFTAZIDIME 4 SENSITIVE Sensitive     CIPROFLOXACIN <=0.25 SENSITIVE Sensitive     GENTAMICIN 4 SENSITIVE  Sensitive     IMIPENEM 2 SENSITIVE Sensitive     PIP/TAZO 8 SENSITIVE Sensitive     CEFEPIME 2 SENSITIVE Sensitive     * RARE PSEUDOMONAS AERUGINOSA     Radiology Studies: DG CHEST PORT 1 VIEW  Result Date: 06/27/2020 CLINICAL DATA:  Hypoxia EXAM: PORTABLE CHEST 1 VIEW COMPARISON:  June 24, 2020 FINDINGS: Endotracheal tube tip is 4.3 cm above the carina. Nasogastric tube tip and side port are below the diaphragm. Central catheter tip is in the right atrium, stable. No pneumothorax. There is a right pleural effusion with airspace opacity in the right mid and lower lung regions. There is left base atelectasis. There is cardiomegaly with pulmonary venous hypertension. Prior median sternotomy with apparent cardiac valve replacement. There is aortic atherosclerosis. No bone lesions. IMPRESSION: Tube and catheter positions as described without pneumothorax. Persistent cardiomegaly with pulmonary vascular congestion. Right pleural effusion with airspace opacity in the right mid and lower lung regions, likely due to combination of atelectasis and potential pneumonia. A degree of pulmonary edema in this area is possible as well. Postoperative changes.  Aortic Atherosclerosis (ICD10-I70.0). Electronically Signed   By: Lowella Grip III M.D.   On: 06/27/2020 08:14    Scheduled Meds: . chlorhexidine gluconate (MEDLINE KIT)  15 mL Mouth Rinse BID  . Chlorhexidine Gluconate Cloth  6 each Topical Daily  . docusate  100 mg Per Tube BID  . enoxaparin (LOVENOX) injection  40 mg Subcutaneous Q24H  . free water  100 mL Per Tube Q4H  . insulin aspart  0-20 Units Subcutaneous  TID WC  . insulin aspart  0-5 Units Subcutaneous QHS  . insulin glargine  10 Units Subcutaneous Daily  . levothyroxine  50 mcg Per Tube Daily  . mouth rinse  15 mL Mouth Rinse 10 times per day  . methylPREDNISolone (SOLU-MEDROL) injection  40 mg Intravenous Q8H  . pantoprazole (PROTONIX) IV  40 mg Intravenous Q12H  . polyethylene glycol  17 g Per Tube Daily  . sodium chloride flush  10-40 mL Intracatheter Q12H   Continuous Infusions: . sodium chloride Stopped (06/23/20 1400)  . sodium chloride 50 mL/hr at 06/27/20 0830  . ceFEPime (MAXIPIME) IV    . feeding supplement (VITAL AF 1.2 CAL) 60 mL/hr at 06/26/20 2200  . propofol (DIPRIVAN) infusion 25 mcg/kg/min (06/27/20 0830)     LOS: 6 days   Roxan Hockey, MD How to contact the [facility] Attending or Consulting provider [place] or covering provider during after hours [place], for this patient?  1. Check the care team in [facility] and look for a) attending/consulting [facility] provider listed and b) the [facility] team listed 2. Log into [email or web address] and use [place]'s universal password to access. If you do not have the password, please contact the hospital operator. 3. Locate the [facility] provider you are looking for under [facility] and page to a number that you can be directly reached. 4. If you still have difficulty reaching the provider, please page the [facility] (Director on Call) for the Hospitalists listed on amion for assistance.  06/27/2020, 6:20 PM

## 2020-06-28 ENCOUNTER — Inpatient Hospital Stay (HOSPITAL_COMMUNITY): Payer: Medicare Other

## 2020-06-28 DIAGNOSIS — J9811 Atelectasis: Secondary | ICD-10-CM | POA: Diagnosis not present

## 2020-06-28 DIAGNOSIS — Z515 Encounter for palliative care: Secondary | ICD-10-CM | POA: Diagnosis not present

## 2020-06-28 DIAGNOSIS — Z7189 Other specified counseling: Secondary | ICD-10-CM | POA: Diagnosis not present

## 2020-06-28 DIAGNOSIS — J9602 Acute respiratory failure with hypercapnia: Secondary | ICD-10-CM | POA: Diagnosis not present

## 2020-06-28 DIAGNOSIS — N179 Acute kidney failure, unspecified: Secondary | ICD-10-CM

## 2020-06-28 DIAGNOSIS — J9601 Acute respiratory failure with hypoxia: Secondary | ICD-10-CM | POA: Diagnosis not present

## 2020-06-28 LAB — RENAL FUNCTION PANEL
Albumin: 2.5 g/dL — ABNORMAL LOW (ref 3.5–5.0)
Anion gap: 12 (ref 5–15)
BUN: 95 mg/dL — ABNORMAL HIGH (ref 6–20)
CO2: 26 mmol/L (ref 22–32)
Calcium: 7.1 mg/dL — ABNORMAL LOW (ref 8.9–10.3)
Chloride: 99 mmol/L (ref 98–111)
Creatinine, Ser: 1.75 mg/dL — ABNORMAL HIGH (ref 0.61–1.24)
GFR, Estimated: 44 mL/min — ABNORMAL LOW (ref >60)
Glucose, Bld: 324 mg/dL — ABNORMAL HIGH (ref 70–99)
Phosphorus: 3.3 mg/dL (ref 2.5–4.6)
Potassium: 4.8 mmol/L (ref 3.5–5.1)
Sodium: 137 mmol/L (ref 135–145)

## 2020-06-28 LAB — CBC
HCT: 33.5 % — ABNORMAL LOW (ref 39.0–52.0)
Hemoglobin: 10.4 g/dL — ABNORMAL LOW (ref 13.0–17.0)
MCH: 33.3 pg (ref 26.0–34.0)
MCHC: 31 g/dL (ref 30.0–36.0)
MCV: 107.4 fL — ABNORMAL HIGH (ref 80.0–100.0)
Platelets: 112 10*3/uL — ABNORMAL LOW (ref 150–400)
RBC: 3.12 MIL/uL — ABNORMAL LOW (ref 4.22–5.81)
RDW: 14.6 % (ref 11.5–15.5)
WBC: 7.5 10*3/uL (ref 4.0–10.5)
nRBC: 4 % — ABNORMAL HIGH (ref 0.0–0.2)

## 2020-06-28 LAB — GLUCOSE, CAPILLARY
Glucose-Capillary: 281 mg/dL — ABNORMAL HIGH (ref 70–99)
Glucose-Capillary: 299 mg/dL — ABNORMAL HIGH (ref 70–99)
Glucose-Capillary: 283 mg/dL — ABNORMAL HIGH (ref 70–99)
Glucose-Capillary: 266 mg/dL — ABNORMAL HIGH (ref 70–99)
Glucose-Capillary: 263 mg/dL — ABNORMAL HIGH (ref 70–99)
Glucose-Capillary: 298 mg/dL — ABNORMAL HIGH (ref 70–99)

## 2020-06-28 LAB — CULTURE, BLOOD (ROUTINE X 2)
Culture: NO GROWTH
Culture: NO GROWTH
Special Requests: ADEQUATE
Special Requests: ADEQUATE

## 2020-06-28 LAB — TRIGLYCERIDES: Triglycerides: 131 mg/dL (ref <150)

## 2020-06-28 MED ORDER — METHYLPREDNISOLONE SODIUM SUCC 40 MG IJ SOLR: 40.0000 mg | Freq: Two times a day (BID) | INTRAMUSCULAR | Status: DC

## 2020-06-28 MED ORDER — METHYLPREDNISOLONE SODIUM SUCC 40 MG IJ SOLR: 40.0000 mg | Freq: Every day | INTRAMUSCULAR | Status: DC

## 2020-06-28 MED ORDER — PREDNISONE 20 MG PO TABS: 30.0000 mg | ORAL_TABLET | Freq: Every day | ORAL | Status: DC

## 2020-06-28 MED ORDER — PREDNISONE 5 MG/5ML PO SOLN: 30.0000 mg | Freq: Every day | ORAL | Status: DC

## 2020-06-28 MED ORDER — METHYLPREDNISOLONE SODIUM SUCC 40 MG IJ SOLR
40.0000 mg | Freq: Every day | INTRAMUSCULAR | Status: DC
  Administered 2020-06-29 – 2020-06-30 (×2): 40 mg via INTRAVENOUS
  Filled 2020-06-28 (×2): qty 1

## 2020-06-28 MED ORDER — DEXMEDETOMIDINE HCL IN NACL 400 MCG/100ML IV SOLN
0.4000 ug/kg/h | INTRAVENOUS | Status: DC
  Administered 2020-06-28: 0.4 ug/kg/h via INTRAVENOUS
  Filled 2020-06-28 (×2): qty 100

## 2020-06-28 NOTE — Progress Notes (Signed)
NAME:  Jay Skinner, MRN:  [id number], DOB:  11/30/1960, LOS: 7 ADMISSION DATE:  07/04/2020, CONSULTATION DATE:  4/5 REFERRING MD:  Aline August CHIEF COMPLAINT:  Acute on chronic hypoxemic/hypercarbic Resp failure  Brief patient profile:    History of Present Illness:   60 y.o. male with medical history significant for completely repaired tetralogy of Fallot , persistent atrial fibrillation , diabetes mellitus, GI bleed, hypertension, chronic respiratory failure on 2 L, paralyzed right diaphragm. History is obtained from chart review as at the time of evaluation patient is intubated.  Patient was brought to the ED via EMS.  Patient was recently discharged from [facility] after admission 3/24-3/27 for GI bleed.  Since discharge to home, patient has steadily declined.  Today patient's brother found patient sitting in his chair, sleeping and drooling.  At baseline he is alert and conversant.  Family also reported that patient has had a cough over the past several days.  Hospitalization here at [facility] 3/17 - 3/18-for acute upper GI bleed and acute gastroenteritis.  EGD was deferred for patient to follow-up with his gastroenterologist at [facility].  Hemoglobin remained stable at 11.8 during admission.  He was advised to stop taking his apixaban until he followed up with GI.  ED Course:  Heart rate 67s to 80s.  resp rate 24-32.  Blood pressure systolic 970-263.  O2 sats 91 to 97% on 6 L.  Venous blood gas showed pH of 7.1, VCO2 greater than 120.  BNP 368.  Lactic acid 1.1.  Initial portable chest x-ray showed-complete opacification of the right hemithorax, perihilar infiltration on the left.  Post intubation shunt chest x-ray shows significant improvement in   aeration of right lung.  Patient was initially tried on BiPAP and subsequently intubated. ED provider talked to critical care, okay with admission at [facility].  Pertinent  Medical History  Arthritis Asthma Heart disease sp   completely repaired tetralogy of Fallot History of bleeding ulcers Hypertension Hypothyroidism  Significant Hospital Events: Including procedures, antibiotic start and stop dates in addition to other pertinent events   . Resp viral panel 4/4 > neg covid/ flu  . BC x 1  4/4 >>>ng . ET 4/4 >> . BC x 2 4/5 >>>ng . MRSA PCR 4/5>  Neg . Resp culture/et 4/5 >>>  rare pseudomonaas . Maxepime 4/6 >>> . Vanc IV 4/6  >  4/7  EEG  4/5 >>> moderate to severe diffuse encephalopathy, nonspecific etiology. No seizures or epileptiform discharges were seen throughout the recording. Marland Kitchen [place] R chest 4/6 > not enough effusion to tap  . R PIC 4/6 >>> . ESR  4/7  = 107  > solumedrol 60 q 8 and d/c'd amiodarone   Scheduled Meds: . chlorhexidine gluconate (MEDLINE KIT)  15 mL Mouth Rinse BID  . Chlorhexidine Gluconate Cloth  6 each Topical Daily  . docusate  100 mg Per Tube BID  . enoxaparin (LOVENOX) injection  40 mg Subcutaneous Q24H  . free water  100 mL Per Tube Q4H  . insulin aspart  0-20 Units Subcutaneous TID WC  . insulin aspart  0-5 Units Subcutaneous QHS  . insulin glargine  10 Units Subcutaneous Daily  . levothyroxine  50 mcg Per Tube Daily  . mouth rinse  15 mL Mouth Rinse 10 times per day  . [START ON 06/29/2020] methylPREDNISolone (SOLU-MEDROL) injection  40 mg Intravenous Daily  . pantoprazole (PROTONIX) IV  40 mg Intravenous Q12H  . polyethylene glycol  17 g Per Tube Daily  . sodium chloride flush  10-40 mL Intracatheter Q12H   Continuous Infusions: . sodium chloride Stopped (06/23/20 1400)  . ceFEPime (MAXIPIME) IV 200 mL/hr at 06/28/20 0835  . dexmedetomidine (PRECEDEX) IV infusion 0.4 mcg/kg/hr (06/28/20 1341)  . feeding supplement (VITAL AF 1.2 CAL) Stopped (06/28/20 0900)   PRN Meds:.acetaminophen **OR** acetaminophen, albuterol, fentaNYL (SUBLIMAZE) injection, fentaNYL (SUBLIMAZE) injection, naphazoline-pheniramine, polyethylene glycol, sodium chloride flush    Interim History  / Subjective:   Remains critically ill, intubated Off propofol this morning. Weaning on pressure support 15/5 Afebrile Improving urine output  Objective   Blood pressure 127/79, pulse 88, temperature 100.3 F (37.9 C), temperature source Axillary, resp. rate (!) 0, height 5\' 4"  (1.626 m), weight 87 kg, SpO2 95 %. CVP:  [13 mmHg-38 mmHg] 19 mmHg  Vent Mode: CPAP FiO2 (%):  [40 %] 40 % Set Rate:  [14 bmp] 14 bmp Vt Set:  [470 mL] 470 mL PEEP:  [5 cmH20] 5 cmH20 Pressure Support:  [15 cmH20] 15 cmH20 Plateau Pressure:  [21 cmH20-28 cmH20] 24 cmH20   Intake/Output Summary (Last 24 hours) at 06/28/2020 1349 Last data filed at 06/28/2020 1200 Gross per 24 hour  Intake 3508.26 ml  Output 2350 ml  Net 1158.26 ml   Filed Weights   06/26/20 0500 06/27/20 0500 06/28/20 0500  Weight: 84.8 kg 84.4 kg 87 kg   CVP:  [13 mmHg-38 mmHg] 19 mmHg   Examination:  Chronically ill-appearing, no distress, off propofol No jvd Oropharynx  ET /og Neck supple Lungs decreased breath sounds bilateral, no accessory muscle use RRR no s3 or or significant  murmur Abd mod distended with decreased bs/ excursion limited  Ext warm 2+bilateral ext  edema     Chest x-ray independently reviewed -diffuse bilateral airspace disease right more than left, ET tube in position.  Labs show hyperglycemia, creatinine rising to 1.7, stable anemia, no leukocytosis, stable thrombocytopenia  Resolved Hospital Problem list      Assessment & Plan:  1) Acute on chronic hypercabic and hypoxemic Resp failure with post hypercapneic met alkalosis -Limited progress with weaning due to deconditioning, fluid, abdominal distention, right hemidiaphragm paralysis  -Continue spontaneous breathing trials on pressure support -Bronchoscopy today  for atelectasis on right    2) Prob HCAP vs asp pna on R in pt with chronically elevated RHD ? 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 - off levophed as of pm 4/7     5) AKI secondary to overdiuresis versus sepsis, 10 L positive balance Lab Results  Component Value Date   CREATININE 1.75 (H) 06/28/2020   CREATININE 1.81 (H) 06/27/2020   CREATININE 1.56 (H) 06/26/2020   -Resume Lasix once creatinine starts downtrending   Best practice (right click and "Reselect all SmartList Selections" daily)  Diet:  Tube Feed  Pain/Anxiety/Delirium protocol (if indicated): Change from propofol to Precedex, goal RASS 0 VAP protocol (if indicated): Yes DVT prophylaxis: LMWH and SCD GI prophylaxis: PPI Glucose control:  SSI No Central venous access:  Yes, and it is still needed Arterial line:  N/A Foley:  N/A Mobility:  OOB  PT consulted: N/A Last date of multidisciplinary goals of care discussion 4/11 brother 6/11 and sister, palliative care RN Dorene Sorrow present , Vitali plays the harmonica, he would never want to be in  a nursing home with a tracheostomy, no reintubation no tracheostomy Code Status:  DNR Disposition: ICU   Labs   CBC: Recent Labs  Lab 06/23/20 0657 06/24/20 0637 06/25/20 0441 06/26/20 0455 06/27/20 0608 06/28/20 0436  WBC 12.0* 8.3 6.7 7.9 7.9 7.5  NEUTROABS 9.6* 6.6 6.2 6.4 6.6  --   HGB 10.5* 10.2* 10.7* 10.1* 10.3* 10.4*  HCT 33.3* 33.4* 35.5* 32.9* 34.0* 33.5*  MCV 106.4* 109.2* 111.3* 111.5* 110.0* 107.4*  PLT 130* 110* 100* 121* 111* 112*    Basic Metabolic Panel: Recent Labs  Lab 06/22/20 1955 06/23/20 0657 06/23/20 2121 06/24/20 0637 06/25/20 0441 06/26/20 0455 06/27/20 0608 06/28/20 0437  NA  --  138  --  140 139 138 139 137  K  --  3.0*  --  4.6 5.3* 5.1 5.4* 4.8  CL  --  89*  --  97* 98 98 98 99  CO2  --  38*  --  35* $Re'31 30 28 26  'KtX$ GLUCOSE  --  173*  --  154* 237* 298* 307* 324*  BUN  --  28*  --  26* 30* 55* 78* 95*   CREATININE  --  1.10  --  1.03 1.06 1.56* 1.81* 1.75*  CALCIUM  --  7.2*  --  7.1* 7.1* 6.9* 7.2* 7.1*  MG  --  1.6*  --  2.1 2.3 2.4 2.7*  --   PHOS 2.0* 2.9 2.4*  --   --   --   --  3.3   GFR: Estimated Creatinine Clearance: 45.2 mL/min (A) (by C-G formula based on SCr of 1.75 mg/dL (H)). Recent Labs  Lab 06/20/2020 1849 06/30/2020 2127 06/22/20 0610 06/23/20 0657 06/24/20 6387 06/25/20 0441 06/26/20 0455 06/27/20 0608 06/28/20 0436  PROCALCITON  --   --  0.50 0.66 0.49  --   --   --   --   WBC 5.3  --  11.4* 12.0* 8.3 6.7 7.9 7.9 7.5  LATICACIDVEN 1.1 1.8  --   --   --   --   --   --   --     Liver Function Tests: Recent Labs  Lab 06/23/20 0657 06/24/20 0637 06/25/20 0441 06/26/20 0455 06/27/20 0608 06/28/20 0437  AST $Re'19 17 16 'vpw$ 48* 41  --   ALT $Re'9 9 11 'sCX$ 35 44  --   ALKPHOS 50 75 83 74 70  --   BILITOT 1.3* 0.8 0.9 0.7 0.7  --   PROT 5.9* 6.2* 6.7 6.3* 6.1*  --   ALBUMIN 2.7* 2.7* 2.6* 2.5* 2.4* 2.5*   No results for input(s): LIPASE, AMYLASE in the last 168 hours. No results for input(s): AMMONIA in the last 168 hours.  ABG    Component Value Date/Time   PHART 7.520 (H) 06/22/2020 1337   PCO2ART 57.3 (H) 06/22/2020 1337   PO2ART 169 (H) 06/22/2020 1337   HCO3 44.6 (H) 06/22/2020 1337   ACIDBASEDEF NOT CALCULATED 07/06/2020 2030   O2SAT 99.1 06/22/2020 1337     Coagulation Profile: No results for input(s): INR, PROTIME in the last 168 hours.  Cardiac Enzymes: No results for input(s): CKTOTAL, CKMB, CKMBINDEX, TROPONINI in the last 168 hours.  HbA1C: No results found for: HGBA1C  CBG: Recent Labs  Lab 06/27/20 1955 06/27/20 2208 06/28/20 0058 06/28/20 0743 06/28/20 1129  GLUCAP 260* 279* 281* 299* 283*      The patient is critically ill with multiple organ systems failure and requires high complexity decision making for  assessment and support, frequent evaluation and titration of therapies, application of advanced monitoring technologies and  extensive interpretation of multiple databases. Critical Care Time devoted to patient care services described in this note independent of APP/resident  time is 35 minutes.   Kara Mead MD. Shade Flood. [facility] Pager : [phone]  If no response to pager , please call [phone] until 7 pm After 7:00 pm call Elink  [phone]   06/28/2020

## 2020-06-28 NOTE — Progress Notes (Signed)
Pharmacy Antibiotic Note  Jay Skinner is a 60 y.o. male admitted on 06/18/2020 with sepsis.  Pharmacy has been consulted for Cefepime dosing.  Plan: Cefepime 2gm IV q8h F/U cxs, clinical progress, and LOT Monitor V/S, labs and levels as indicated  Height: 5\' 4"  (162.6 cm) Weight: 87 kg (191 lb 12.8 oz) IBW/kg (Calculated) : 59.2  Temp (24hrs), Avg:99 F (37.2 C), Min:98.2 F (36.8 C), Max:99.6 F (37.6 C)  Recent Labs  Lab 07/10/2020 1849 07/10/2020 2127 06/22/20 0610 06/24/20 0637 06/25/20 0441 06/26/20 0455 06/27/20 0608 06/28/20 0436 06/28/20 0437  WBC 5.3  --    < > 8.3 6.7 7.9 7.9 7.5  --   CREATININE 1.17  --    < > 1.03 1.06 1.56* 1.81*  --  1.75*  LATICACIDVEN 1.1 1.8  --   --   --   --   --   --   --    < > = values in this interval not displayed.    Estimated Creatinine Clearance: 45.2 mL/min (A) (by C-G formula based on SCr of 1.75 mg/dL (H)).    Allergies  Allergen Reactions  . Codeine Hives  . Lasix [Furosemide] Hives  . Latex Other (See Comments)  . Penicillins Dermatitis    Antimicrobials this admission: Vancomycin 4/5 >> 4/8 Cefepime 4/5 >>   Microbiology results: 4/5 BCx: ng 4/5 tracheal aspirate Cx: pseudomonas  MRSA PCR: neg  Thank you for allowing pharmacy to be a part of this patient's care.  Margot Ables, PharmD Clinical Pharmacist 06/28/2020 11:22 AM

## 2020-06-28 NOTE — Progress Notes (Signed)
Palliative:  HPI: 60 y.o.malewith past medical history of repaired tetralogy of Fallot, diastolic heart failure, persistent atrial fibrillation on apixaban, hypertension, chronic respiratory failure on 2L, paralyzed right diaphragm, diabetes, GI bleed (admission at [facility] 3/24-3/27 for GIB)admitted on 4/4/2022with decline with cough, weakness since discharge from [place] and admitted with sepsis pneumonia requiring mechanical ventilation.   I met today at [facility] bedside with his brother, Sonia Side, and sister and they have just had a conversation with Dr. Elsworth Soho. Theordore is s/p bronchoscopy and resting comfortably on ventilator. Family verify that they would never want tracheostomy and long term ventilator support for [facility]. We discussed further that this means we continue to optimize [facility] on vent but once off vent we would not replace. They would desire to utilize aggressive oxygen support and other interventions post extubation to continue to optimize him but we would not reintubate. We reviewed Nuel's life and history and knowledge that he has lived in his childhood home his entire life and how traumatic it would be for [place] not to be able to return home and go back to playing his harmonica. They continue to desire ongoing intubation as recommended to optimize and give Noe best chance.   All questions/concerns addressed. Emotional support provided.   Exam: Sedated after bronch. Not waking up or following commands past days as he had last week when I first saw him. Tolerating vent. No distress. Abd soft.   Plan:  - Family still hopeful and want [place] optimized on vent. No plans for reintubation or tracheostomy after extubation but open to other interventions/oxygen to support [place].   25 min  Vinie Sill, NP Palliative Medicine Team Pager [phone] (Please see [email or web address] for schedule) Team Phone [phone]    Greater than 50%  of this time was spent counseling and coordinating  care related to the above assessment and plan

## 2020-06-28 NOTE — Procedures (Signed)
Bronchoscopy Procedure Note  JARREL KNOKE  [id number]  1960-05-14  Date:06/28/20  Time:1:17 PM   Provider Performing:Anarely Nicholls V. Gricelda Foland   Procedure(s):  Flexible bronchoscopy with bronchial alveolar lavage (41324)  Indication(s) RLL atelectasis ,pseudomonas pneumonia  Consent Risks of the procedure as well as the alternatives and risks of each were explained to the patient and/or caregiver.  Consent for the procedure was obtained and is signed in the bedside chart  Anesthesia Propofol gtt   Time Out Verified patient identification, verified procedure, site/side was marked, verified correct patient position, special equipment/implants available, medications/allergies/relevant history reviewed, required imaging and test results available.   Sterile Technique Usual hand hygiene, masks, gowns, and gloves were used   Procedure Description Bronchoscope advanced through endotracheal tube and into airway.  Airways were examined down to subsegmental level with findings noted below.   Following diagnostic evaluation, BAL(s) performed in RLL with normal saline and return of 20cc of bloody fluid fluid  Findings: Thick secretions at end of ETT & lef sirways but minimal on RT. Bloody BAL obtained which cleared with sequential lavage suggseting traumatic rather than DAH   Complications/Tolerance None; patient tolerated the procedure well. Chest X-ray is not needed post procedure.   EBL Minimal   Specimen(s) BAL for culture  Arnell Mausolf V. Elsworth Soho MD

## 2020-06-28 NOTE — Progress Notes (Addendum)
Consent signed for bedside bronchoscopy and placed in front of chart. Brother Donye Campanelli who is at bedside signed for the patient.

## 2020-06-28 NOTE — Progress Notes (Signed)
PROGRESS NOTE   Jay Skinner  XHB:[id number] DOB: Apr 27, 1960 DOA: 07/16/2020 PCP: Sharilyn Sites, MD   Chief Complaint  Patient presents with  . Shortness of Breath   Level of care: ICU  Brief Admission History:  60 y.o. male with medical history significant for completely repaired tetralogy of Fallot , persistent atrial fibrillation , diabetes mellitus, GI bleed, hypertension, chronic respiratory failure on 2 L, paralyzed right diaphragm. History is obtained from chart review as at the time of evaluation patient is intubated.  Pt was brought to the ED via EMS.  Patient was recently discharged from [facility] after admission 3/24-3/27 for GI bleed.  Since discharge to home, patient has steadily declined.  Patient's brother found patient sitting in his chair, sleeping and drooling.  At baseline he is alert and conversant.  Family also reported that patient has had a cough over the past several days.  He was admitted with circulatory shock likely sepsis, pneumonia, acute respiratory failure.    Assessment & Plan:   Principal Problem:   Acute respiratory failure with hypoxia and hypercapnia (HCC) Active Problems:   Gastrointestinal hemorrhage associated with gastric ulcer   HTN (hypertension)   Persistent atrial fibrillation (HCC)   Tetralogy of Fallot   Type 2 diabetes mellitus (HCC)   Shock circulatory (HCC)   Metabolic alkalosis with respiratory acidosis   Atelectasis, right   1)Severe sepsis with septic shock secondary to community-acquired pneumonia --Patient met sepsis criteria with fevers, tachypnea, leukocytosis and acute hypoxic respiratory failure with persistent hypotension as well -Patient has been weaned off Levophed as of 06/24/2020 -Elevated PCT noted -Leukocytosis resolved on 06/24/2020 (leukocytosis may realcohol with current steroid therapy) -Continue IV cefepime   IV vancomycin discontinued on 06/26/2018 -Overall sepsis pathophysiology appears to be  resolving -Status post bronchoscopy on 06/28/2020 -Antimicrobials this admission: Vancomycin 4/5 >> 4/8 Cefepime 4/5 >>   Microbiology results: 4/5BCx: ngtd 4/5tracheal aspirate Cx: pseudomonas MRSA PCR: neg  2)Acute respiratory failure with hypoxia and hypercapnia---cannot rule out amiodarone induced lung toxicity-- -Remains intubated and sedated -PCCM consult noted --Status post bronchoscopy on 06/28/2020 -ESR is 107--- cannot rule out amiodarone induced lung toxicity -PTA patient was on amiodarone 400 mg daily, last dose was 06/24/2020 -Tracheal aspirate from 06/22/20 with Pseudomonas--currently on cefepime -started IV solu-medrol on 06/24/2020  3)Right pleural Effusion/possible pulmonary edema -Right-sided ultrasound does not demonstrate a pocket that is big enough to allow for safe thoracentesis   4)Persistent atrial fibrillation  -Stopped amiodarone on 06/24/2020-due to concerns about possible amiodarone toxicity as noted above - his apixaban had been on hold temporarily for recent GI bleeding  5)Chronic diastolic right sided heart failure - Hold bumex and spironolactone temporarily due to sepsis pathophysiology and aki - Follow I/O, weights, electrolytes  6)History of recent GI bleeding - IV protonix 40 mg BID for GI protection ordered  7)Type 2 DM  -Anticipate worsening glycemic control with initiation of IV steroids for amiodarone toxicity -Started Lantus 10 units daily Use Novolog/Humalog Sliding scale insulin with Accu-Cheks/Fingersticks as ordered   8)Repaired Tetralogy of Fallot - Followed by Dr. Alycia Rossetti at [facility]  9)FEN/hyperkalemia-- -Tolerating tube feed and water flushes well via OG tube, potassium normalized after Lokelma   10)Hypothyroidism--continue levothyroxine  11)AKI----acute kidney injury - creatinine 1.75 (peak was 1.81)  from 1.0--suspect due to diuresis -Urine output is improving -Diuretics were discontinued renally adjust medications, avoid  nephrotoxic agents / dehydration  / hypotension  12)Social/Ethics--palliative care consult appreciated patient is a DNR, with full scope of  treatment  -Prophylaxis-Protonix for GI prophylaxis  Lovenox/SCDs for DVT prophylaxis  CRITICAL CARE Performed by: Roxan Hockey   Total critical care time: 42 minutes  Critical care time was exclusive of separately billable procedures and treating other patients.  Critical care was necessary to treat or prevent imminent or life-threatening deterioration. -Remains critically ill, intubated and sedated Vent Settings:- PRVC/40%/5/15/470 -Off Levophed  Critical care was time spent personally by me on the following activities: development of treatment plan with patient and/or surrogate as well as nursing, discussions with consultants, evaluation of patient's response to treatment, examination of patient, obtaining history from patient or surrogate, ordering and performing treatments and interventions, ordering and review of laboratory studies, ordering and review of radiographic studies, pulse oximetry and re-evaluation of patient's condition.   DVT prophylaxis: SCD/Lovenox prophylactic dose Code Status: Full  Family Communication: Discussed with brothers Disposition: TBD Status is: Inpatient  Remains inpatient appropriate because:-Remains critically ill,  intubated and sedated   Dispo: The patient is from: Home              Anticipated d/c is to: TBD              Patient currently is not medically stable to d/c.   Difficult to place patient No  Consultants:   PCCM critical care  Procedures:  -Intubation 07/02/2020 -Status post bronchoscopy on 06/28/2020  Antimicrobials:  Cefepime 4/5>> Vancomycin 4/5>>   Subjective: -Remains critically ill, off IV Levophed, intubated and sedated Vent Settings:- PRVC/40%/5/15/470   - 06/28/20 -Weaning ok, urine output is adequate -Status post bronchoscopy on 06/28/2020 -Tolerating tube feed  and water flushes well - -Off propofol remained somewhat sleepy, patient did wean well  Objective: Vitals:   06/28/20 1621 06/28/20 1630 06/28/20 1645 06/28/20 1649  BP:  97/63 99/74   Pulse:  (!) 57 (!) 59   Resp:  (!) 6 11   Temp: 99.7 F (37.6 C)     TempSrc: Axillary     SpO2:  95% 95% 95%  Weight:      Height:        Intake/Output Summary (Last 24 hours) at 06/28/2020 1732 Last data filed at 06/28/2020 1621 Gross per 24 hour  Intake 1227.12 ml  Output 2300 ml  Net -1072.88 ml   Filed Weights   06/26/20 0500 06/27/20 0500 06/28/20 0500  Weight: 84.8 kg 84.4 kg 87 kg    Examination:  General exam: Pt sedated, intubated on vent,   HEENT-ET and OG tubes Respiratory system: Fair bilateral air movement, no wheezing Cardiovascular system: normal S1 & S2 heard.  Prior sternotomy scar Gastrointestinal system: Abdomen is nondistended, soft . Normal bowel sounds heard. Central nervous system: sedated on vent. No gross focal neurological deficits. Extremities: no cyanosis seen. Skin: No gross lesions seen.  Psychiatry: unable to assess.  MSK--right arm PICC line GU-Foley cath in situ with clear urine  Data Reviewed: I have personally reviewed following labs and imaging studies  CBC: Recent Labs  Lab 06/23/20 0657 06/24/20 0637 06/25/20 0441 06/26/20 0455 06/27/20 0608 06/28/20 0436  WBC 12.0* 8.3 6.7 7.9 7.9 7.5  NEUTROABS 9.6* 6.6 6.2 6.4 6.6  --   HGB 10.5* 10.2* 10.7* 10.1* 10.3* 10.4*  HCT 33.3* 33.4* 35.5* 32.9* 34.0* 33.5*  MCV 106.4* 109.2* 111.3* 111.5* 110.0* 107.4*  PLT 130* 110* 100* 121* 111* 112*    Basic Metabolic Panel: Recent Labs  Lab 06/22/20 1955 06/23/20 0657 06/23/20 2121 06/24/20 3500 06/25/20 0441 06/26/20 0455 06/27/20 9381 06/28/20  0437  NA  --  138  --  140 139 138 139 137  K  --  3.0*  --  4.6 5.3* 5.1 5.4* 4.8  CL  --  89*  --  97* 98 98 98 99  CO2  --  38*  --  35* $Re'31 30 28 26  'ITM$ GLUCOSE  --  173*  --  154* 237* 298* 307*  324*  BUN  --  28*  --  26* 30* 55* 78* 95*  CREATININE  --  1.10  --  1.03 1.06 1.56* 1.81* 1.75*  CALCIUM  --  7.2*  --  7.1* 7.1* 6.9* 7.2* 7.1*  MG  --  1.6*  --  2.1 2.3 2.4 2.7*  --   PHOS 2.0* 2.9 2.4*  --   --   --   --  3.3    GFR: Estimated Creatinine Clearance: 45.2 mL/min (A) (by C-G formula based on SCr of 1.75 mg/dL (H)).  Liver Function Tests: Recent Labs  Lab 06/23/20 0657 06/24/20 5885 06/25/20 0441 06/26/20 0455 06/27/20 0608 06/28/20 0437  AST $Re'19 17 16 'xda$ 48* 41  --   ALT $Re'9 9 11 'caO$ 35 44  --   ALKPHOS 50 75 83 74 70  --   BILITOT 1.3* 0.8 0.9 0.7 0.7  --   PROT 5.9* 6.2* 6.7 6.3* 6.1*  --   ALBUMIN 2.7* 2.7* 2.6* 2.5* 2.4* 2.5*    CBG: Recent Labs  Lab 06/27/20 2208 06/28/20 0058 06/28/20 0743 06/28/20 1129 06/28/20 1624  GLUCAP 279* 281* 299* 283* 266*    Recent Results (from the past 240 hour(s))  Resp Panel by RT-PCR (Flu A&B, Covid) Nasopharyngeal Swab     Status: None   Collection Time: 07/04/2020  7:04 PM   Specimen: Nasopharyngeal Swab; Nasopharyngeal(NP) swabs in vial transport medium  Result Value Ref Range Status   SARS Coronavirus 2 by RT PCR NEGATIVE NEGATIVE Final    Comment: (NOTE) SARS-CoV-2 target nucleic acids are NOT DETECTED.  The SARS-CoV-2 RNA is generally detectable in upper respiratory specimens during the acute phase of infection. The lowest concentration of SARS-CoV-2 viral copies this assay can detect is 138 copies/mL. A negative result does not preclude SARS-Cov-2 infection and should not be used as the sole basis for treatment or other patient management decisions. A negative result may occur with  improper specimen collection/handling, submission of specimen other than nasopharyngeal swab, presence of viral mutation(s) within the areas targeted by this assay, and inadequate number of viral copies(<138 copies/mL). A negative result must be combined with clinical observations, patient history, and  epidemiological information. The expected result is Negative.  Fact Sheet for Patients:  EntrepreneurPulse.com.au  Fact Sheet for Healthcare Providers:  IncredibleEmployment.be  This test is no t yet approved or cleared by the [place] FDA and  has been authorized for detection and/or diagnosis of SARS-CoV-2 by FDA under an Emergency Use Authorization (EUA). This EUA will remain  in effect (meaning this test can be used) for the duration of the COVID-19 declaration under Section 564(b)(1) of the Act, 21 U.S.C.section 360bbb-3(b)(1), unless the authorization is terminated  or revoked sooner.       Influenza A by PCR NEGATIVE NEGATIVE Final   Influenza B by PCR NEGATIVE NEGATIVE Final    Comment: (NOTE) The Xpert Xpress SARS-CoV-2/FLU/RSV plus assay is intended as an aid in the diagnosis of influenza from Nasopharyngeal swab specimens and should not be used as a sole basis for treatment.  Nasal washings and aspirates are unacceptable for Xpert Xpress SARS-CoV-2/FLU/RSV testing.  Fact Sheet for Patients: EntrepreneurPulse.com.au  Fact Sheet for Healthcare Providers: IncredibleEmployment.be  This test is not yet approved or cleared by the [place] FDA and has been authorized for detection and/or diagnosis of SARS-CoV-2 by FDA under an Emergency Use Authorization (EUA). This EUA will remain in effect (meaning this test can be used) for the duration of the COVID-19 declaration under Section 564(b)(1) of the Act, 21 U.S.C. section 360bbb-3(b)(1), unless the authorization is terminated or revoked.  Performed at [facility], [address], [place], [place] [address]   Culture, blood (single)     Status: None   Collection Time: 07/17/2020  8:28 PM   Specimen: BLOOD RIGHT HAND  Result Value Ref Range Status   Specimen Description BLOOD RIGHT HAND  Final   Special Requests   Final    BOTTLES DRAWN  AEROBIC AND ANAEROBIC Blood Culture adequate volume   Culture   Final    NO GROWTH 5 DAYS Performed at [facility], [address] [place], [place] [address]    Report Status 06/26/2020 FINAL  Final  MRSA PCR Screening     Status: None   Collection Time: 06/22/20 12:29 AM   Specimen: Nasal Mucosa; Nasopharyngeal  Result Value Ref Range Status   MRSA by PCR NEGATIVE NEGATIVE Final    Comment:        The GeneXpert MRSA Assay (FDA approved for NASAL specimens only), is one component of a comprehensive MRSA colonization surveillance program. It is not intended to diagnose MRSA infection nor to guide or monitor treatment for MRSA infections. Performed at [facility], [address], [place], [place] [address]   Culture, blood (routine x 2)     Status: None   Collection Time: 06/22/20  6:00 AM   Specimen: Left Antecubital; Blood  Result Value Ref Range Status   Specimen Description LEFT ANTECUBITAL  Final   Special Requests   Final    BOTTLES DRAWN AEROBIC AND ANAEROBIC Blood Culture adequate volume   Culture   Final    NO GROWTH 6 DAYS Performed at [facility], [address], [place], [place] [address]    Report Status 06/28/2020 FINAL  Final  Culture, blood (routine x 2)     Status: None   Collection Time: 06/22/20  6:09 AM   Specimen: BLOOD LEFT HAND  Result Value Ref Range Status   Specimen Description BLOOD LEFT HAND  Final   Special Requests   Final    BOTTLES DRAWN AEROBIC AND ANAEROBIC Blood Culture adequate volume   Culture   Final    NO GROWTH 6 DAYS Performed at [facility], [address] [place], [place] [address]    Report Status 06/28/2020 FINAL  Final  Culture, Respiratory w Gram Stain     Status: None   Collection Time: 06/22/20  8:42 AM   Specimen: Tracheal Aspirate; Respiratory  Result Value Ref Range Status   Specimen Description   Final    TRACHEAL ASPIRATE Performed at [facility], [address], [place], [place] [address]    Special Requests    Final    Normal Performed at [facility], [address], [place], [place] [address]    Gram Stain   Final    RARE WBC PRESENT,BOTH PMN AND MONONUCLEAR FEW GRAM POSITIVE RODS FEW GRAM POSITIVE COCCI IN PAIRS IN CLUSTERS RARE GRAM NEGATIVE RODS Performed at [facility], [place]  [address] [place], [place] [address]    Culture RARE PSEUDOMONAS AERUGINOSA  Final   Report Status 06/25/2020 FINAL  Final   Organism ID, Bacteria PSEUDOMONAS AERUGINOSA  Final      Susceptibility   Pseudomonas aeruginosa - MIC*    CEFTAZIDIME 4 SENSITIVE Sensitive     CIPROFLOXACIN <=0.25 SENSITIVE Sensitive     GENTAMICIN 4 SENSITIVE Sensitive     IMIPENEM 2 SENSITIVE Sensitive     PIP/TAZO 8 SENSITIVE Sensitive     CEFEPIME 2 SENSITIVE Sensitive     * RARE PSEUDOMONAS AERUGINOSA     Radiology Studies: DG CHEST PORT 1 VIEW  Result Date: 06/27/2020 CLINICAL DATA:  Hypoxia EXAM: PORTABLE CHEST 1 VIEW COMPARISON:  June 24, 2020 FINDINGS: Endotracheal tube tip is 4.3 cm above the carina. Nasogastric tube tip and side port are below the diaphragm. Central catheter tip is in the right atrium, stable. No pneumothorax. There is a right pleural effusion with airspace opacity in the right mid and lower lung regions. There is left base atelectasis. There is cardiomegaly with pulmonary venous hypertension. Prior median sternotomy with apparent cardiac valve replacement. There is aortic atherosclerosis. No bone lesions. IMPRESSION: Tube and catheter positions as described without pneumothorax. Persistent cardiomegaly with pulmonary vascular congestion. Right pleural effusion with airspace opacity in the right mid and lower lung regions, likely due to combination of atelectasis and potential pneumonia. A degree of pulmonary edema in this area is possible as well. Postoperative changes.  Aortic Atherosclerosis (ICD10-I70.0). Electronically Signed   By: Lowella Grip III M.D.   On: 06/27/2020 08:14   DG ABD ACUTE 2+V  W 1V CHEST  Result Date: 06/28/2020 CLINICAL DATA:  Abdominal distension EXAM: DG ABDOMEN ACUTE WITH 1 VIEW CHEST COMPARISON:  None. FINDINGS: Nasogastric tube terminates in the left upper quadrant in the expected site of the stomach. No dilated loops of bowel to indicate ileus or obstruction. No suspicious calcifications of the abdomen or pelvis. Diffuse bilateral airspace opacities. Aortic stent is noted. Endotracheal tube tip located approximately 3 cm of the carina. IMPRESSION: 1. Nonobstructive, nonspecific bowel gas pattern. 2. Diffuse bilateral lung opacities, worse on the right, similar to prior exam. Electronically Signed   By: Miachel Roux M.D.   On: 06/28/2020 07:52    Scheduled Meds: . chlorhexidine gluconate (MEDLINE KIT)  15 mL Mouth Rinse BID  . Chlorhexidine Gluconate Cloth  6 each Topical Daily  . docusate  100 mg Per Tube BID  . enoxaparin (LOVENOX) injection  40 mg Subcutaneous Q24H  . free water  100 mL Per Tube Q4H  . insulin aspart  0-20 Units Subcutaneous TID WC  . insulin aspart  0-5 Units Subcutaneous QHS  . insulin glargine  10 Units Subcutaneous Daily  . levothyroxine  50 mcg Per Tube Daily  . mouth rinse  15 mL Mouth Rinse 10 times per day  . [START ON 06/29/2020] methylPREDNISolone (SOLU-MEDROL) injection  40 mg Intravenous Daily  . pantoprazole (PROTONIX) IV  40 mg Intravenous Q12H  . polyethylene glycol  17 g Per Tube Daily  . sodium chloride flush  10-40 mL Intracatheter Q12H   Continuous Infusions: . sodium chloride Stopped (06/23/20 1400)  . ceFEPime (MAXIPIME) IV Stopped (06/28/20 2229)  . dexmedetomidine (PRECEDEX) IV infusion 0.4 mcg/kg/hr (06/28/20 1522)  . feeding supplement (VITAL AF 1.2 CAL) 1,000 mL (06/28/20 1532)     LOS: 7 days   Roxan Hockey, MD How to contact the [facility] Attending or Consulting provider Center  or covering provider during after hours [place], for this patient?  1. Check the care team in [facility] and look for a)  attending/consulting [facility] provider listed and b) the [facility] team listed 2. Log into [email or web address] and use [place]'s universal password to access. If you do not have the password, please contact the hospital operator. 3. Locate the [facility] provider you are looking for under [facility] and page to a number that you can be directly reached. 4. If you still have difficulty reaching the provider, please page the [facility] (Director on Call) for the Hospitalists listed on amion for assistance.  06/28/2020, 5:32 PM

## 2020-06-28 NOTE — Progress Notes (Signed)
Per Dr. Elsworth Soho, tube feedings stopped at 0900 as well as fluids discontinued. Sedation to remain off until around 1300, then can be turned back on an hour before the bedside bronchoscopy. Consent to be obtained via family members at the bedside. Pt in no distress, will continue to monitor.

## 2020-06-29 ENCOUNTER — Inpatient Hospital Stay (HOSPITAL_COMMUNITY): Payer: Medicare Other

## 2020-06-29 DIAGNOSIS — R6521 Severe sepsis with septic shock: Secondary | ICD-10-CM

## 2020-06-29 DIAGNOSIS — A419 Sepsis, unspecified organism: Principal | ICD-10-CM

## 2020-06-29 DIAGNOSIS — N179 Acute kidney failure, unspecified: Secondary | ICD-10-CM | POA: Diagnosis not present

## 2020-06-29 DIAGNOSIS — J9602 Acute respiratory failure with hypercapnia: Secondary | ICD-10-CM | POA: Diagnosis not present

## 2020-06-29 DIAGNOSIS — J9601 Acute respiratory failure with hypoxia: Secondary | ICD-10-CM | POA: Diagnosis not present

## 2020-06-29 LAB — PHOSPHORUS: Phosphorus: 2.7 mg/dL (ref 2.5–4.6)

## 2020-06-29 LAB — MAGNESIUM: Magnesium: 2.9 mg/dL — ABNORMAL HIGH (ref 1.7–2.4)

## 2020-06-29 LAB — GLUCOSE, CAPILLARY
Glucose-Capillary: 211 mg/dL — ABNORMAL HIGH (ref 70–99)
Glucose-Capillary: 241 mg/dL — ABNORMAL HIGH (ref 70–99)
Glucose-Capillary: 193 mg/dL — ABNORMAL HIGH (ref 70–99)
Glucose-Capillary: 288 mg/dL — ABNORMAL HIGH (ref 70–99)
Glucose-Capillary: 289 mg/dL — ABNORMAL HIGH (ref 70–99)
Glucose-Capillary: 285 mg/dL — ABNORMAL HIGH (ref 70–99)

## 2020-06-29 LAB — BASIC METABOLIC PANEL
Anion gap: 12 (ref 5–15)
BUN: 101 mg/dL — ABNORMAL HIGH (ref 6–20)
CO2: 27 mmol/L (ref 22–32)
Calcium: 7.2 mg/dL — ABNORMAL LOW (ref 8.9–10.3)
Chloride: 100 mmol/L (ref 98–111)
Creatinine, Ser: 1.82 mg/dL — ABNORMAL HIGH (ref 0.61–1.24)
GFR, Estimated: 42 mL/min — ABNORMAL LOW (ref >60)
Glucose, Bld: 249 mg/dL — ABNORMAL HIGH (ref 70–99)
Potassium: 4.1 mmol/L (ref 3.5–5.1)
Sodium: 139 mmol/L (ref 135–145)

## 2020-06-29 MED ORDER — NOREPINEPHRINE 4 MG/250ML-% IV SOLN
0.0000 ug/min | INTRAVENOUS | Status: DC
  Administered 2020-06-29: 2 ug/min via INTRAVENOUS
  Administered 2020-06-29: 7.5 ug/min via INTRAVENOUS
  Administered 2020-06-29: 20 ug/min via INTRAVENOUS
  Administered 2020-06-29: 10 ug/min via INTRAVENOUS
  Filled 2020-06-29 (×4): qty 250

## 2020-06-29 NOTE — Progress Notes (Signed)
NAME:  Jay Skinner, MRN:  [id number], DOB:  1960/04/01, LOS: 8 ADMISSION DATE:  07/05/2020, CONSULTATION DATE:  4/5 REFERRING MD:  Donia Ast CHIEF COMPLAINT:  Acute on chronic hypoxemic/hypercarbic Resp failure  Brief patient profile:    History of Present Illness:   60 y.o. male with medical history significant for completely repaired tetralogy of Fallot , persistent atrial fibrillation , diabetes mellitus, GI bleed, hypertension, chronic respiratory failure on 2 L, paralyzed right diaphragm. History is obtained from chart review as at the time of evaluation patient is intubated.  Patient was brought to the ED via EMS.  Patient was recently discharged from [facility] after admission 3/24-3/27 for GI bleed.  Since discharge to home, patient has steadily declined.  Today patient's brother found patient sitting in his chair, sleeping and drooling.  At baseline he is alert and conversant.  Family also reported that patient has had a cough over the past several days.  Hospitalization here at [facility] 3/17 - 3/18-for acute upper GI bleed and acute gastroenteritis.  EGD was deferred for patient to follow-up with his gastroenterologist at [facility].  Hemoglobin remained stable at 11.8 during admission.  He was advised to stop taking his apixaban until he followed up with GI.  ED Course:  Heart rate 67s to 80s.  resp rate 24-32.  Blood pressure systolic 100-174.  O2 sats 91 to 97% on 6 L.  Venous blood gas showed pH of 7.1, VCO2 greater than 120.  BNP 368.  Lactic acid 1.1.  Initial portable chest x-ray showed-complete opacification of the right hemithorax, perihilar infiltration on the left.  Post intubation shunt chest x-ray shows significant improvement in   aeration of right lung.  Patient was initially tried on BiPAP and subsequently intubated. ED provider talked to critical care, okay with admission at [facility].  Pertinent  Medical History  Arthritis Asthma Heart disease sp   completely repaired tetralogy of Fallot History of bleeding ulcers Hypertension Hypothyroidism  Significant Hospital Events: Including procedures, antibiotic start and stop dates in addition to other pertinent events   . Resp viral panel 4/4 > neg covid/ flu  . BC x 1  4/4 >>>ng . ET 4/4 >> . BC x 2 4/5 >>>ng . MRSA PCR 4/5>  Neg . Resp culture/et 4/5 >>>  rare pseudomonaas . Maxepime 4/6 >>> . Vanc IV 4/6  >  4/7  EEG  4/5 >>> moderate to severe diffuse encephalopathy, nonspecific etiology. No seizures or epileptiform discharges were seen throughout the recording. Marland Kitchen [place] R chest 4/6 > not enough effusion to tap  . R PIC 4/6 >>> . ESR  4/7  = 107  > solumedrol 60 q 8 and d/c'd amiodarone . 4/8 bronchoscopy >> thick secretions suctioned out of airways   Scheduled Meds: . chlorhexidine gluconate (MEDLINE KIT)  15 mL Mouth Rinse BID  . Chlorhexidine Gluconate Cloth  6 each Topical Daily  . docusate  100 mg Per Tube BID  . enoxaparin (LOVENOX) injection  40 mg Subcutaneous Q24H  . free water  100 mL Per Tube Q4H  . insulin aspart  0-20 Units Subcutaneous TID WC  . insulin aspart  0-5 Units Subcutaneous QHS  . insulin glargine  10 Units Subcutaneous Daily  . levothyroxine  50 mcg Per Tube Daily  . mouth rinse  15 mL Mouth Rinse 10 times per day  . methylPREDNISolone (SOLU-MEDROL) injection  40 mg Intravenous Daily  . pantoprazole (PROTONIX) IV  40 mg  Intravenous Q12H  . polyethylene glycol  17 g Per Tube Daily  . sodium chloride flush  10-40 mL Intracatheter Q12H   Continuous Infusions: . sodium chloride Stopped (06/23/20 1400)  . ceFEPime (MAXIPIME) IV Stopped (06/28/20 2310)  . dexmedetomidine (PRECEDEX) IV infusion Stopped (06/28/20 2227)  . feeding supplement (VITAL AF 1.2 CAL) 1,000 mL (06/28/20 1532)  . norepinephrine (LEVOPHED) Adult infusion 10 mcg/min (06/29/20 0731)   PRN Meds:.acetaminophen **OR** acetaminophen, albuterol, fentaNYL (SUBLIMAZE) injection, fentaNYL  (SUBLIMAZE) injection, naphazoline-pheniramine, polyethylene glycol, sodium chloride flush    Interim History / Subjective:   Remains critically ill, intubated, on pressors Switch from propofol to Precedex Febrile 101 overnight. Started back on Levophed for soft blood pressure. Urine output improved last 12 hours   Objective   Blood pressure 103/63, pulse 85, temperature 100.1 F (37.8 C), temperature source Axillary, resp. rate (!) 24, height 5\' 4"  (1.626 m), weight 84.8 kg, SpO2 100 %. CVP:  [11 mmHg-27 mmHg] 19 mmHg  Vent Mode: CPAP FiO2 (%):  [40 %] 40 % Set Rate:  [14 bmp] 14 bmp Vt Set:  [470 mL] 470 mL PEEP:  [5 cmH20] 5 cmH20 Pressure Support:  [14 cmH20-15 cmH20] 14 cmH20 Plateau Pressure:  [22 cmH20-30 cmH20] 27 cmH20   Intake/Output Summary (Last 24 hours) at 06/29/2020 0857 Last data filed at 06/29/2020 0800 Gross per 24 hour  Intake 652.74 ml  Output 2250 ml  Net -1597.26 ml   Filed Weights   06/27/20 0500 06/28/20 0500 06/29/20 0428  Weight: 84.4 kg 87 kg 84.8 kg   CVP:  [11 mmHg-27 mmHg] 19 mmHg   Examination:  Chronically ill-appearing, no distress, off Precedex this a.m. No jvd Oropharynx  ET /og Neck supple Lungs decreased breath sounds bilateral, no accessory muscle use RRR no s3 or or significant  murmur Abd mod distended with decreased bs/ excursion limited  Ext warm 2+bilateral ext  edema   Neuro -nods head to name but does not follow commands, appears weak and deconditioned   Chest x-ray independently reviewed, ET tube in position, improved aeration right lower lobe although elevated right hemidiaphragm persists  Labs show normal electrolytes, rising BUN and slight rise in creatinine  Resolved Hospital Problem list      Assessment & Plan:  1) Acute on chronic hypercabic and hypoxemic Resp failure with post hypercapneic met alkalosis -Limited progress with weaning due to deconditioning, fluid, abdominal distention, right hemidiaphragm  paralysis -Improved right lower lobe atelectasis post bronchoscopy  -Continue spontaneous breathing trials on pressure support , tidal volumes are very low on lower pressure support, seems to tolerate 12/5     2) Prob HCAP vs asp pna on R in pt with chronically elevated RHD ? 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 -Presume sedation related but will need to be vigilant for new sepsis , stay off Precedex unless absolutely needed for agitation, can use fentanyl as needed     5) AKI secondary to overdiuresis versus sepsis, 10 L positive balance Lab Results  Component Value Date   CREATININE 1.82 (H) 06/29/2020   CREATININE 1.75 (H) 06/28/2020   CREATININE 1.81 (H) 06/27/2020   -Resume Lasix once creatinine starts downtrending   Best practice (right click and "Reselect all SmartList Selections" daily)  Diet:  Tube Feed  Pain/Anxiety/Delirium protocol (if indicated):  Precedex + int fent, goal RASS 0 VAP protocol (if indicated): Yes DVT prophylaxis: LMWH and SCD GI prophylaxis: PPI Glucose control:  SSI No Central venous access:  Yes, and it is still needed Arterial line:  N/A Foley:  N/A Mobility:  OOB  PT consulted: N/A Last date of multidisciplinary goals of care discussion 4/11 brother Sonia Side and sister, palliative care RN Elmo Putt present , Jaydan plays the harmonica, he would never want to be in a nursing home with a tracheostomy, no reintubation no tracheostomy Code Status:  DNR Disposition: ICU   Labs   CBC: Recent Labs  Lab 06/23/20 0657 06/24/20 0637 06/25/20 0441 06/26/20 0455 06/27/20 0608 06/28/20 0436  WBC 12.0* 8.3 6.7 7.9 7.9 7.5  NEUTROABS 9.6* 6.6 6.2 6.4 6.6  --   HGB 10.5* 10.2* 10.7* 10.1* 10.3* 10.4*  HCT 33.3*  33.4* 35.5* 32.9* 34.0* 33.5*  MCV 106.4* 109.2* 111.3* 111.5* 110.0* 107.4*  PLT 130* 110* 100* 121* 111* 112*    Basic Metabolic Panel: Recent Labs  Lab 06/22/20 1955 06/23/20 0657 06/23/20 0657 06/23/20 2121 06/24/20 2707 06/25/20 0441 06/26/20 0455 06/27/20 0608 06/28/20 0437 06/29/20 0425  NA  --  138   < >  --  140 139 138 139 137 139  K  --  3.0*   < >  --  4.6 5.3* 5.1 5.4* 4.8 4.1  CL  --  89*   < >  --  97* 98 98 98 99 100  CO2  --  38*   < >  --  35* $Re'31 30 28 26 27  'xkZ$ GLUCOSE  --  173*   < >  --  154* 237* 298* 307* 324* 249*  BUN  --  28*   < >  --  26* 30* 55* 78* 95* 101*  CREATININE  --  1.10   < >  --  1.03 1.06 1.56* 1.81* 1.75* 1.82*  CALCIUM  --  7.2*   < >  --  7.1* 7.1* 6.9* 7.2* 7.1* 7.2*  MG  --  1.6*   < >  --  2.1 2.3 2.4 2.7*  --  2.9*  PHOS 2.0* 2.9  --  2.4*  --   --   --   --  3.3 2.7   < > = values in this interval not displayed.   GFR: Estimated Creatinine Clearance: 42.9 mL/min (A) (by C-G formula based on SCr of 1.82 mg/dL (H)). Recent Labs  Lab 06/23/20 0657 06/24/20 0637 06/25/20 0441 06/26/20 0455 06/27/20 0608 06/28/20 0436  PROCALCITON 0.66 0.49  --   --   --   --   WBC 12.0* 8.3 6.7 7.9 7.9 7.5    Liver Function Tests: Recent Labs  Lab 06/23/20 0657 06/24/20 0637 06/25/20 0441 06/26/20 0455 06/27/20 0608 06/28/20 0437  AST $Re'19 17 16 'aLx$ 48* 41  --   ALT $Re'9 9 11 'wUz$ 35 44  --   ALKPHOS 50 75 83 74 70  --   BILITOT 1.3* 0.8 0.9 0.7 0.7  --   PROT 5.9* 6.2* 6.7 6.3* 6.1*  --   ALBUMIN 2.7* 2.7* 2.6* 2.5* 2.4* 2.5*   No results for input(s): LIPASE, AMYLASE in the last 168 hours. No results for input(s): AMMONIA in the last 168 hours.  ABG    Component Value Date/Time   PHART 7.520 (H) 06/22/2020 1337   PCO2ART 57.3 (H) 06/22/2020 1337   PO2ART 169 (H) 06/22/2020 1337  HCO3 44.6 (H) 06/22/2020 1337   ACIDBASEDEF NOT CALCULATED 06/20/2020 2030   O2SAT 99.1 06/22/2020 1337     Coagulation Profile: No results for  input(s): INR, PROTIME in the last 168 hours.  Cardiac Enzymes: No results for input(s): CKTOTAL, CKMB, CKMBINDEX, TROPONINI in the last 168 hours.  HbA1C: No results found for: HGBA1C  CBG: Recent Labs  Lab 06/28/20 1624 06/28/20 1929 06/28/20 2225 06/29/20 0404 06/29/20 0736  GLUCAP 266* 263* 298* 211* 241*      The patient is critically ill with multiple organ systems failure and requires high complexity decision making for assessment and support, frequent evaluation and titration of therapies, application of advanced monitoring technologies and extensive interpretation of multiple databases. Critical Care Time devoted to patient care services described in this note independent of APP/resident  time is 33 minutes.    Kara Mead MD. Shade Flood. [facility] Pager : [phone]  If no response to pager , please call [phone] until 7 pm After 7:00 pm call Elink  [phone]   06/29/2020

## 2020-06-29 NOTE — Progress Notes (Signed)
Palliative:  No family at bedside today with 3 attempts. Noted fever and pressors needed. I worry more and more that Jay Skinner is not going to have the recovery we initially hoped for. I would like to speak with family more regarding potential need for one way extubation to comfort. I also worry that the longer Jay Skinner remains critically ill the further away we get from him having a recovery that would allow him to return home and have what he would consider a good quality of life. Ongoing discussions with family tomorrow.   No charge  Vinie Sill, NP Palliative Medicine Team Pager [phone] (Please see [email or web address] for schedule) Team Phone [phone]

## 2020-06-29 NOTE — Progress Notes (Signed)
PROGRESS NOTE   Jay Skinner  FIE:[id number] DOB: June 29, 1960 DOA: 06/30/2020 PCP: Sharilyn Sites, MD   Chief Complaint  Patient presents with  . Shortness of Breath   Level of care: ICU  Brief Admission History:  60 y.o. male with medical history significant for completely repaired tetralogy of Fallot , persistent atrial fibrillation , diabetes mellitus, GI bleed, hypertension, chronic respiratory failure on 2 L, paralyzed right diaphragm. History is obtained from chart review as at the time of evaluation patient is intubated.  Pt was brought to the ED via EMS.  Patient was recently discharged from [facility] after admission 3/24-3/27 for GI bleed.  Since discharge to home, patient has steadily declined.  Patient's brother found patient sitting in his chair, sleeping and drooling.  At baseline he is alert and conversant.  Family also reported that patient has had a cough over the past several days.  He was admitted with circulatory shock likely sepsis, pneumonia, acute respiratory failure.    Assessment & Plan:   Principal Problem:   Acute respiratory failure with hypoxia and hypercapnia (HCC) Active Problems:   Gastrointestinal hemorrhage associated with gastric ulcer   HTN (hypertension)   Persistent atrial fibrillation (HCC)   Tetralogy of Fallot   Type 2 diabetes mellitus (HCC)   Shock circulatory (HCC)   Metabolic alkalosis with respiratory acidosis   Atelectasis, right  A/p 1)Severe sepsis with septic shock secondary to community-acquired pneumonia --Patient met sepsis criteria with fevers, tachypnea, leukocytosis and acute hypoxic respiratory failure with persistent hypotension as well -Patient was initially weaned off Levophed as of 06/24/2020, back on Levophed as of 06/29/2020 -Elevated PCT noted -Leukocytosis resolved on 06/24/2020 -We will repeat blood cultures on 06/29/20 due to fevers and hypotension requiring Levophed -Continue IV cefepime   IV vancomycin  discontinued on 06/26/2018 -Status post bronchoscopy on 06/28/2020 -Antimicrobials this admission: Vancomycin 4/5 >> 4/8 Cefepime 4/5 >>   Microbiology results: 4/5BCx: ngtd 4/5tracheal aspirate Cx: pseudomonas MRSA PCR: neg -Repeat blood cultures 06/29/2020  2)Acute respiratory failure with hypoxia and hypercapnia---cannot rule out amiodarone induced lung toxicity-- -Remains intubated  -PCCM consult noted --Status post bronchoscopy on 06/28/2020 -ESR is 107--- cannot rule out amiodarone induced lung toxicity -PTA patient was on amiodarone 400 mg daily, last dose was 06/24/2020 -Tracheal aspirate from 06/22/20 with Pseudomonas--currently on cefepime -started IV solu-medrol on 06/24/2020, solu- Medrol discontinued on 06/29/2020 -Patient remains very lethargic despite being off propofol and off Precedex drips  3)Right pleural Effusion/possible pulmonary edema -Right-sided ultrasound does not demonstrate a pocket that is big enough to allow for safe thoracentesis  4)Persistent atrial fibrillation  -Stopped amiodarone on 06/24/2020-due to concerns about possible amiodarone toxicity as noted above - his apixaban had been on hold temporarily for recent GI bleeding  5)Chronic diastolic right sided heart failure - Hold bumex and spironolactone temporarily due to sepsis pathophysiology with hypotension and aki - Follow I/O, weights, electrolytes  6)History of recent GI bleeding - IV protonix 40 mg BID for GI protection ordered  7)Type 2 DM  -Anticipate worsening glycemic control with initiation of IV steroids for amiodarone toxicity -Started Lantus 10 units daily Use Novolog/Humalog Sliding scale insulin with Accu-Cheks/Fingersticks as ordered   8)Repaired Tetralogy of Fallot - Followed by Dr[facility]lycia [facility] at [facility]  9)FEN/hyperkalemia-- -Tolerating tube feed and water flushes well via OG tube, potassium normalized after Lokelma   10)Hypothyroidism--continue levothyroxine  11)AKI----acute  kidney injury - creatinine 1.82 (peak)  from 1.0--suspect due to diuresis -Urine output is improving -Diuretics were  discontinued renally adjust medications, avoid nephrotoxic agents / dehydration  / hypotension  12)Social/Ethics--palliative care consult appreciated patient is a DNR, with full scope of treatment  13) acute metabolic encephalopathy-- -Patient remains very lethargic despite being off propofol and off Precedex drips  -Prophylaxis-Protonix for GI prophylaxis  Lovenox/SCDs for DVT prophylaxis  CRITICAL CARE Performed by: Roxan Hockey   Total critical care time: 41 minutes  Critical care time was exclusive of separately billable procedures and treating other patients.  Critical care was necessary to treat or prevent imminent or life-threatening deterioration. -Remains critically ill, intubated  Vent Settings:- PRVC/40%/5/15/470 -Back on Levophed  Critical care was time spent personally by me on the following activities: development of treatment plan with patient and/or surrogate as well as nursing, discussions with consultants, evaluation of patient's response to treatment, examination of patient, obtaining history from patient or surrogate, ordering and performing treatments and interventions, ordering and review of laboratory studies, ordering and review of radiographic studies, pulse oximetry and re-evaluation of patient's condition.   DVT prophylaxis: SCD/Lovenox prophylactic dose Code Status: Full  Family Communication: Discussed with brothers Disposition: TBD Status is: Inpatient  Remains inpatient appropriate because:-Remains critically ill,  intubated and sedated   Dispo: The patient is from: Home              Anticipated d/c is to: TBD              Patient currently is not medically stable to d/c.   Difficult to place patient No  Consultants:   PCCM critical care  Procedures:  -Intubation 06/18/2020 -Status post bronchoscopy on  06/28/2020  Antimicrobials:  Cefepime 4/5>> Vancomycin 4/5>>   Subjective: -Remains critically ill,  - 06/29/20 -Back on Levophed  -Had fevers --Patient remains very lethargic despite being off propofol and off Precedex drips vent Settings:- PRVC/40%/5/15/470     Objective: Vitals:   06/29/20 1300 06/29/20 1400 06/29/20 1500 06/29/20 1542  BP: 121/80 109/73 113/71   Pulse: 91 87 87   Resp: (!) 24 (!) 24 (!) 26   Temp:    100.2 F (37.9 C)  TempSrc:    Axillary  SpO2: 96% 97% 96%   Weight:      Height:        Intake/Output Summary (Last 24 hours) at 06/29/2020 1812 Last data filed at 06/29/2020 1543 Gross per 24 hour  Intake 580.81 ml  Output 2400 ml  Net -1819.19 ml   Filed Weights   06/27/20 0500 06/28/20 0500 06/29/20 0428  Weight: 84.4 kg 87 kg 84.8 kg    Examination:  General exam: intubated on vent,   HEENT-ET and OG tubes Respiratory system: Fair bilateral air movement, no wheezing Cardiovascular system: normal S1 & S2 heard.  Prior sternotomy scar Gastrointestinal system: Abdomen is nondistended, soft . Normal bowel sounds heard. Central nervous system: Lethargic. No gross focal neurological deficits. Extremities: no cyanosis seen. Skin: No gross lesions seen.  Psychiatry: unable to assess.  MSK--right arm PICC line GU-Foley cath in situ with clear urine  Data Reviewed: I have personally reviewed following labs and imaging studies  CBC: Recent Labs  Lab 06/23/20 0657 06/24/20 0637 06/25/20 0441 06/26/20 0455 06/27/20 0608 06/28/20 0436  WBC 12.0* 8.3 6.7 7.9 7.9 7.5  NEUTROABS 9.6* 6.6 6.2 6.4 6.6  --   HGB 10.5* 10.2* 10.7* 10.1* 10.3* 10.4*  HCT 33.3* 33.4* 35.5* 32.9* 34.0* 33.5*  MCV 106.4* 109.2* 111.3* 111.5* 110.0* 107.4*  PLT 130* 110* 100* 121* 111* 112*  Basic Metabolic Panel: Recent Labs  Lab 06/22/20 1955 06/23/20 0657 06/23/20 0657 06/23/20 2121 06/24/20 5638 06/25/20 0441 06/26/20 0455 06/27/20 0608  06/28/20 0437 06/29/20 0425  NA  --  138   < >  --  140 139 138 139 137 139  K  --  3.0*   < >  --  4.6 5.3* 5.1 5.4* 4.8 4.1  CL  --  89*   < >  --  97* 98 98 98 99 100  CO2  --  38*   < >  --  35* _0 GLUCOSE  --  173*   < >  --  154* 237* 298* 307* 324* 249*  BUN  --  28*   < >  --  26* 30* 55* 78* 95* 101*  CREATININE  --  1.10   < >  --  1.03 1.06 1.56* 1.81* 1.75* 1.82*  CALCIUM  --  7.2*   < >  --  7.1* 7.1* 6.9* 7.2* 7.1* 7.2*  MG  --  1.6*   < >  --  2.1 2.3 2.4 2.7*  --  2.9*  PHOS 2.0* 2.9  --  2.4*  --   --   --   --  3.3 2.7   < > = values in this interval not displayed.    GFR: Estimated Creatinine Clearance: 42.9 mL/min (A) (by C-G formula based on SCr of 1.82 mg/dL (H)).  Liver Function Tests: Recent Labs  Lab 06/23/20 0657 06/24/20 7564 06/25/20 0441 06/26/20 0455 06/27/20 0608 06/28/20 0437  AST _1 48* 41  --   ALT _2 35 44  --   ALKPHOS 50 75 83 74 70  --   BILITOT 1.3* 0.8 0.9 0.7 0.7  --   PROT 5.9* 6.2* 6.7 6.3* 6.1*  --   ALBUMIN 2.7* 2.7* 2.6* 2.5* 2.4* 2.5*    CBG: Recent Labs  Lab 06/28/20 2225 06/29/20 0404 06/29/20 0736 06/29/20 1119 06/29/20 1539  GLUCAP 298* 211* 241* 193* 288*    Recent Results (from the past 240 hour(s))  Resp Panel by RT-PCR (Flu A&B, Covid) Nasopharyngeal Swab     Status: None   Collection Time: 07/11/2020  7:04 PM   Specimen: Nasopharyngeal Swab; Nasopharyngeal(NP) swabs in vial transport medium  Result Value Ref Range Status   SARS Coronavirus 2 by RT PCR NEGATIVE NEGATIVE Final    Comment: (NOTE) SARS-CoV-2 target nucleic acids are NOT DETECTED.  The SARS-CoV-2 RNA is generally detectable in upper respiratory specimens during the acute phase of infection. The lowest concentration of SARS-CoV-2 viral copies this assay can detect is 138 copies/mL. A negative result does not preclude SARS-Cov-2 infection and should not be used as the sole basis for treatment or other patient management  decisions. A negative result may occur with  improper specimen collection/handling, submission of specimen other than nasopharyngeal swab, presence of viral mutation(s) within the areas targeted by this assay, and inadequate number of viral copies(<138 copies/mL). A negative result must be combined with clinical observations, patient history, and epidemiological information. The expected result is Negative.  Fact Sheet for Patients:  EntrepreneurPulse.com.au  Fact Sheet for Healthcare Providers:  IncredibleEmployment.be  This test is no t yet approved or cleared by the [place] FDA and  has been authorized for detection and/or diagnosis of SARS-CoV-2 by FDA under an Emergency Use Authorization (EUA). This EUA will remain  in effect (meaning this test  can be used) for the duration of the COVID-19 declaration under Section 564(b)(1) of the Act, 21 U.S.C.section 360bbb-3(b)(1), unless the authorization is terminated  or revoked sooner.       Influenza A by PCR NEGATIVE NEGATIVE Final   Influenza B by PCR NEGATIVE NEGATIVE Final    Comment: (NOTE) The Xpert Xpress SARS-CoV-2/FLU/RSV plus assay is intended as an aid in the diagnosis of influenza from Nasopharyngeal swab specimens and should not be used as a sole basis for treatment. Nasal washings and aspirates are unacceptable for Xpert Xpress SARS-CoV-2/FLU/RSV testing.  Fact Sheet for Patients: EntrepreneurPulse.com.au  Fact Sheet for Healthcare Providers: IncredibleEmployment.be  This test is not yet approved or cleared by the [place] FDA and has been authorized for detection and/or diagnosis of SARS-CoV-2 by FDA under an Emergency Use Authorization (EUA). This EUA will remain in effect (meaning this test can be used) for the duration of the COVID-19 declaration under Section 564(b)(1) of the Act, 21 U.S.C. section 360bbb-3(b)(1), unless the  authorization is terminated or revoked.  Performed at [facility], [address] [place], [place] [address]   Culture, blood (single)     Status: None   Collection Time: 06/20/2020  8:28 PM   Specimen: BLOOD RIGHT HAND  Result Value Ref Range Status   Specimen Description BLOOD RIGHT HAND  Final   Special Requests   Final    BOTTLES DRAWN AEROBIC AND ANAEROBIC Blood Culture adequate volume   Culture   Final    NO GROWTH 5 DAYS Performed at [facility], [address] [place], [place] [address]    Report Status 06/26/2020 FINAL  Final  MRSA PCR Screening     Status: None   Collection Time: 06/22/20 12:29 AM   Specimen: Nasal Mucosa; Nasopharyngeal  Result Value Ref Range Status   MRSA by PCR NEGATIVE NEGATIVE Final    Comment:        The GeneXpert MRSA Assay (FDA approved for NASAL specimens only), is one component of a comprehensive MRSA colonization surveillance program. It is not intended to diagnose MRSA infection nor to guide or monitor treatment for MRSA infections. Performed at [facility], [address], [place], [place] [address]   Culture, blood (routine x 2)     Status: None   Collection Time: 06/22/20  6:00 AM   Specimen: Left Antecubital; Blood  Result Value Ref Range Status   Specimen Description LEFT ANTECUBITAL  Final   Special Requests   Final    BOTTLES DRAWN AEROBIC AND ANAEROBIC Blood Culture adequate volume   Culture   Final    NO GROWTH 6 DAYS Performed at [facility], [address], [place], [place] [address]    Report Status 06/28/2020 FINAL  Final  Culture, blood (routine x 2)     Status: None   Collection Time: 06/22/20  6:09 AM   Specimen: BLOOD LEFT HAND  Result Value Ref Range Status   Specimen Description BLOOD LEFT HAND  Final   Special Requests   Final    BOTTLES DRAWN AEROBIC AND ANAEROBIC Blood Culture adequate volume   Culture   Final    NO GROWTH 6 DAYS Performed at [facility], [address], [place], [place] [address]     Report Status 06/28/2020 FINAL  Final  Culture, Respiratory w Gram Stain     Status: None   Collection Time: 06/22/20  8:42 AM   Specimen: Tracheal Aspirate; Respiratory  Result Value Ref Range Status  Specimen Description   Final    TRACHEAL ASPIRATE Performed at [facility], [address], [place], [place] [address]    Special Requests   Final    Normal Performed at [facility], [address] [place], [place] [address]    Gram Stain   Final    RARE WBC PRESENT,BOTH PMN AND MONONUCLEAR FEW GRAM POSITIVE RODS FEW GRAM POSITIVE COCCI IN PAIRS IN CLUSTERS RARE GRAM NEGATIVE RODS Performed at [facility], [place] [address], [place], [place] [address]    Culture RARE PSEUDOMONAS AERUGINOSA  Final   Report Status 06/25/2020 FINAL  Final   Organism ID, Bacteria PSEUDOMONAS AERUGINOSA  Final      Susceptibility   Pseudomonas aeruginosa - MIC*    CEFTAZIDIME 4 SENSITIVE Sensitive     CIPROFLOXACIN <=0.25 SENSITIVE Sensitive     GENTAMICIN 4 SENSITIVE Sensitive     IMIPENEM 2 SENSITIVE Sensitive     PIP/TAZO 8 SENSITIVE Sensitive     CEFEPIME 2 SENSITIVE Sensitive     * RARE PSEUDOMONAS AERUGINOSA  Culture, BAL-quantitative w Gram Stain     Status: None (Preliminary result)   Collection Time: 06/28/20  1:16 PM   Specimen: Bronchoalveolar Lavage; Respiratory  Result Value Ref Range Status   Specimen Description   Final    BRONCHIAL ALVEOLAR LAVAGE Performed at [facility], [address] [place], [place] [address]    Special Requests   Final    NONE Performed at [facility], [address], [place], [place] [address]    Gram Stain   Final    NO WBC SEEN NO ORGANISMS SEEN Performed at [facility], [place] [employer] [address], [place], [place] [address]    Culture PENDING  Incomplete   Report Status PENDING  Incomplete  Culture, blood (Routine X 2) w Reflex to ID Panel     Status: None (Preliminary result)   Collection Time: 06/29/20 10:09 AM   Specimen: BLOOD  Result  Value Ref Range Status   Specimen Description BLOOD PIC LINE  Final   Special Requests   Final    Blood Culture adequate volume BOTTLES DRAWN AEROBIC AND ANAEROBIC Performed at [facility], [address], [place], [place] [address]    Culture PENDING  Incomplete   Report Status PENDING  Incomplete  Culture, blood (Routine X 2) w Reflex to ID Panel     Status: None (Preliminary result)   Collection Time: 06/29/20 10:11 AM   Specimen: BLOOD LEFT FOREARM  Result Value Ref Range Status   Specimen Description BLOOD LEFT FOREARM  Final   Special Requests   Final    Blood Culture adequate volume BOTTLES DRAWN AEROBIC AND ANAEROBIC Performed at [facility], [address], [place], [place] [address]    Culture PENDING  Incomplete   Report Status PENDING  Incomplete     Radiology Studies: DG Chest Port 1 View  Result Date: 06/29/2020 CLINICAL DATA:  Acute respiratory failure, ventilatory support EXAM: PORTABLE CHEST 1 VIEW COMPARISON:  None. FINDINGS: Endotracheal tube appears to be approximately 3 cm above the carina. Right PICC line tip proximal right atrium level. NG tube enters the stomach with the tip not visualized below the hemidiaphragms. Stable cardiomegaly with slight improvement in diffuse airspace disease versus edema. Right pleural effusion remains. No pneumothorax. Previous median sternotomy noted. Vascular stent graft noted. Degenerative changes of the spine. IMPRESSION: Stable support apparatus Cardiomegaly with improving airspace process versus edema. Similar right pleural effusion. Electronically Signed  By: Eugenie Filler M.D.   On: 06/29/2020 08:07   DG ABD ACUTE 2+V W 1V CHEST  Result Date: 06/28/2020 CLINICAL DATA:  Abdominal distension EXAM: DG ABDOMEN ACUTE WITH 1 VIEW CHEST COMPARISON:  None. FINDINGS: Nasogastric tube terminates in the left upper quadrant in the expected site of the stomach. No dilated loops of bowel to indicate ileus or obstruction. No suspicious  calcifications of the abdomen or pelvis. Diffuse bilateral airspace opacities. Aortic stent is noted. Endotracheal tube tip located approximately 3 cm of the carina. IMPRESSION: 1. Nonobstructive, nonspecific bowel gas pattern. 2. Diffuse bilateral lung opacities, worse on the right, similar to prior exam. Electronically Signed   By: Miachel Roux M.D.   On: 06/28/2020 07:52    Scheduled Meds: . chlorhexidine gluconate (MEDLINE KIT)  15 mL Mouth Rinse BID  . Chlorhexidine Gluconate Cloth  6 each Topical Daily  . docusate  100 mg Per Tube BID  . enoxaparin (LOVENOX) injection  40 mg Subcutaneous Q24H  . free water  100 mL Per Tube Q4H  . insulin aspart  0-20 Units Subcutaneous TID WC  . insulin aspart  0-5 Units Subcutaneous QHS  . insulin glargine  10 Units Subcutaneous Daily  . levothyroxine  50 mcg Per Tube Daily  . mouth rinse  15 mL Mouth Rinse 10 times per day  . methylPREDNISolone (SOLU-MEDROL) injection  40 mg Intravenous Daily  . pantoprazole (PROTONIX) IV  40 mg Intravenous Q12H  . polyethylene glycol  17 g Per Tube Daily  . sodium chloride flush  10-40 mL Intracatheter Q12H   Continuous Infusions: . sodium chloride Stopped (06/23/20 1400)  . ceFEPime (MAXIPIME) IV 2 g (06/29/20 1018)  . dexmedetomidine (PRECEDEX) IV infusion Stopped (06/28/20 2227)  . feeding supplement (VITAL AF 1.2 CAL) 1,000 mL (06/28/20 1532)  . norepinephrine (LEVOPHED) Adult infusion 7.5 mcg/min (06/29/20 1512)    LOS: 8 days   Roxan Hockey, MD How to contact the [facility] Attending or Consulting provider [place] or covering provider during after hours [place], for this patient?  1. Check the care team in [facility] and look for a) attending/consulting TRH provider listed and b) the [facility] team listed 2. Log into [email or web address] and use [place]'s universal password to access. If you do not have the password, please contact the hospital operator. 3. Locate the [facility] provider you are looking for under [facility] and page to a number that you can be directly reached. 4. If you still have difficulty reaching the provider, please page the [facility] (Director on Call) for the Hospitalists listed on amion for assistance.  06/29/2020, 6:12 PM

## 2020-06-29 NOTE — Progress Notes (Signed)
eLink Physician-Brief Progress Note Patient Name: Jay Skinner DOB: [birth year] MRN: [id number]   Date of Service  06/29/2020  HPI/Events of Note  Patient being treated for septic shock, Norepinephrine discontinued on 06/25/19 but patient now has soft blood pressures ( 87/44,  MAP 57 mmHg).  eICU Interventions  Norepinephrine gtt reordered.        Kerry Kass [name] 06/29/2020, 12:23 AM

## 2020-06-30 DIAGNOSIS — N179 Acute kidney failure, unspecified: Secondary | ICD-10-CM | POA: Diagnosis not present

## 2020-06-30 DIAGNOSIS — J9601 Acute respiratory failure with hypoxia: Secondary | ICD-10-CM | POA: Diagnosis not present

## 2020-06-30 DIAGNOSIS — Z515 Encounter for palliative care: Secondary | ICD-10-CM | POA: Diagnosis not present

## 2020-06-30 DIAGNOSIS — R579 Shock, unspecified: Secondary | ICD-10-CM | POA: Diagnosis not present

## 2020-06-30 DIAGNOSIS — J9602 Acute respiratory failure with hypercapnia: Secondary | ICD-10-CM | POA: Diagnosis not present

## 2020-06-30 DIAGNOSIS — Z7189 Other specified counseling: Secondary | ICD-10-CM | POA: Diagnosis not present

## 2020-06-30 LAB — URINALYSIS, ROUTINE W REFLEX MICROSCOPIC
Bilirubin Urine: NEGATIVE
Glucose, UA: 150 mg/dL — AB
Ketones, ur: NEGATIVE mg/dL
Leukocytes,Ua: NEGATIVE
Nitrite: NEGATIVE
Protein, ur: 30 mg/dL — AB
RBC / HPF: >50 RBC/hpf — ABNORMAL HIGH (ref 0–5)
Specific Gravity, Urine: 1.013 (ref 1.005–1.030)
pH: 5 (ref 5.0–8.0)

## 2020-06-30 LAB — CBC
HCT: 35.5 % — ABNORMAL LOW (ref 39.0–52.0)
Hemoglobin: 11 g/dL — ABNORMAL LOW (ref 13.0–17.0)
MCH: 32.6 pg (ref 26.0–34.0)
MCHC: 31 g/dL (ref 30.0–36.0)
MCV: 105.3 fL — ABNORMAL HIGH (ref 80.0–100.0)
Platelets: 158 10*3/uL (ref 150–400)
RBC: 3.37 MIL/uL — ABNORMAL LOW (ref 4.22–5.81)
RDW: 14.6 % (ref 11.5–15.5)
WBC: 10.8 10*3/uL — ABNORMAL HIGH (ref 4.0–10.5)
nRBC: 1.9 % — ABNORMAL HIGH (ref 0.0–0.2)

## 2020-06-30 LAB — BASIC METABOLIC PANEL
Anion gap: 11 (ref 5–15)
BUN: 110 mg/dL — ABNORMAL HIGH (ref 6–20)
CO2: 29 mmol/L (ref 22–32)
Calcium: 7.2 mg/dL — ABNORMAL LOW (ref 8.9–10.3)
Chloride: 99 mmol/L (ref 98–111)
Creatinine, Ser: 2.17 mg/dL — ABNORMAL HIGH (ref 0.61–1.24)
GFR, Estimated: 34 mL/min — ABNORMAL LOW (ref >60)
Glucose, Bld: 263 mg/dL — ABNORMAL HIGH (ref 70–99)
Potassium: 4.3 mmol/L (ref 3.5–5.1)
Sodium: 139 mmol/L (ref 135–145)

## 2020-06-30 LAB — GLUCOSE, CAPILLARY
Glucose-Capillary: 238 mg/dL — ABNORMAL HIGH (ref 70–99)
Glucose-Capillary: 256 mg/dL — ABNORMAL HIGH (ref 70–99)
Glucose-Capillary: 311 mg/dL — ABNORMAL HIGH (ref 70–99)
Glucose-Capillary: 280 mg/dL — ABNORMAL HIGH (ref 70–99)
Glucose-Capillary: 274 mg/dL — ABNORMAL HIGH (ref 70–99)

## 2020-06-30 LAB — PROCALCITONIN: Procalcitonin: 0.73 ng/mL

## 2020-06-30 MED ORDER — FLUCONAZOLE IN SODIUM CHLORIDE 200-0.9 MG/100ML-% IV SOLN
200.0000 mg | INTRAVENOUS | Status: AC
  Administered 2020-06-30: 200 mg via INTRAVENOUS
  Filled 2020-06-30: qty 100

## 2020-06-30 MED ORDER — FLUCONAZOLE 100MG IVPB
100.0000 mg | INTRAVENOUS | Status: DC
  Filled 2020-06-30 (×2): qty 50

## 2020-06-30 MED ORDER — NOREPINEPHRINE 16 MG/250ML-% IV SOLN
0.0000 ug/min | INTRAVENOUS | Status: DC
  Administered 2020-06-30: 25 ug/min via INTRAVENOUS
  Administered 2020-06-30: 24 ug/min via INTRAVENOUS
  Administered 2020-07-01: 23 ug/min via INTRAVENOUS
  Filled 2020-06-30 (×4): qty 250

## 2020-06-30 MED ORDER — VASOPRESSIN 20 UNITS/100 ML INFUSION FOR SHOCK
0.0000 [IU]/min | INTRAVENOUS | Status: DC
  Administered 2020-06-30 – 2020-07-01 (×2): 0.03 [IU]/min via INTRAVENOUS
  Filled 2020-06-30 (×3): qty 100

## 2020-06-30 NOTE — Progress Notes (Signed)
Nutrition Follow-up  DOCUMENTATION CODES:   Not applicable  INTERVENTION:  Continue Vital AF 1.2@ 22ml/hr via OGT (1440 ml/ day)  Tube feeding regimen provides1728kcal (95% of needs),108grams of protein, and 1121ml of H2O.    NUTRITION DIAGNOSIS:   Inadequate oral intake related to inability to eat as evidenced by NPO status.  -addressed with enteral nutrition support  GOAL:   Patient will meet greater than or equal to 90% of their needs   -Met  MONITOR:   Vent status,Labs,Weight trends,TF tolerance,Skin,I & O's  ASSESSMENT:   Jay Skinner is a 60 y.o. male with medical history significant for completely repaired tetralogy of Fallot , persistent atrial fibrillation , diabetes mellitus, GI bleed, hypertension, chronic respiratory failure on 2 L, paralyzed right diaphragm.  Patient remains intubated on ventilator and yesterday pressor support resumed. Pitting edema BLE.Poor urine output. Glucose, BUN and Creat. Trending up.  Needs recalculated based on 72 kg.   MV: 6.6 L/min Temp (24hrs), Avg:100.3 F (37.9 C), Min:98.1 F (36.7 C), Max:101.5 F (38.6 C)  Off sedation but slow to wake up. MD has discussed with family. They would like to give pt a few more days for opportunity to improve.  I/O- net +9.3 liters since admission  UOP-2. 375 ml since admssion  Labs: BMP Latest Ref Rng & Units 06/30/2020 06/29/2020 06/28/2020  Glucose 70 - 99 mg/dL 263(H) 249(H) 324(H)  BUN 6 - 20 mg/dL 110(H) 101(H) 95(H)  Creatinine 0.61 - 1.24 mg/dL 2.17(H) 1.82(H) 1.75(H)  Sodium 135 - 145 mmol/L 139 139 137  Potassium 3.5 - 5.1 mmol/L 4.3 4.1 4.8  Chloride 98 - 111 mmol/L 99 100 99  CO2 22 - 32 mmol/L $RemoveB'29 27 26  'TBqIAbeo$ Calcium 8.9 - 10.3 mg/dL 7.2(L) 7.2(L) 7.1(L)     Diet Order:   Diet Order            Diet NPO time specified  Diet effective now                 EDUCATION NEEDS:   Not appropriate for education at this time  Skin:  Skin Assessment: Reviewed RN  Assessment     BLE edema observed-by RD  Last BM:  4/13 type 7   Height:   Ht Readings from Last 1 Encounters:  06/30/20 $RemoveB'5\' 4"'iXeuiUYB$  (1.626 m)    Weight:   Wt Readings from Last 1 Encounters:  06/30/20 84.7 kg  Admission wt of 72 kg / 06/24/20 -wt  82.6 kg  Ideal Body Weight:  59.1 kg  BMI:  Body mass index is 32.05 kg/m.  Estimated Nutritional Needs:   Kcal:  1733  Protein:  108--115 gr  Fluid:  per MD goal   Colman Cater MS,RD,CSG,LDN Contact: Shea Evans

## 2020-06-30 NOTE — Progress Notes (Signed)
Pharmacy Antibiotic Note  Jay Skinner is a 60 y.o. male admitted on 07/16/2020 with UTIinfection.  Pharmacy has been consulted for Diflucan dosing. CrCl 36ms/min, dose adjusted.   Plan: Diflucan 2012mIV today, then 10049maily (total 14 days) F/U resolution of candiduria Monitor V/S, labs  Height: _0  (162.6 cm) Weight: 84.7 kg (186 lb 11.7 oz) IBW/kg (Calculated) : 59.2  Temp (24hrs), Avg:100.3 F (37.9 C), Min:98.1 F (36.7 C), Max:101.5 F (38.6 C)  Recent Labs  Lab 06/25/20 0441 06/26/20 0455 06/27/20 0608 06/28/20 0436 06/28/20 0437 06/29/20 0425 06/30/20 0609  WBC 6.7 7.9 7.9 7.5  --   --  10.8*  CREATININE 1.06 1.56* 1.81*  --  1.75* 1.82* 2.17*    Estimated Creatinine Clearance: 36 mL/min (A) (by C-G formula based on SCr of 2.17 mg/dL (H)).    Allergies  Allergen Reactions  . Codeine Hives  . Lasix [Furosemide] Hives  . Latex Other (See Comments)  . Penicillins Dermatitis    Antimicrobials this admission: Vancomycin 4/5 >> 4/8 Cefepime 4/5 >>  Fluconazole 4/13> x 14 xdays   Microbiology results: 4/5 BCx: ng 4/5 tracheal aspirate Cx: GPRand GPC on gram stain. Final cx +pseudomonas- pan sensitive 4/11 BAL cx; no org seen 4/12 BCX: ngtd  MRSA PCR: neg  Thank you for allowing pharmacy to be a part of this patient's care. LorIsac SarnaS PhaVena AustriaCPCaliforniainical Pharmacist Pager #[phone]/2022 1:06 PM

## 2020-06-30 NOTE — Progress Notes (Signed)
NAME:  Jay Skinner, MRN:  [id number], DOB:  05/06/1960, LOS: 9 ADMISSION DATE:  07/16/2020, CONSULTATION DATE:  4/5 REFERRING MD:  Donia Ast CHIEF COMPLAINT:  Acute on chronic hypoxemic/hypercarbic Resp failure  Brief patient profile:    History of Present Illness:   60 y.o. male with medical history significant for completely repaired tetralogy of Fallot , persistent atrial fibrillation , diabetes mellitus, GI bleed, hypertension, chronic respiratory failure on 2 L, paralyzed right diaphragm. History is obtained from chart review as at the time of evaluation patient is intubated.  Patient was brought to the ED via EMS.  Patient was recently discharged from [facility] after admission 3/24-3/27 for GI bleed.  Since discharge to home, patient has steadily declined.  Today patient's brother found patient sitting in his chair, sleeping and drooling.  At baseline he is alert and conversant.  Family also reported that patient has had a cough over the past several days.  Hospitalization here at [facility] 3/17 - 3/18-for acute upper GI bleed and acute gastroenteritis.  EGD was deferred for patient to follow-up with his gastroenterologist at [facility].  Hemoglobin remained stable at 11.8 during admission.  He was advised to stop taking his apixaban until he followed up with GI.  ED Course:  Heart rate 67s to 80s.  resp rate 24-32.  Blood pressure systolic 100-174.  O2 sats 91 to 97% on 6 L.  Venous blood gas showed pH of 7.1, VCO2 greater than 120.  BNP 368.  Lactic acid 1.1.  Initial portable chest x-ray showed-complete opacification of the right hemithorax, perihilar infiltration on the left.  Post intubation shunt chest x-ray shows significant improvement in   aeration of right lung.  Patient was initially tried on BiPAP and subsequently intubated. ED provider talked to critical care, okay with admission at [facility].  Pertinent  Medical History  Arthritis Asthma Heart disease sp   completely repaired tetralogy of Fallot History of bleeding ulcers Hypertension Hypothyroidism  Significant Hospital Events: Including procedures, antibiotic start and stop dates in addition to other pertinent events   . Resp viral panel 4/4 > neg covid/ flu  . BC x 1  4/4 >>>ng . ET 4/4 >> . BC x 2 4/5 >>>ng . MRSA PCR 4/5>  Neg . Resp culture/et 4/5 >>>  rare pseudomonaas . Maxepime 4/6 >>> . Vanc IV 4/6  >  4/7  EEG  4/5 >>> moderate to severe diffuse encephalopathy, nonspecific etiology. No seizures or epileptiform discharges were seen throughout the recording. Marland Kitchen [place] R chest 4/6 > not enough effusion to tap  . R PIC 4/6 >>> . ESR  4/7  = 107  > solumedrol 60 q 8 and d/c'd amiodarone . 4/11 bronchoscopy >> thick secretions suctioned out of airways . 4/12 Started back on Levophed for soft blood pressure.   Scheduled Meds: . chlorhexidine gluconate (MEDLINE KIT)  15 mL Mouth Rinse BID  . Chlorhexidine Gluconate Cloth  6 each Topical Daily  . docusate  100 mg Per Tube BID  . enoxaparin (LOVENOX) injection  40 mg Subcutaneous Q24H  . free water  100 mL Per Tube Q4H  . insulin aspart  0-20 Units Subcutaneous TID WC  . insulin aspart  0-5 Units Subcutaneous QHS  . insulin glargine  10 Units Subcutaneous Daily  . levothyroxine  50 mcg Per Tube Daily  . mouth rinse  15 mL Mouth Rinse 10 times per day  . methylPREDNISolone (SOLU-MEDROL) injection  40 mg  Intravenous Daily  . pantoprazole (PROTONIX) IV  40 mg Intravenous Q12H  . polyethylene glycol  17 g Per Tube Daily  . sodium chloride flush  10-40 mL Intracatheter Q12H   Continuous Infusions: . sodium chloride Stopped (06/23/20 1400)  . ceFEPime (MAXIPIME) IV 2 g (06/30/20 0823)  . feeding supplement (VITAL AF 1.2 CAL) 1,000 mL (06/28/20 1532)  . norepinephrine (LEVOPHED) Adult infusion 25 mcg/min (06/30/20 0135)  . vasopressin     PRN Meds:.acetaminophen **OR** acetaminophen, albuterol, fentaNYL (SUBLIMAZE) injection,  fentaNYL (SUBLIMAZE) injection, naphazoline-pheniramine, polyethylene glycol, sodium chloride flush    Interim History / Subjective:   Remains critically ill, on increasing doses of Levophed. Remains intubated, tolerated pressure support 15/5 wean for 8 hours yesterday. Urine output is good Persistently febrile last 24 hours   Objective   Blood pressure (!) 86/65, pulse 90, temperature (!) 101 F (38.3 C), temperature source Rectal, resp. rate (!) 9, height $RemoveB'5\' 4"'wuXUejwX$  (1.626 m), weight 84.7 kg, SpO2 98 %. CVP:  [17 mmHg-21 mmHg] 19 mmHg  Vent Mode: PRVC FiO2 (%):  [40 %] 40 % Set Rate:  [14 bmp] 14 bmp Vt Set:  [470 mL] 470 mL PEEP:  [5 cmH20] 5 cmH20 Pressure Support:  [5 cmH20] 5 cmH20 Plateau Pressure:  [25 cmH20-30 cmH20] 28 cmH20   Intake/Output Summary (Last 24 hours) at 06/30/2020 5631 Last data filed at 06/30/2020 4970 Gross per 24 hour  Intake 1364 ml  Output 2375 ml  Net -1011 ml   Filed Weights   06/28/20 0500 06/29/20 0428 06/30/20 0411  Weight: 87 kg 84.8 kg 84.7 kg   CVP:  [17 mmHg-21 mmHg] 19 mmHg   Examination:  Chronically ill-appearing, no distress, off sedation No jvd Oropharynx  ET /og Neck supple Lungs  no accessory muscle use, decreased breath sounds on right RRR no s3 or or significant  murmur Abd mod distended good bowel sounds, nontender Ext warm 2+bilateral ext  edema   Neuro -nods head to name , follows one-step commands, grip my left hand, appears weak and deconditioned   Chest x-ray 4/12 independently reviewed, ET tube in position, some improved aeration right lower lobe  Labs show normal electrolytes, hyperglycemia, creatinine and BUN continue to rise, mild leukocytosis  Resolved Hospital Problem list      Assessment & Plan:  1) Acute on chronic hypercabic and hypoxemic Resp failure with post hypercapneic met alkalosis -Limited progress with weaning due to deconditioning, fluid, abdominal distention, right hemidiaphragm  paralysis -Improved right lower lobe atelectasis post bronchoscopy  -Unfortunately progress with weaning seems to have stalled due to worsening shock     2) Prob HCAP vs asp pna on R in pt with chronically elevated RHD ? Etiology  -Pseudomonas HAP -Continue cefepime for 10 days total -Doubt amiodarone toxicity here, acute illness can cause increased ESR, discontinue steroids   3) Circulatory shock , off Levophed 4/7, restarted 4/12 - echo suggest RV issues probably related to tetralogy of Fallot/tricuspid repair/pulmonary valve placement 2021 -Pressor requirements increasing, fevers suggest that this may be septic shock -check procalcitonin, if low have to consider noninfectious cause of fever and RV dysfunction -Repeat blood cultures negative so doubt endocarditis, UA does not show significant white cells    5) AKI secondary to overdiuresis versus sepsis, 10 L positive balance Lab Results  Component Value Date   CREATININE 2.17 (H) 06/30/2020   CREATININE 1.82 (H) 06/29/2020   CREATININE 1.75 (H) 06/28/2020   -Really needs diuresis but holding off while creatinine  is increasing  Summary -unfortunately Jamorion's condition has worsened with increasing pressor requirements and weaning has stalled and renal function is worse.  I am not as optimistic of a favorable outcome here in terms of extubation.  Per discussion with the family would like to wait for a few more days until next Monday and then think about extubation to comfort   Best practice (right click and "Reselect all SmartList Selections" daily)  Diet:  Tube Feed  Pain/Anxiety/Delirium protocol (if indicated):   int fent, goal RASS 0 VAP protocol (if indicated): Yes DVT prophylaxis: LMWH and SCD GI prophylaxis: PPI Glucose control:  SSI No Central venous access:  Yes, and it is still needed Arterial line:  N/A Foley:  N/A Mobility:  OOB  PT consulted: N/A Last date of multidisciplinary goals of care discussion 4/11  brother Sonia Side and sister, palliative care RN Elmo Putt present , Shmuel plays the harmonica, he would never want to be in a nursing home with a tracheostomy, no reintubation no tracheostomy Code Status:  DNR Disposition: ICU   Labs   CBC: Recent Labs  Lab 06/24/20 0637 06/25/20 0441 06/26/20 0455 06/27/20 0608 06/28/20 0436 06/30/20 0609  WBC 8.3 6.7 7.9 7.9 7.5 10.8*  NEUTROABS 6.6 6.2 6.4 6.6  --   --   HGB 10.2* 10.7* 10.1* 10.3* 10.4* 11.0*  HCT 33.4* 35.5* 32.9* 34.0* 33.5* 35.5*  MCV 109.2* 111.3* 111.5* 110.0* 107.4* 105.3*  PLT 110* 100* 121* 111* 112* 578    Basic Metabolic Panel: Recent Labs  Lab 06/23/20 2121 06/24/20 0637 06/24/20 4696 06/25/20 0441 06/26/20 0455 06/27/20 0608 06/28/20 0437 06/29/20 0425 06/30/20 0609  NA  --  140   < > 139 138 139 137 139 139  K  --  4.6   < > 5.3* 5.1 5.4* 4.8 4.1 4.3  CL  --  97*   < > 98 98 98 99 100 99  CO2  --  35*   < > $R'31 30 28 26 27 29  'af$ GLUCOSE  --  154*   < > 237* 298* 307* 324* 249* 263*  BUN  --  26*   < > 30* 55* 78* 95* 101* 110*  CREATININE  --  1.03   < > 1.06 1.56* 1.81* 1.75* 1.82* 2.17*  CALCIUM  --  7.1*   < > 7.1* 6.9* 7.2* 7.1* 7.2* 7.2*  MG  --  2.1  --  2.3 2.4 2.7*  --  2.9*  --   PHOS 2.4*  --   --   --   --   --  3.3 2.7  --    < > = values in this interval not displayed.   GFR: Estimated Creatinine Clearance: 36 mL/min (A) (by C-G formula based on SCr of 2.17 mg/dL (H)). Recent Labs  Lab 06/24/20 0637 06/25/20 0441 06/26/20 0455 06/27/20 0608 06/28/20 0436 06/30/20 0609  PROCALCITON 0.49  --   --   --   --   --   WBC 8.3   < > 7.9 7.9 7.5 10.8*   < > = values in this interval not displayed.    Liver Function Tests: Recent Labs  Lab 06/24/20 0637 06/25/20 0441 06/26/20 0455 06/27/20 0608 06/28/20 0437  AST 17 16 48* 41  --   ALT 9 11 35 44  --   ALKPHOS 75 83 74 70  --   BILITOT 0.8 0.9 0.7 0.7  --   PROT 6.2* 6.7 6.3* 6.1*  --  ALBUMIN 2.7* 2.6* 2.5* 2.4* 2.5*   No  results for input(s): LIPASE, AMYLASE in the last 168 hours. No results for input(s): AMMONIA in the last 168 hours.  ABG    Component Value Date/Time   PHART 7.520 (H) 06/22/2020 1337   PCO2ART 57.3 (H) 06/22/2020 1337   PO2ART 169 (H) 06/22/2020 1337   HCO3 44.6 (H) 06/22/2020 1337   ACIDBASEDEF NOT CALCULATED 07/12/2020 2030   O2SAT 99.1 06/22/2020 1337     Coagulation Profile: No results for input(s): INR, PROTIME in the last 168 hours.  Cardiac Enzymes: No results for input(s): CKTOTAL, CKMB, CKMBINDEX, TROPONINI in the last 168 hours.  HbA1C: No results found for: HGBA1C  CBG: Recent Labs  Lab 06/29/20 1539 06/29/20 2003 06/29/20 2221 06/30/20 0411 06/30/20 0752  GLUCAP 288* 289* 285* 238* 256*      The patient is critically ill with multiple organ systems failure and requires high complexity decision making for assessment and support, frequent evaluation and titration of therapies, application of advanced monitoring technologies and extensive interpretation of multiple databases. Critical Care Time devoted to patient care services described in this note independent of APP/resident  time is 32 minutes.     Kara Mead MD. Shade Flood. [facility] Pager : [phone]  If no response to pager , please call [phone] until 7 pm After 7:00 pm call Elink  [phone]   06/30/2020

## 2020-06-30 NOTE — Progress Notes (Signed)
Inpatient Diabetes Program Recommendations  AACE/ADA: New Consensus Statement on Inpatient Glycemic Control (2015)  Target Ranges:  Prepandial:   less than 140 mg/dL      Peak postprandial:   less than 180 mg/dL (1-2 hours)      Critically ill patients:  140 - 180 mg/dL   Lab Results  Component Value Date   GLUCAP 311 (H) 06/30/2020    Review of Glycemic Control  CBGs 238, 256, 311 mg/dL.  Current orders for Inpatient glycemic control: Lantus 10 units QD, Novolog 0-20 units TID with meals and 0-5 units HS  On vent.  Inpatient Diabetes Program Recommendations:     Increase Lantus to 12 units QD Change Novolog to 0-20 units Q4H Add Novolog 3 units Q4H  Continue to follow.  Thank you. Lorenda Peck, RD, LDN, CDE Inpatient Diabetes Coordinator [phone]

## 2020-06-30 NOTE — Progress Notes (Addendum)
PROGRESS NOTE   Jay Skinner  RVU:[id number] DOB: Dec 18, 1960 DOA: 07/15/2020 PCP: Sharilyn Sites, MD   Chief Complaint  Patient presents with  . Shortness of Breath   Level of care: ICU  Brief Admission History:  60 y.o. male with medical history significant for completely repaired tetralogy of Fallot , persistent atrial fibrillation , diabetes mellitus, GI bleed, hypertension, chronic respiratory failure on 2 L, paralyzed right diaphragm. History is obtained from chart review as at the time of evaluation patient is intubated.  Pt was brought to the ED via EMS.  Patient was recently discharged from [facility] after admission 3/24-3/27 for GI bleed.  Since discharge to home, patient has steadily declined.  Patient's brother found patient sitting in his chair, sleeping and drooling.  At baseline he is alert and conversant.  Family also reported that patient has had a cough over the past several days.  He was admitted with circulatory shock likely sepsis, pneumonia, acute respiratory failure.    Assessment & Plan:   Principal Problem:   Acute respiratory failure with hypoxia and hypercapnia (HCC) Active Problems:   Gastrointestinal hemorrhage associated with gastric ulcer   HTN (hypertension)   Persistent atrial fibrillation (HCC)   Tetralogy of Fallot   Type 2 diabetes mellitus (HCC)   Shock circulatory (HCC)   Metabolic alkalosis with respiratory acidosis   Atelectasis, right  A/p 1)Severe sepsis with septic shock secondary to community-acquired pneumonia --Patient met sepsis criteria with fevers, tachypnea, leukocytosis and acute hypoxic respiratory failure with persistent hypotension as well -Patient was initially weaned off Levophed as of 06/24/2020, back on Levophed as of 06/29/2020 -Elevated PCT 0.49 >> 0.73 -WBC 7.5 >> 10.8  -Continue IV cefepime   IV vancomycin discontinued on 06/26/2018 -Status post bronchoscopy on 06/28/2020 -06/30/20 -Patient with persistent fevers  requiring cooling blanket -WBC and PCT trending up -UA with yeast-Diflucan added -Unable to come off Levophed, vasopressin added by Dr. Elsworth Soho - -Antimicrobials this admission: Vancomycin 4/5 >> 4/8 Cefepime 4/5 >>  -Diflucan 06/30/2020  Microbiology results: 4/5BCx: ngtd 4/5tracheal aspirate SH:[id number] MRSA PCR: neg Repeat BAL/Tracheal Aspirate from 06/28/20 NGTD -Repeat blood cultures 06/29/2020 NGTD  2)Acute respiratory failure with hypoxia and hypercapnia---cannot rule out amiodarone induced lung toxicity-- -Remains intubated  -PCCM consult noted --Status post bronchoscopy on 06/28/2020 -ESR is 107--- cannot rule out amiodarone induced lung toxicity -PTA patient was on amiodarone 400 mg daily, last dose was 06/24/2020 -Tracheal aspirate from 06/22/20 with Pseudomonas--currently on cefepime -started IV solu-medrol on 06/24/2020, solu- Medrol discontinued on 06/29/2020 -Patient remains very lethargic despite being off propofol and off Precedex drips  3)Right pleural Effusion/possible pulmonary edema -Right-sided ultrasound does not demonstrate a pocket that is big enough to allow for safe thoracentesis  4)Persistent atrial fibrillation  -Stopped amiodarone on 06/24/2020-due to concerns about possible amiodarone toxicity as noted above - his apixaban had been on hold temporarily for recent GI bleeding  5)Chronic diastolic right sided heart failure - Hold bumex and spironolactone temporarily due to sepsis pathophysiology with hypotension and aki - Follow I/O, weights, electrolytes  6)History of recent GI bleeding - IV protonix 40 mg BID for GI protection ordered  7)Type 2 DM  -Anticipate worsening glycemic control with initiation of IV steroids for amiodarone toxicity -Started Lantus 10 units daily Use Novolog/Humalog Sliding scale insulin with Accu-Cheks/Fingersticks as ordered   8)Repaired Tetralogy of Fallot - Followed by Dr. Alycia Rosset[facility] at [facility]  9)FEN/hyperkalemia--  -Tolerating tube feed and water flushes well via OG tube, potassium normalized after  Lokelma   10)Hypothyroidism--continue levothyroxine  11)AKI----acute kidney injury - creatinine 2.17 (peak)  from 1.0--suspect due to diuresis -Urine output is improving -Diuretics were discontinued renally adjust medications, avoid nephrotoxic agents / dehydration  / hypotension  12)Social/Ethics--palliative care consult appreciated patient is a DNR, with full scope of treatment -Overall prognosis is very poor  13) acute metabolic encephalopathy-- -Patient remains very lethargic despite being off propofol and off Precedex drips  -Prophylaxis-Protonix for GI prophylaxis  Lovenox/SCDs for DVT prophylaxis  CRITICAL CARE Performed by: Roxan Hockey   Total critical care time: 45 minutes  Critical care time was exclusive of separately billable procedures and treating other patients.  Critical care was necessary to treat or prevent imminent or life-threatening deterioration. -Remains critically ill, intubated  Vent Settings:- PRVC/40%/5/15/470 -Back on Levophed/vasopressin  Critical care was time spent personally by me on the following activities: development of treatment plan with patient and/or surrogate as well as nursing, discussions with consultants, evaluation of patient's response to treatment, examination of patient, obtaining history from patient or surrogate, ordering and performing treatments and interventions, ordering and review of laboratory studies, ordering and review of radiographic studies, pulse oximetry and re-evaluation of patient's condition.   DVT prophylaxis: SCD/Lovenox prophylactic dose Code Status: Full  Family Communication: Discussed with brothers Disposition: TBD Status is: Inpatient  Remains inpatient appropriate because:-Remains critically ill,  intubated and sedated   Dispo: The patient is from: Home              Anticipated d/c is to: TBD               Patient currently is not medically stable to d/c.   Difficult to place patient No  Consultants:   PCCM critical care  Procedures:  -Intubation 07/06/2020 -Status post bronchoscopy on 06/28/2020  Antimicrobials:  Cefepime 4/5>> Vancomycin 4/5>>   Subjective: -Remains critically ill,  - -06/30/20 -Patient with persistent fevers requiring cooling blanket -WBC and PCT trending up -UA with yeast -Unable to come off Levophed, vasopressin added by Dr. Elsworth Soho --Patient remains very lethargic despite being off propofol and off Precedex drips vent Settings:- PRVC/40%/5/15/470 -2 brothers at bedside, plan discussed, questions answered  Objective: Vitals:   06/30/20 0824 06/30/20 0900 06/30/20 1000 06/30/20 1109  BP:  110/82 121/68   Pulse:  87 84 76  Resp:  _0 Temp:  99.4 F (37.4 C)  98.1 F (36.7 C)  TempSrc:  Rectal  Rectal  SpO2:  97% 97% 93%  Weight:      Height: _1  (1.626 m)       Intake/Output Summary (Last 24 hours) at 06/30/2020 1255 Last data filed at 06/30/2020 1129 Gross per 24 hour  Intake 1706.08 ml  Output 2375 ml  Net -668.92 ml   Filed Weights   06/28/20 0500 06/29/20 0428 06/30/20 0411  Weight: 87 kg 84.8 kg 84.7 kg    Examination:  General exam: intubated on vent,   HEENT-ET and OG tubes Respiratory system: Fair bilateral air movement, no wheezing Cardiovascular system: normal S1 & S2 heard.  Prior sternotomy scar Gastrointestinal system: Abdomen is nondistended, soft . Normal bowel sounds heard. Central nervous system: Lethargic. No gross focal neurological deficits. Extremities: no cyanosis seen. Skin: No gross lesions seen.  Psychiatry: unable to assess.  MSK--right arm PICC line GU-Foley cath in situ with clear urine  Data Reviewed: I have personally reviewed following labs and imaging studies  CBC: Recent Labs  Lab 06/24/20 0637 06/25/20 0441 06/26/20 0455  06/27/20 0608 06/28/20 0436 06/30/20 0609  WBC 8.3 6.7 7.9 7.9 7.5  10.8*  NEUTROABS 6.6 6.2 6.4 6.6  --   --   HGB 10.2* 10.7* 10.1* 10.3* 10.4* 11.0*  HCT 33.4* 35.5* 32.9* 34.0* 33.5* 35.5*  MCV 109.2* 111.3* 111.5* 110.0* 107.4* 105.3*  PLT 110* 100* 121* 111* 112* 836    Basic Metabolic Panel: Recent Labs  Lab 06/23/20 2121 06/24/20 0637 06/24/20 6294 06/25/20 0441 06/26/20 0455 06/27/20 0608 06/28/20 0437 06/29/20 0425 06/30/20 0609  NA  --  140   < > 139 138 139 137 139 139  K  --  4.6   < > 5.3* 5.1 5.4* 4.8 4.1 4.3  CL  --  97*   < > 98 98 98 99 100 99  CO2  --  35*   < > _0 GLUCOSE  --  154*   < > 237* 298* 307* 324* 249* 263*  BUN  --  26*   < > 30* 55* 78* 95* 101* 110*  CREATININE  --  1.03   < > 1.06 1.56* 1.81* 1.75* 1.82* 2.17*  CALCIUM  --  7.1*   < > 7.1* 6.9* 7.2* 7.1* 7.2* 7.2*  MG  --  2.1  --  2.3 2.4 2.7*  --  2.9*  --   PHOS 2.4*  --   --   --   --   --  3.3 2.7  --    < > = values in this interval not displayed.    GFR: Estimated Creatinine Clearance: 36 mL/min (A) (by C-G formula based on SCr of 2.17 mg/dL (H)).  Liver Function Tests: Recent Labs  Lab 06/24/20 0637 06/25/20 0441 06/26/20 0455 06/27/20 0608 06/28/20 0437  AST 17 16 48* 41  --   ALT 9 11 35 44  --   ALKPHOS 75 83 74 70  --   BILITOT 0.8 0.9 0.7 0.7  --   PROT 6.2* 6.7 6.3* 6.1*  --   ALBUMIN 2.7* 2.6* 2.5* 2.4* 2.5*    CBG: Recent Labs  Lab 06/29/20 2003 06/29/20 2221 06/30/20 0411 06/30/20 0752 06/30/20 1108  GLUCAP 289* 285* 238* 256* 311*    Recent Results (from the past 240 hour(s))  Resp Panel by RT-PCR (Flu A&B, Covid) Nasopharyngeal Swab     Status: None   Collection Time: 07/07/2020  7:04 PM   Specimen: Nasopharyngeal Swab; Nasopharyngeal(NP) swabs in vial transport medium  Result Value Ref Range Status   SARS Coronavirus 2 by RT PCR NEGATIVE NEGATIVE Final    Comment: (NOTE) SARS-CoV-2 target nucleic acids are NOT DETECTED.  The SARS-CoV-2 RNA is generally detectable in upper respiratory specimens  during the acute phase of infection. The lowest concentration of SARS-CoV-2 viral copies this assay can detect is 138 copies/mL. A negative result does not preclude SARS-Cov-2 infection and should not be used as the sole basis for treatment or other patient management decisions. A negative result may occur with  improper specimen collection/handling, submission of specimen other than nasopharyngeal swab, presence of viral mutation(s) within the areas targeted by this assay, and inadequate number of viral copies(<138 copies/mL). A negative result must be combined with clinical observations, patient history, and epidemiological information. The expected result is Negative.  Fact Sheet for Patients:  EntrepreneurPulse.com.au  Fact Sheet for Healthcare Providers:  IncredibleEmployment.be  This test is no t yet approved or cleared by the [place] FDA and  has  been authorized for detection and/or diagnosis of SARS-CoV-2 by FDA under an Emergency Use Authorization (EUA). This EUA will remain  in effect (meaning this test can be used) for the duration of the COVID-19 declaration under Section 564(b)(1) of the Act, 21 U.S.C.section 360bbb-3(b)(1), unless the authorization is terminated  or revoked sooner.       Influenza A by PCR NEGATIVE NEGATIVE Final   Influenza B by PCR NEGATIVE NEGATIVE Final    Comment: (NOTE) The Xpert Xpress SARS-CoV-2/FLU/RSV plus assay is intended as an aid in the diagnosis of influenza from Nasopharyngeal swab specimens and should not be used as a sole basis for treatment. Nasal washings and aspirates are unacceptable for Xpert Xpress SARS-CoV-2/FLU/RSV testing.  Fact Sheet for Patients: EntrepreneurPulse.com.au  Fact Sheet for Healthcare Providers: IncredibleEmployment.be  This test is not yet approved or cleared by the [place] FDA and has been authorized for detection  and/or diagnosis of SARS-CoV-2 by FDA under an Emergency Use Authorization (EUA). This EUA will remain in effect (meaning this test can be used) for the duration of the COVID-19 declaration under Section 564(b)(1) of the Act, 21 U.S.C. section 360bbb-3(b)(1), unless the authorization is terminated or revoked.  Performed at [facility], [address] [place], [place] [address]   Culture, blood (single)     Status: None   Collection Time: 07/08/2020  8:28 PM   Specimen: BLOOD RIGHT HAND  Result Value Ref Range Status   Specimen Description BLOOD RIGHT HAND  Final   Special Requests   Final    BOTTLES DRAWN AEROBIC AND ANAEROBIC Blood Culture adequate volume   Culture   Final    NO GROWTH 5 DAYS Performed at [facility], [address] [place], [place] [address]    Report Status 06/26/2020 FINAL  Final  MRSA PCR Screening     Status: None   Collection Time: 06/22/20 12:29 AM   Specimen: Nasal Mucosa; Nasopharyngeal  Result Value Ref Range Status   MRSA by PCR NEGATIVE NEGATIVE Final    Comment:        The GeneXpert MRSA Assay (FDA approved for NASAL specimens only), is one component of a comprehensive MRSA colonization surveillance program. It is not intended to diagnose MRSA infection nor to guide or monitor treatment for MRSA infections. Performed at [facility], [address] [place], [place] [address]   Culture, blood (routine x 2)     Status: None   Collection Time: 06/22/20  6:00 AM   Specimen: Left Antecubital; Blood  Result Value Ref Range Status   Specimen Description LEFT ANTECUBITAL  Final   Special Requests   Final    BOTTLES DRAWN AEROBIC AND ANAEROBIC Blood Culture adequate volume   Culture   Final    NO GROWTH 6 DAYS Performed at [facility], [address] [place], [place] [address]    Report Status 06/28/2020 FINAL  Final  Culture, blood (routine x 2)     Status: None   Collection Time: 06/22/20  6:09 AM   Specimen: BLOOD LEFT HAND  Result Value  Ref Range Status   Specimen Description BLOOD LEFT HAND  Final   Special Requests   Final    BOTTLES DRAWN AEROBIC AND ANAEROBIC Blood Culture adequate volume   Culture   Final    NO GROWTH 6 DAYS Performed at [facility], [address] [place], [place] [address]    Report Status 06/28/2020 FINAL  Final  Culture, Respiratory w Gram Stain  Status: None   Collection Time: 06/22/20  8:42 AM   Specimen: Tracheal Aspirate; Respiratory  Result Value Ref Range Status   Specimen Description   Final    TRACHEAL ASPIRATE Performed at [facility], [address], [place], [place] [address]    Special Requests   Final    Normal Performed at [facility], [address] [place], [place] [address]    Gram Stain   Final    RARE WBC PRESENT,BOTH PMN AND MONONUCLEAR FEW GRAM POSITIVE RODS FEW GRAM POSITIVE COCCI IN PAIRS IN CLUSTERS RARE GRAM NEGATIVE RODS Performed at [facility], [place] [address], [place], [place] [address]    Culture RARE PSEUDOMONAS AERUGINOSA  Final   Report Status 06/25/2020 FINAL  Final   Organism ID, Bacteria PSEUDOMONAS AERUGINOSA  Final      Susceptibility   Pseudomonas aeruginosa - MIC*    CEFTAZIDIME 4 SENSITIVE Sensitive     CIPROFLOXACIN <=0.25 SENSITIVE Sensitive     GENTAMICIN 4 SENSITIVE Sensitive     IMIPENEM 2 SENSITIVE Sensitive     PIP/TAZO 8 SENSITIVE Sensitive     CEFEPIME 2 SENSITIVE Sensitive     * RARE PSEUDOMONAS AERUGINOSA  Culture, BAL-quantitative w Gram Stain     Status: None (Preliminary result)   Collection Time: 06/28/20  1:16 PM   Specimen: Bronchoalveolar Lavage; Respiratory  Result Value Ref Range Status   Specimen Description   Final    BRONCHIAL ALVEOLAR LAVAGE Performed at [facility], [address] [place], [place] [address]    Special Requests   Final    NONE Performed at [facility], [address], [place], [place] [address]    Gram Stain NO WBC SEEN NO ORGANISMS SEEN   Final   Culture   Final    CULTURE  REINCUBATED FOR BETTER GROWTH Performed at [facility], [place] [address], [place], [place] [address]    Report Status PENDING  Incomplete  Culture, blood (Routine X 2) w Reflex to ID Panel     Status: None (Preliminary result)   Collection Time: 06/29/20 10:09 AM   Specimen: BLOOD  Result Value Ref Range Status   Specimen Description BLOOD PIC LINE  Final   Special Requests   Final    Blood Culture adequate volume BOTTLES DRAWN AEROBIC AND ANAEROBIC   Culture   Final    NO GROWTH < 24 HOURS Performed at [facility], [address] [place], [place] [address]    Report Status PENDING  Incomplete  Culture, blood (Routine X 2) w Reflex to ID Panel     Status: None (Preliminary result)   Collection Time: 06/29/20 10:11 AM   Specimen: BLOOD LEFT FOREARM  Result Value Ref Range Status   Specimen Description BLOOD LEFT FOREARM  Final   Special Requests   Final    Blood Culture adequate volume BOTTLES DRAWN AEROBIC AND ANAEROBIC   Culture   Final    NO GROWTH < 24 HOURS Performed at [facility], [address], [place], [place] [address]    Report Status PENDING  Incomplete     Radiology Studies: DG Chest Port 1 View  Result Date: 06/29/2020 CLINICAL DATA:  Acute respiratory failure, ventilatory support EXAM: PORTABLE CHEST 1 VIEW COMPARISON:  None. FINDINGS: Endotracheal tube appears to be approximately 3 cm above the carina. Right PICC line tip proximal right atrium level. NG tube enters the stomach with the tip not visualized below the hemidiaphragms. Stable cardiomegaly  with slight improvement in diffuse airspace disease versus edema. Right pleural effusion remains. No pneumothorax. Previous median sternotomy noted. Vascular stent graft noted. Degenerative changes of the spine. IMPRESSION: Stable support apparatus Cardiomegaly with improving airspace process versus edema. Similar right pleural effusion. Electronically Signed   By: Jerilynn Mages.  Shick M.D.   On: 06/29/2020 08:07     Scheduled Meds: . chlorhexidine gluconate (MEDLINE KIT)  15 mL Mouth Rinse BID  . Chlorhexidine Gluconate Cloth  6 each Topical Daily  . docusate  100 mg Per Tube BID  . enoxaparin (LOVENOX) injection  40 mg Subcutaneous Q24H  . free water  100 mL Per Tube Q4H  . insulin aspart  0-20 Units Subcutaneous TID WC  . insulin aspart  0-5 Units Subcutaneous QHS  . insulin glargine  10 Units Subcutaneous Daily  . levothyroxine  50 mcg Per Tube Daily  . mouth rinse  15 mL Mouth Rinse 10 times per day  . pantoprazole (PROTONIX) IV  40 mg Intravenous Q12H  . polyethylene glycol  17 g Per Tube Daily  . sodium chloride flush  10-40 mL Intracatheter Q12H   Continuous Infusions: . sodium chloride Stopped (06/23/20 1400)  . ceFEPime (MAXIPIME) IV Stopped (06/30/20 0853)  . feeding supplement (VITAL AF 1.2 CAL) 1,000 mL (06/28/20 1532)  . norepinephrine (LEVOPHED) Adult infusion 18 mcg/min (06/30/20 1129)  . vasopressin 0.03 Units/min (06/30/20 1129)    LOS: 9 days   Roxan Hockey, MD How to contact the [place] Attending or Consulting provider [place] or covering provider during after hours [place], for this patient?  1. Check the care team in [facility] and look for a) attending/consulting [facility] provider listed and b) the [facility] team listed 2. Log into [email or web address] and use [place] universal password to access. If you do not have the password, please contact the hospital operator. 3. Locate the [facility] provider you are looking for under [facility] and page to a number that you can be directly reached. 4. If you still have difficulty reaching the provider, please page the [facility] (Director on Call) for the Hospitalists listed on amion for assistance.  06/30/2020, 12:55 PM

## 2020-06-30 NOTE — Progress Notes (Signed)
Pt placed on cooling blanket per MD order

## 2020-06-30 NOTE — Progress Notes (Signed)
Palliative:  HPI:60 y.o.malewith past medical history of repaired tetralogy of Fallot, diastolic heart failure, persistent atrial fibrillation on apixaban, hypertension, chronic respiratory failure on 2L, paralyzed right diaphragm, diabetes, GI bleed (admission at [facility] 3/24-3/27 for GIB)admitted on 4/4/2022with decline with cough, weakness since discharge from [facility] and admitted with sepsis pneumonia requiring mechanical ventilation.  I met today at [facility] bedside with brothers, Sonia Side and Juanda Crumble. We had discussion regarding Jay Skinner's decline and worsening infection. We discussed fever, decreased mental status, need for vaspressors, and leukocytosis. Unfortunately he seems to be getting worse instead of better. I explained that I feel that although the doctors are helping Trayven in every way they know how, I worry that he may not be able to survive this illness. They both know and understand. Family have a strong faith and appear to have peace if Jsaon's time comes and God decides to take him. Although they are still hoping that he can pull through they do know that the odds of him improving seem to be getting worse instead of better. I encouraged them to continue trying to prepare themselves and the rest of the family for poor outcome. They are very appreciative of the care and attention that Reyden has been receiving here. They are both very clear that Chipper would not be happy with a quality of life where he could not ultimately return home and play his harmonica. Family also understand the temporary nature of ETT and that when we get closer to time of extubation we will know our expectations if this is to focus on comfort and allowing peaceful death or with hopes that we can continue to support him towards a path of improvement.   All questions/concerns addressed. Emotional support provided.   Exam: Ill-appearing. He does nod head yes to question but inconsistent. Unable to move extremities.  Generalized edema. Lower extremities cold to touch. Cooling blanket for fevers.   Plan: - Continue intubation and aggressive care. Family hoping for the best but preparing for the worst. They are realistic.   [place], NP Palliative Medicine Team Pager [phone] (Please see [email or web address] for schedule) Team Phone [phone]    Greater than 50%  of this time was spent counseling and coordinating care related to the above assessment and plan

## 2020-07-01 ENCOUNTER — Inpatient Hospital Stay (HOSPITAL_COMMUNITY): Payer: Medicare Other

## 2020-07-01 DIAGNOSIS — J9602 Acute respiratory failure with hypercapnia: Secondary | ICD-10-CM | POA: Diagnosis not present

## 2020-07-01 DIAGNOSIS — N179 Acute kidney failure, unspecified: Secondary | ICD-10-CM | POA: Diagnosis not present

## 2020-07-01 DIAGNOSIS — J9601 Acute respiratory failure with hypoxia: Secondary | ICD-10-CM | POA: Diagnosis not present

## 2020-07-01 LAB — BASIC METABOLIC PANEL
Anion gap: 12 (ref 5–15)
BUN: 112 mg/dL — ABNORMAL HIGH (ref 6–20)
CO2: 29 mmol/L (ref 22–32)
Calcium: 7.4 mg/dL — ABNORMAL LOW (ref 8.9–10.3)
Chloride: 102 mmol/L (ref 98–111)
Creatinine, Ser: 2.03 mg/dL — ABNORMAL HIGH (ref 0.61–1.24)
GFR, Estimated: 37 mL/min — ABNORMAL LOW (ref >60)
Glucose, Bld: 276 mg/dL — ABNORMAL HIGH (ref 70–99)
Potassium: 4.3 mmol/L (ref 3.5–5.1)
Sodium: 143 mmol/L (ref 135–145)

## 2020-07-01 LAB — PHOSPHORUS: Phosphorus: 2.9 mg/dL (ref 2.5–4.6)

## 2020-07-01 LAB — CBC
HCT: 35.7 % — ABNORMAL LOW (ref 39.0–52.0)
Hemoglobin: 11.3 g/dL — ABNORMAL LOW (ref 13.0–17.0)
MCH: 33.5 pg (ref 26.0–34.0)
MCHC: 31.7 g/dL (ref 30.0–36.0)
MCV: 105.9 fL — ABNORMAL HIGH (ref 80.0–100.0)
Platelets: 130 10*3/uL — ABNORMAL LOW (ref 150–400)
RBC: 3.37 MIL/uL — ABNORMAL LOW (ref 4.22–5.81)
RDW: 14.6 % (ref 11.5–15.5)
WBC: 11.5 10*3/uL — ABNORMAL HIGH (ref 4.0–10.5)
nRBC: 0.7 % — ABNORMAL HIGH (ref 0.0–0.2)

## 2020-07-01 LAB — CULTURE, BAL-QUANTITATIVE W GRAM STAIN
Culture: 10000 — AB
Gram Stain: NONE SEEN

## 2020-07-01 LAB — MAGNESIUM: Magnesium: 2.7 mg/dL — ABNORMAL HIGH (ref 1.7–2.4)

## 2020-07-01 LAB — GLUCOSE, CAPILLARY: Glucose-Capillary: 237 mg/dL — ABNORMAL HIGH (ref 70–99)

## 2020-07-01 LAB — PROCALCITONIN: Procalcitonin: 0.65 ng/mL

## 2020-07-01 MED ORDER — MORPHINE 100MG IN NS 100ML (1MG/ML) PREMIX INFUSION
0.5000 mg/h | INTRAVENOUS | Status: DC
  Administered 2020-07-01: 0.5 mg/h via INTRAVENOUS
  Filled 2020-07-01: qty 100

## 2020-07-01 MED ORDER — GLYCOPYRROLATE 0.2 MG/ML IJ SOLN: 0.4000 mg | INTRAMUSCULAR | Status: DC | PRN

## 2020-07-01 MED ORDER — GLYCOPYRROLATE 0.2 MG/ML IJ SOLN
0.2000 mg | INTRAMUSCULAR | Status: DC | PRN
Start: 2020-07-01 — End: 2020-07-01

## 2020-07-01 MED ORDER — GLYCOPYRROLATE 0.2 MG/ML IJ SOLN
0.4000 mg | INTRAMUSCULAR | Status: DC
  Administered 2020-07-01 (×2): 0.4 mg via INTRAVENOUS
  Filled 2020-07-01 (×2): qty 2

## 2020-07-01 MED ORDER — GLYCOPYRROLATE 0.2 MG/ML IJ SOLN: 0.2000 mg | INTRAMUSCULAR | Status: DC | PRN

## 2020-07-01 MED ORDER — POLYVINYL ALCOHOL 1.4 % OP SOLN: 1.0000 [drp] | Freq: Four times a day (QID) | OPHTHALMIC | Status: DC | PRN

## 2020-07-01 MED ORDER — GLYCOPYRROLATE 1 MG PO TABS: 1.0000 mg | ORAL_TABLET | ORAL | Status: DC | PRN

## 2020-07-01 MED ORDER — INSULIN GLARGINE 100 UNIT/ML ~~LOC~~ SOLN
12.0000 [IU] | Freq: Every day | SUBCUTANEOUS | Status: DC
  Filled 2020-07-01 (×2): qty 0.12

## 2020-07-01 MED ORDER — MORPHINE BOLUS VIA INFUSION
2.0000 mg | INTRAVENOUS | Status: DC | PRN
  Administered 2020-07-01 (×3): 2 mg via INTRAVENOUS
  Filled 2020-07-01: qty 2

## 2020-07-01 MED ORDER — LORAZEPAM 2 MG/ML IJ SOLN: 1.0000 mg | INTRAMUSCULAR | Status: DC | PRN

## 2020-07-01 MED ORDER — DIPHENHYDRAMINE HCL 50 MG/ML IJ SOLN: 25.0000 mg | INTRAMUSCULAR | Status: DC | PRN

## 2020-07-02 DIAGNOSIS — Z515 Encounter for palliative care: Secondary | ICD-10-CM

## 2020-07-04 LAB — CULTURE, BLOOD (ROUTINE X 2)
Culture: NO GROWTH
Culture: NO GROWTH
Special Requests: ADEQUATE
Special Requests: ADEQUATE

## 2020-07-18 NOTE — Progress Notes (Signed)
Pt died at 2024 with family at bedside. Caleen Jobs, RN and I pronounced cardiac death. DO notified of pt passing.

## 2020-07-18 NOTE — Progress Notes (Signed)
Nutrition Brief Note  Chart reviewed. Pt now transitioning to comfort care.  No further nutrition interventions planned at this time.  Please re-consult as needed.   Josselin Gaulin W, RD, LDN, CDCES Registered Dietitian II Certified Diabetes Care and Education Specialist Please refer to AMION for RD and/or RD on-call/weekend/after hours pager   

## 2020-07-18 NOTE — Death Summary Note (Signed)
DEATH SUMMARY   Patient Details  Name: Jay Skinner MRN: [id number] DOB: 08/19/1960  Admission/Discharge Information   Admit Date:  07/03/2020  Date of Death: Date of Death: 2020/07/13  Time of Death: Time of Death: 2022/05/11  Length of Stay: May 11, 2022  Referring Physician: Sharilyn Sites, MD   Reason(s) for Hospitalization   admitted with circulatory shock likely sepsis, pneumonia, acute respiratory failure.   Diagnoses  Preliminary cause of death:  Secondary Diagnoses (including complications and co-morbidities):  Principal Problem:   Acute respiratory failure with hypoxia and hypercapnia (HCC) Active Problems:   Palliative care encounter   End of life care   Gastrointestinal hemorrhage associated with gastric ulcer   HTN (hypertension)   Persistent atrial fibrillation (HCC)   Tetralogy of Fallot   Type 2 diabetes mellitus ([place])   Shock circulatory (HCC)   Metabolic alkalosis with respiratory acidosis   Atelectasis, right   Brief Hospital Course (including significant findings, care, treatment, and services provided and events leading to death)  Jay Skinner is a 60 y.o. year old male  -Brief Admission History:  60 y.o.malewith medical history significant forcompletely repairedtetralogy of Fallot,persistent atrial fibrillation,diabetes mellitus, GI bleed, hypertension,chronic respiratory failure on 2 L,paralyzed right diaphragm. History is obtained from chart review as at the time of evaluation patient is intubated.  Pt was brought to the ED via EMS.Patient was recently discharged [facility] after admission 3/24-3/27 for GI bleed. Since discharge to home, patient has steadily declined. Patient's brother found patient sitting in his chair, sleeping and drooling. At baseline he is alert and conversant. Family also reported that patient has had a cough over the past several days.  He was admitted with circulatory shock likely sepsis, pneumonia, acute respiratory  failure.    2020-07-13 Family conference with Pt's brothers x 2, sister x 1 and niece x 1 Palliative care provider Vinie Sill , RN Annitta Jersey , PCCM attending (Dr Kara Mead), And this writer were present --- -Family requesting transition to full comfort care -d/c Iv Levophed, dc Vasopressin -Terminally extubated at 6 with family at bedside Time of Death--2024 PM  Pertinent Labs and Studies  Significant Diagnostic Studies CT Head Wo Contrast  Result Date: 2020/07/03 CLINICAL DATA:  Shortness of breath and weakness. Mental status changes with unknown cause. EXAM: CT HEAD WITHOUT CONTRAST TECHNIQUE: Contiguous axial images were obtained from the base of the skull through the vertex without intravenous contrast. COMPARISON:  None. FINDINGS: Brain: No evidence of acute infarction, hemorrhage, hydrocephalus, extra-axial collection or mass lesion/mass effect. Vascular: No hyperdense vessel or unexpected calcification. Skull: The calvarium appears intact. Sinuses/Orbits: Paranasal sinuses and mastoid air cells are clear. 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 IMPRESSION: No acute intracranial abnormalities. Electronically Signed   By: Lucienne Capers M.D.   On: 07-03-2020 22:33   [place] CHEST (PLEURAL EFFUSION)  Result Date: 06/23/2020 CLINICAL DATA:  Pleural effusion.  Dyspnea. EXAM: CHEST ULTRASOUND COMPARISON:  June 22, 2020. FINDINGS: Sonographic evaluation of the right chest demonstrates small right pleural effusion. No adequate fluid pocket for thoracentesis is noted. IMPRESSION: Small right pleural effusion is noted as described above. Electronically Signed   By: Marijo Conception M.D.   On: 06/23/2020 09:42   DG Chest Port 1 View  Result Date: July 13, 2020 CLINICAL DATA:  Acute respiratory failure, fever EXAM: PORTABLE CHEST 1 VIEW COMPARISON:  06/29/2020 FINDINGS: Endotracheal tube 3.2 cm above the carina, nasogastric tube extending into the upper abdomen, and right upper  extremity  PICC line with its tip within the superior right atrium are unchanged. Pulmonary insufflation is stable. Moderate right pleural effusion is suspected with resultant right basilar compressive atelectasis and mild right-sided volume loss. Stable mild-to-moderate cardiomegaly with changes of tricuspid valve replacement and stenting of the main pulmonary artery. Mild perihilar and left basilar pulmonary infiltrate likely relates to mild perihilar pulmonary edema, likely cardiogenic in nature. IMPRESSION: Stable support lines and tubes. Stable moderate right pleural effusion with compressive right basilar atelectasis. Superimposed mild pulmonary edema, likely cardiogenic in nature. Electronically Signed   By: Fidela Salisbury MD   On: 02-Jul-2020 06:10   DG Chest Port 1 View  Result Date: 06/29/2020 CLINICAL DATA:  Acute respiratory failure, ventilatory support EXAM: PORTABLE CHEST 1 VIEW COMPARISON:  None. FINDINGS: Endotracheal tube appears to be approximately 3 cm above the carina. Right PICC line tip proximal right atrium level. NG tube enters the stomach with the tip not visualized below the hemidiaphragms. Stable cardiomegaly with slight improvement in diffuse airspace disease versus edema. Right pleural effusion remains. No pneumothorax. Previous median sternotomy noted. Vascular stent graft noted. Degenerative changes of the spine. IMPRESSION: Stable support apparatus Cardiomegaly with improving airspace process versus edema. Similar right pleural effusion. Electronically Signed   By: Jerilynn Mages.  Shick M.D.   On: 06/29/2020 08:07   DG CHEST PORT 1 VIEW  Result Date: 06/27/2020 CLINICAL DATA:  Hypoxia EXAM: PORTABLE CHEST 1 VIEW COMPARISON:  June 24, 2020 FINDINGS: Endotracheal tube tip is 4.3 cm above the carina. Nasogastric tube tip and side port are below the diaphragm. Central catheter tip is in the right atrium, stable. No pneumothorax. There is a right pleural effusion with airspace opacity in the  right mid and lower lung regions. There is left base atelectasis. There is cardiomegaly with pulmonary venous hypertension. Prior median sternotomy with apparent cardiac valve replacement. There is aortic atherosclerosis. No bone lesions. IMPRESSION: Tube and catheter positions as described without pneumothorax. Persistent cardiomegaly with pulmonary vascular congestion. Right pleural effusion with airspace opacity in the right mid and lower lung regions, likely due to combination of atelectasis and potential pneumonia. A degree of pulmonary edema in this area is possible as well. Postoperative changes.  Aortic Atherosclerosis (ICD10-I70.0). Electronically Signed   By: Lowella Grip III M.D.   On: 06/27/2020 08:14   DG CHEST PORT 1 VIEW  Result Date: 06/24/2020 CLINICAL DATA:  Shortness of breath. EXAM: PORTABLE CHEST 1 VIEW COMPARISON:  06/23/2020. FINDINGS: Endotracheal tube, NG tube in stable position. Right PICC line in unchanged position with tip over right atrium. Prior CABG and cardiac valve replacement. Stent graft noted. Stable cardiomegaly. Bilateral pulmonary infiltrates/edema again noted without interim change. Persistent right pleural effusion. No pneumothorax. IMPRESSION: 1. Endotracheal tube and NG tube in stable position. Right PICC line in unchanged position with tip over right atrium. 2. Prior CABG and cardiac valve replacement. Stent graft noted. Stable cardiomegaly. 3. Diffuse bilateral pulmonary infiltrates/edema again noted. No interim change. Persistent right pleural effusion. No pneumothorax. Electronically Signed   By: Marcello Moores  Register   On: 06/24/2020 05:36   DG CHEST PORT 1 VIEW  Result Date: 06/23/2020 CLINICAL DATA:  Central line placement EXAM: PORTABLE CHEST 1 VIEW COMPARISON:  06/22/2020 FINDINGS: Right PICC line is in place with the tip in the right atrium approximately 6 cm deep to the cavoatrial junction. NG tube is in the stomach. Endotracheal tube is in the midtrachea,  unchanged. Prior median sternotomy. Cardiomegaly. Diffuse airspace disease, right greater than left. Probable  layering right pleural effusion. No definite effusion on the left. No pneumothorax. IMPRESSION: Right PICC line tip in the right atrium 6 cm deep to the cavoatrial junction. Diffuse bilateral airspace disease, right greater than left could reflect asymmetric edema or infection. Suspect layering right effusion. Electronically Signed   By: Rolm Baptise M.D.   On: 06/23/2020 17:37   Portable Chest xray  Result Date: 06/22/2020 CLINICAL DATA:  60 year old male intubated . GI hemorrhage. EXAM: PORTABLE CHEST 1 VIEW COMPARISON:  Portable chest 06/20/2020 and earlier. FINDINGS: Portable AP semi upright view at 0506 hours. Enteric tube courses to the abdomen with side hole at the level of the gastric body. Endotracheal tube tip in satisfactory position between the clavicles and carina. Stable cardiomegaly and mediastinal contours. Chronic postoperative changes to the mediastinum. Veiling opacity at the right lung base is stable to mildly increased. No pneumothorax. Pulmonary vascularity appears stable without overt edema. Negative left lung base. Paucity of bowel gas in the upper abdomen. No acute osseous abnormality identified. IMPRESSION: 1. Endotracheal and enteric tube in satisfactory position. 2. Right pleural effusion suspected and stable to mildly increased since yesterday. Stable cardiomegaly. Electronically Signed   By: Genevie Ann M.D.   On: 06/22/2020 05:33   DG Chest Portable 1 View  Result Date: 07/03/2020 CLINICAL DATA:  60 year old male status post intubation. EXAM: PORTABLE CHEST 1 VIEW COMPARISON:  Earlier radiograph dated 06/20/2020. FINDINGS: Interval placement of an endotracheal tube. The tip of the tube is suboptimally visualized due to overlying median sternotomy wires and cardiac valve replacement. The tip however appears in the region of the carina. Recommend retraction by approximately 3  cm. Enteric tube extends below the diaphragm with tip beyond the inferior margin of the image. Significant interval decrease in size of right pleural effusion and improvement in aeration of the right lung. No pneumothorax. Stable cardiomegaly. Atherosclerotic calcification of the aorta. Degenerative changes of the spine. No acute osseous pathology. IMPRESSION: 1. Interval placement of an endotracheal tube with tip close to the carina. Recommend retraction by approximately 3 cm. 2. Significant interval decrease in size of the right pleural effusion and improvement in aeration of the right lung. Electronically Signed   By: Anner Crete M.D.   On: 07/05/2020 21:29   DG Chest Port 1 View  Result Date: 06/27/2020 CLINICAL DATA:  Shortness of breath and weakness. Patient is on BiPAP. EXAM: PORTABLE CHEST 1 VIEW COMPARISON:  07/02/2013 FINDINGS: Postoperative changes in the mediastinum. There is complete opacification of the right hemithorax. This could represent either or combination of pleural effusion, consolidation, and/or collapse. Perihilar infiltration on the left lung. Heart size is obscured by the right chest process. IMPRESSION: Complete opacification of the right hemithorax. Perihilar infiltration on the left. Electronically Signed   By: Lucienne Capers M.D.   On: 06/23/2020 20:30   DG ABD ACUTE 2+V W 1V CHEST  Result Date: 06/28/2020 CLINICAL DATA:  Abdominal distension EXAM: DG ABDOMEN ACUTE WITH 1 VIEW CHEST COMPARISON:  None. FINDINGS: Nasogastric tube terminates in the left upper quadrant in the expected site of the stomach. No dilated loops of bowel to indicate ileus or obstruction. No suspicious calcifications of the abdomen or pelvis. Diffuse bilateral airspace opacities. Aortic stent is noted. Endotracheal tube tip located approximately 3 cm of the carina. IMPRESSION: 1. Nonobstructive, nonspecific bowel gas pattern. 2. Diffuse bilateral lung opacities, worse on the right, similar to prior  exam. Electronically Signed   By: Miachel Roux M.D.   On: 06/28/2020  07:52   EEG adult  Result Date: 06/23/2020 Lora Havens, MD     06/23/2020  8:06 AM Patient Name: PLUMER MITTELSTAEDT MRN: [id number] Epilepsy Attending: Lora Havens Referring Physician/Provider: Dr Irwin Brakeman Date: 06/22/2020 Duration: 23.12 mins Patient history: 60yo M with ams. EEG to evaluate for seizure Level of alertness: comatose AEDs during EEG study: Propofol Technical aspects: This EEG study was done with scalp electrodes positioned according to the 10-20 International system of electrode placement. Electrical activity was acquired at a sampling rate of _0  and reviewed with a high frequency filter of _1  and a low frequency filter of _2 . EEG data were recorded continuously and digitally stored. Description: EEG showed continuous generalized polymorphic sharply contoured 3 to 6 Hz theta-delta slowing. Hyperventilation and photic stimulation were not performed.   ABNORMALITY - Continuous slow, generalized IMPRESSION: This study is suggestive of moderate to severe diffuse encephalopathy, nonspecific etiology. No seizures or epileptiform discharges were seen throughout the recording. Lora Havens   ECHOCARDIOGRAM COMPLETE  Result Date: 06/22/2020    ECHOCARDIOGRAM REPORT   Patient Name:   SENA CLOUATRE [facility] Date of Exam: 06/22/2020 Medical Rec #:  [id number]        Height:       64.0 in Accession #:    [id number]       Weight:       158.7 lb Date of Birth:  02-Mar-1961        BSA:          1.773 m Patient Age:    53 years         BP:           115/58 mmHg Patient Gender: M                HR:           83 bpm. Exam Location:  [facility] Procedure: 2D Echo, Cardiac Doppler and Color Doppler Indications:    Congenital Heart Disease Q24.0  History:        Patient has no prior history of Echocardiogram examinations.                 Arrythmias:Atrial Flutter; Risk Factors:Hypertension and                 Diabetes. Persistent atrial  fibrillation, Tetralogy of Fallot,                 History of tricuspid valve annuloplasty, Hx of Pulmonary                 insufficiency and moderate pulmonary stenosis.  Sonographer:    Alvino Chapel RCS Referring Phys: [phone] Leanne Chang [facility]  Sonographer Comments: Patient on mechanical ventilator during echo exam. IMPRESSIONS  1. 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 Left ventricular ejection fraction, by estimation, is 60 to 65%. The left ventricle has normal function. The left ventricle has no regional wall motion abnormalities. There is mild left ventricular hypertrophy. Left ventricular diastolic parameters are indeterminate.  2. Right ventricular systolic function is low normal. The right ventricular size is not well visualized.  3. The mitral valve is normal in structure. No evidence of mitral valve regurgitation. No evidence of mitral stenosis.  4. The aortic valve was not well visualized. Aortic valve regurgitation is not visualized. No aortic stenosis is present.  5. Prosthetic pulmonary  valve is poorly visualized. No significant regurgitation or stenosis. Peak gradient 2.4 m/s. FINDINGS  Left Ventricle: From cardiology notes history of Tetralogy of Fallot with pulmonary valve replacement now status post percutaneous pulmonary valve replacement 2021, status post tricuspid valve repair. Left ventricular ejection fraction, by estimation, is 60 to 65%. The left ventricle has normal function. The left ventricle has no regional wall motion abnormalities. The left ventricular internal cavity size was normal in size. There is mild left ventricular hypertrophy. Left ventricular diastolic parameters are indeterminate. Right Ventricle: The right ventricular size is not well visualized. Right vetricular wall thickness was not well visualized. Right ventricular  systolic function is low normal. Left Atrium: Left atrial size was normal in size. Right Atrium: Right atrial size was not well visualized. Pericardium: There is no evidence of pericardial effusion. Mitral Valve: The mitral valve is normal in structure. No evidence of mitral valve regurgitation. No evidence of mitral valve stenosis. Tricuspid Valve: Tricuspid anular is present. The tricuspid valve is not well visualized. Tricuspid valve regurgitation is mild . No evidence of tricuspid stenosis. Aortic Valve: The aortic valve was not well visualized. Aortic valve regurgitation is not visualized. No aortic stenosis is present. Aortic valve mean gradient measures 6.4 mmHg. Aortic valve peak gradient measures 12.8 mmHg. Aortic valve area, by VTI measures 1.75 cm. Pulmonic Valve: The pulmonic valve was not well visualized. Pulmonic valve regurgitation is not visualized. No evidence of pulmonic stenosis. Aorta: The aortic root is normal in size and structure. Pulmonary Artery: Prosthetic pulmonary valve is poorly visualized. No significant regurgitation or stenosis. Peak gradient 2.4 m/s. Venous: Patient on ventilatory, IVC evaluation is not reflective of RA pressures. IAS/Shunts: The interatrial septum was not well visualized.  LEFT VENTRICLE PLAX 2D LVIDd:         4.90 cm LVIDs:         3.20 cm LV PW:         1.20 cm LV IVS:        1.20 cm LVOT diam:     2.00 cm LV SV:         50 LV SV Index:   28 LVOT Area:     3.14 cm  RIGHT VENTRICLE TAPSE (M-mode): 1.5 cm LEFT ATRIUM             Index       RIGHT ATRIUM           Index LA diam:        4.00 cm 2.26 cm/m  RA Area:     22.70 cm LA Vol (A2C):   60.1 ml 33.89 ml/m RA Volume:   76.50 ml  43.14 ml/m LA Vol (A4C):   44.9 ml 25.32 ml/m LA Biplane Vol: 51.7 ml 29.15 ml/m  AORTIC VALVE AV Area (Vmax):    1.77 cm AV Area (Vmean):   1.79 cm AV Area (VTI):     1.75 cm AV Vmax:           179.17 cm/s AV Vmean:          119.229 cm/s AV VTI:            0.285 m AV Peak  Grad:      12.8 mmHg AV Mean Grad:      6.4 mmHg LVOT Vmax:         101.00 cm/s LVOT Vmean:        67.800 cm/s LVOT VTI:          0.159  m LVOT/AV VTI ratio: 0.56  AORTA Ao Root diam: 2.80 cm MITRAL VALVE               TRICUSPID VALVE MV Area (PHT): 3.82 cm    TV Mean grad:   1.8 mmHg MV Decel Time: 199 msec    TR Peak grad:   40.4 mmHg MV E velocity: 99.80 cm/s  TR Vmax:        318.00 cm/s MV A velocity: 47.30 cm/s MV E/A ratio:  2.11        SHUNTS                            Systemic VTI:  0.16 m                            Systemic Diam: 2.00 cm Carlyle Dolly MD Electronically signed by Carlyle Dolly MD Signature Date/Time: 06/22/2020/4:00:03 PM    Final    [place] EKG SITE RITE  Result Date: 06/23/2020 If Site Rite image not attached, placement could not be confirmed due to current cardiac rhythm.  Microbiology Recent Results (from the past 240 hour(s))  Culture, BAL-quantitative w Gram Stain     Status: Abnormal   Collection Time: 06/28/20  1:16 PM   Specimen: Bronchoalveolar Lavage; Respiratory  Result Value Ref Range Status   Specimen Description   Final    BRONCHIAL ALVEOLAR LAVAGE Performed at [facility], [address], [place], [place] [address]    Special Requests   Final    NONE Performed at [facility], [address] [place], [place] [address]    Gram Stain NO WBC SEEN NO ORGANISMS SEEN   Final   Culture (A)  Final    10,000 COLONIES/mL Consistent with normal respiratory flora. NO STAPHYLOCOCCUS AUREUS ISOLATED Performed at [facility], [place] [address] [place], [place] [address]    Report Status 07-31-20 FINAL  Final  Culture, blood (Routine X 2) w Reflex to ID Panel     Status: None (Preliminary result)   Collection Time: 06/29/20 10:09 AM   Specimen: BLOOD  Result Value Ref Range Status   Specimen Description BLOOD PIC LINE  Final   Special Requests   Final    Blood Culture adequate volume BOTTLES DRAWN AEROBIC AND ANAEROBIC   Culture   Final    NO GROWTH 3  DAYS Performed at [facility], [address] [place], [place] [address]    Report Status PENDING  Incomplete  Culture, blood (Routine X 2) w Reflex to ID Panel     Status: None (Preliminary result)   Collection Time: 06/29/20 10:11 AM   Specimen: BLOOD LEFT FOREARM  Result Value Ref Range Status   Specimen Description BLOOD LEFT FOREARM  Final   Special Requests   Final    Blood Culture adequate volume BOTTLES DRAWN AEROBIC AND ANAEROBIC   Culture   Final    NO GROWTH 3 DAYS Performed at [facility], [address], [place],  [address]    Report Status PENDING  Incomplete    Lab Basic Metabolic Panel: Recent Labs  Lab 06/26/20 0455 06/27/20 0608 06/28/20 0437 06/29/20 0425 06/30/20 0609 Jul 31, 2020 0350  NA 138 139 137 139 139 143  K 5.1 5.4* 4.8 4.1 4.3 4.3  CL 98 98 99 100 99 102  CO2 _0 GLUCOSE 298* 307*  324* 249* 263* 276*  BUN 55* 78* 95* 101* 110* 112*  CREATININE 1.56* 1.81* 1.75* 1.82* 2.17* 2.03*  CALCIUM 6.9* 7.2* 7.1* 7.2* 7.2* 7.4*  MG 2.4 2.7*  --  2.9*  --  2.7*  PHOS  --   --  3.3 2.7  --  2.9   Liver Function Tests: Recent Labs  Lab 06/26/20 0455 06/27/20 0608 06/28/20 0437  AST 48* 41  --   ALT 35 44  --   ALKPHOS 74 70  --   BILITOT 0.7 0.7  --   PROT 6.3* 6.1*  --   ALBUMIN 2.5* 2.4* 2.5*   No results for input(s): LIPASE, AMYLASE in the last 168 hours. No results for input(s): AMMONIA in the last 168 hours. CBC: Recent Labs  Lab 06/26/20 0455 06/27/20 0608 06/28/20 0436 06/30/20 0609 07/03/2020 0350  WBC 7.9 7.9 7.5 10.8* 11.5*  NEUTROABS 6.4 6.6  --   --   --   HGB 10.1* 10.3* 10.4* 11.0* 11.3*  HCT 32.9* 34.0* 33.5* 35.5* 35.7*  MCV 111.5* 110.0* 107.4* 105.3* 105.9*  PLT 121* 111* 112* 158 130*   Cardiac Enzymes: No results for input(s): CKTOTAL, CKMB, CKMBINDEX, TROPONINI in the last 168 hours. Sepsis Labs: Recent Labs  Lab 06/27/20 0608 06/28/20 0436 06/30/20 0609 07-03-2020 0350  PROCALCITON  --    --  0.73 0.65  WBC 7.9 7.5 10.8* 11.5*    Procedures/Operations  ET tube/Picc Line   Kelvin Sennett 07/02/2020, 6:44 PM

## 2020-07-18 NOTE — Progress Notes (Signed)
NAME:  Jay Skinner, MRN:  [id number], DOB:  December 29, 1960, LOS: 5 ADMISSION DATE:  06/22/2020, CONSULTATION DATE:  4/5 REFERRING MD:  Aline August CHIEF COMPLAINT:  Acute on chronic hypoxemic/hypercarbic Resp failure  Brief patient profile:    History of Present Illness:   60 y.o. male with medical history significant for completely repaired tetralogy of Fallot , persistent atrial fibrillation , diabetes mellitus, GI bleed, hypertension, chronic respiratory failure on 2 L, paralyzed right diaphragm. History is obtained from chart review as at the time of evaluation patient is intubated.  Patient was brought to the ED via EMS.  Patient was recently discharged from [facility] after admission 3/24-3/27 for GI bleed.  Since discharge to home, patient has steadily declined.  Today patient's brother found patient sitting in his chair, sleeping and drooling.  At baseline he is alert and conversant.  Family also reported that patient has had a cough over the past several days.  Hospitalization here at [facility] 3/17 - 3/18-for acute upper GI bleed and acute gastroenteritis.  EGD was deferred for patient to follow-up with his gastroenterologist at [facility].  Hemoglobin remained stable at 11.8 during admission.  He was advised to stop taking his apixaban until he followed up with GI.  ED Course:  Heart rate 67s to 80s.  resp rate 24-32.  Blood pressure systolic 387-564.  O2 sats 91 to 97% on 6 L.  Venous blood gas showed pH of 7.1, VCO2 greater than 120.  BNP 368.  Lactic acid 1.1.  Initial portable chest x-ray showed-complete opacification of the right hemithorax, perihilar infiltration on the left.  Post intubation shunt chest x-ray shows significant improvement in   aeration of right lung.  Patient was initially tried on BiPAP and subsequently intubated. ED provider talked to critical care, okay with admission at [facility].  Pertinent  Medical History  Arthritis Asthma Heart disease sp   completely repaired tetralogy of Fallot History of bleeding ulcers Hypertension Hypothyroidism  Significant Hospital Events: Including procedures, antibiotic start and stop dates in addition to other pertinent events   . Resp viral panel 4/4 > neg covid/ flu  . BC x 1  4/4 >>>ng . ET 4/4 >> . BC x 2 4/5 >>>ng . MRSA PCR 4/5>  Neg . Resp culture/et 4/5 >>>  rare pseudomonaas . Maxepime 4/6 >>> . Vanc IV 4/6  >  4/7  EEG  4/5 >>> moderate to severe diffuse encephalopathy, nonspecific etiology. No seizures or epileptiform discharges were seen throughout the recording. Marland Kitchen [place] R chest 4/6 > not enough effusion to tap  . R PIC 4/6 >>> . ESR  4/7  = 107  > solumedrol 60 q 8 and d/c'd amiodarone . 4/11 bronchoscopy >> thick secretions suctioned out of airways, BAL cx ng . 4/12 Started back on Levophed for soft blood pressure. . 4/13 vasopressin added, on increasing doses of Levophed.   Scheduled Meds: . chlorhexidine gluconate (MEDLINE KIT)  15 mL Mouth Rinse BID  . Chlorhexidine Gluconate Cloth  6 each Topical Daily  . glycopyrrolate  0.4 mg Intravenous Q4H  . mouth rinse  15 mL Mouth Rinse 10 times per day  . sodium chloride flush  10-40 mL Intracatheter Q12H   Continuous Infusions: . sodium chloride Stopped (06/23/20 1400)  . morphine 1 mg/hr (Jul 09, 2020 1127)  . norepinephrine (LEVOPHED) Adult infusion 20 mcg/min (2020/07/09 1305)  . vasopressin 0.03 Units/min (2020/07/09 0823)   PRN Meds:.acetaminophen **OR** acetaminophen, albuterol, diphenhydrAMINE, glycopyrrolate, LORazepam,  morphine, naphazoline-pheniramine, polyvinyl alcohol, sodium chloride flush    Interim History / Subjective:   Remains critically ill. On 24 mics of Levophed and vasopressin Good urine output last 24 hours Febrile 101 at 2 AM  Objective   Blood pressure 100/77, pulse (!) 104, temperature 98.5 F (36.9 C), temperature source Rectal, resp. rate 12, height _0  (1.626 m), weight 87.2 kg, SpO2 93 %.     Vent Mode: CPAP;PSV FiO2 (%):  [40 %] 40 % Set Rate:  [14 bmp] 14 bmp Vt Set:  [470 mL] 470 mL PEEP:  [5 cmH20] 5 cmH20 Pressure Support:  [15 cmH20] 15 cmH20 Plateau Pressure:  [25 cmH20-30 cmH20] 25 cmH20   Intake/Output Summary (Last 24 hours) at 2020-07-17 1354 Last data filed at 07-17-20 0600 Gross per 24 hour  Intake 578.81 ml  Output 2650 ml  Net -2071.19 ml   Filed Weights   06/29/20 0428 06/30/20 0411 07-17-2020 0500  Weight: 84.8 kg 84.7 kg 87.2 kg       Examination:  Chronically ill-appearing, no distress, off sedation No jvd Oropharynx  ET /og Neck supple Lungs decreased breath sounds on right, no accessory muscle use RRR no s3 or or significant  murmur Abd mod distended good bowel sounds, nontender Ext warm 2+bilateral ext  edema   Neuro -appears weak and deconditioned, nods head to name, stick out tongue to command   Chest x-ray 4/14 independently reviewed, ET tube in position, right elevated diaphragm and infiltrate persists  Labs show normal electrolytes, hyperglycemia, slight drop in creatinine, BUN remains high, low procalcitonin, mild leukocytosis, stable anemia and thrombocytopenia  Resolved Hospital Problem list      Assessment & Plan:  1) Acute on chronic hypercabic and hypoxemic Resp failure with post hypercapneic met alkalosis -Limited progress with weaning due to deconditioning, fluid, abdominal distention, right hemidiaphragm paralysis -Improved right lower lobe atelectasis post bronchoscopy  -Unfortunately progress with weaning seems to have stalled due to worsening shock, he tolerates pressure support 10/5 with low tidal volumes     2) Prob HCAP vs asp pna on R in pt with chronically elevated RHD ? Etiology  -Pseudomonas HAP -Continue cefepime for 10 days total -Doubt amiodarone toxicity here, acute illness can cause increased ESR, discontinue steroids   3) Circulatory shock , off Levophed 4/7, restarted 4/12 - echo suggest RV issues  probably related to tetralogy of Fallot/tricuspid repair/pulmonary valve placement 2021 -Pressor requirements increased over the last 48 hours, procalcitonin is low but fevers persistent suggest new sepsis although no source apparent, repeat BAL cultures negative UA does not suggest UTI, no reason to suspect C. difficile or infective endocarditis or fungal sepsis    5) AKI secondary to overdiuresis versus sepsis, Lab Results  Component Value Date   CREATININE 2.03 (H) 07-17-20   CREATININE 2.17 (H) 06/30/2020   CREATININE 1.82 (H) 06/29/2020   -He is auto diuresing although BUN/creatinine remains high  Goals of care discussion 4/14 -detailed discussion with family members including brothers and sister.  Dr. Joesph Fillers and Vinie Sill, NP were also present.  I explained to family that prognosis has worsened over the past 3 days and I am not hopeful that we can liberate Stony from the ventilator.  He does seem to tolerate some pressure support weaning but I am afraid if we extubate him, he will struggle to breathe.  As such we should do it only with the goal of comfort care using morphine on board which can be rapidly titrated should  he struggle to breathe. Family is once again very clear that they would never want tracheostomy or facility care for [facility]  I would be okay with proceeding to comfort care at the time of family's choosing   Best practice (right click and "Reselect all SmartList Selections" daily)  Diet:  Tube Feed  Pain/Anxiety/Delirium protocol (if indicated):   int fent, goal RASS 0 VAP protocol (if indicated): Yes DVT prophylaxis: LMWH and SCD GI prophylaxis: PPI Glucose control:  SSI No Central venous access:  Yes, and it is still needed Arterial line:  N/A Foley:  N/A Mobility:  OOB  PT consulted: N/A Last date of multidisciplinary goals of care discussion 4/11 brother Sonia Side and sister, palliative care RN Elmo Putt present , Caelen plays the harmonica, he would never want  to be in a nursing home with a tracheostomy, no reintubation no tracheostomy Code Status:  DNR Disposition: ICU   Labs   CBC: Recent Labs  Lab 06/25/20 0441 06/26/20 0455 06/27/20 0608 06/28/20 0436 06/30/20 0609 07-20-2020 0350  WBC 6.7 7.9 7.9 7.5 10.8* 11.5*  NEUTROABS 6.2 6.4 6.6  --   --   --   HGB 10.7* 10.1* 10.3* 10.4* 11.0* 11.3*  HCT 35.5* 32.9* 34.0* 33.5* 35.5* 35.7*  MCV 111.3* 111.5* 110.0* 107.4* 105.3* 105.9*  PLT 100* 121* 111* 112* 158 130*    Basic Metabolic Panel: Recent Labs  Lab 06/25/20 0441 06/26/20 0455 06/27/20 0608 06/28/20 0437 06/29/20 0425 06/30/20 0609 07/20/20 0350  NA 139 138 139 137 139 139 143  K 5.3* 5.1 5.4* 4.8 4.1 4.3 4.3  CL 98 98 98 99 100 99 102  CO2 _0 GLUCOSE 237* 298* 307* 324* 249* 263* 276*  BUN 30* 55* 78* 95* 101* 110* 112*  CREATININE 1.06 1.56* 1.81* 1.75* 1.82* 2.17* 2.03*  CALCIUM 7.1* 6.9* 7.2* 7.1* 7.2* 7.2* 7.4*  MG 2.3 2.4 2.7*  --  2.9*  --  2.7*  PHOS  --   --   --  3.3 2.7  --  2.9   GFR: Estimated Creatinine Clearance: 39 mL/min (A) (by C-G formula based on SCr of 2.03 mg/dL (H)). Recent Labs  Lab 06/27/20 0608 06/28/20 0436 06/30/20 0609 Jul 20, 2020 0350  PROCALCITON  --   --  0.73 0.65  WBC 7.9 7.5 10.8* 11.5*    Liver Function Tests: Recent Labs  Lab 06/25/20 0441 06/26/20 0455 06/27/20 0608 06/28/20 0437  AST 16 48* 41  --   ALT 11 35 44  --   ALKPHOS 83 74 70  --   BILITOT 0.9 0.7 0.7  --   PROT 6.7 6.3* 6.1*  --   ALBUMIN 2.6* 2.5* 2.4* 2.5*   No results for input(s): LIPASE, AMYLASE in the last 168 hours. No results for input(s): AMMONIA in the last 168 hours.  ABG    Component Value Date/Time   PHART 7.520 (H) 06/22/2020 1337   PCO2ART 57.3 (H) 06/22/2020 1337   PO2ART 169 (H) 06/22/2020 1337   HCO3 44.6 (H) 06/22/2020 1337   ACIDBASEDEF NOT CALCULATED 06/25/2020 2030   O2SAT 99.1 06/22/2020 1337     Coagulation Profile: No results for input(s): INR,  PROTIME in the last 168 hours.  Cardiac Enzymes: No results for input(s): CKTOTAL, CKMB, CKMBINDEX, TROPONINI in the last 168 hours.  HbA1C: No results found for: HGBA1C  CBG: Recent Labs  Lab 06/30/20 0752 06/30/20 1108 06/30/20 1621 06/30/20 2125 07-20-2020 0726  GLUCAP 256*  311* 280* 274* 237*    The patient is critically ill with multiple organ systems failure and requires high complexity decision making for assessment and support, frequent evaluation and titration of therapies, application of advanced monitoring technologies and extensive interpretation of multiple databases. Critical Care Time devoted to patient care services described in this note independent of APP/resident  time is 66mnutes.      RKara MeadMD. FShade Flood [place] [facility] Pager : [phone]  If no response to pager , please call [phone] until 7 pm After 7:00 pm call Elink  [phone]  42022/04/20

## 2020-07-18 NOTE — Progress Notes (Signed)
Patient terminally extubated per MD with RN, family and palliative at beside at 7. Patient placed on 2L nasal cannula. Tolerated well. RT will continue to monitor.

## 2020-07-18 NOTE — Progress Notes (Signed)
  Family conference with Pt's brothers x 2, sister x 1 and niece x 1 Palliative care provider Vinie Sill , RN Annitta Jersey , PCCM attending (Dr Kara Mead), And this writer were present --- -Family requesting transition to full comfort care -d/c Iv Levophed, dc Vasopressin -Terminally extubated at 42 with family at bedside -Continue full comfort measures -Anticipate in-hospital death Overall prognosis is grave   Total care time 54 minutes  Roxan Hockey, MD

## 2020-07-18 NOTE — Progress Notes (Signed)
Palliative:  HPI:60 y.o.malewith past medical history of repaired tetralogy of Fallot, diastolic heart failure, persistent atrial fibrillation on apixaban, hypertension, chronic respiratory failure on 2L, paralyzed right diaphragm, diabetes, GI bleed (admission at [facility] 3/24-3/27 for GIB)admitted on 04/28/2022with decline with cough, weakness since discharge from [facility] and admitted with sepsis pneumonia requiring mechanical ventilation.  I met this morning with multiple family members along with Dr. Alva and Dr. Emokpae. Family have decided to proceed with one way extubation to comfort focused care and allowing Romulus natural and peaceful death if this is what God has planned for him. Discussed plan for extubation today after all family/friends able to be present and visit with Jenaro. Discussed process of extubation and providing medication to ensure comfort to Tavaughn throughout this process.   I returned to bedside and present during extubation. He initially seemed somewhat uncomfortable mainly from cough but this subsided after a additional morphine provided and he remained very peacefully resting with family at bedside. Discussed with them signs of discomfort and expectations. Discussed that prognosis could be hours or days. Encouraged self care.   All questions/concerns addressed. Therapeutic listening provided. Emotional support provided.   Exam: Extubated to nasal cannula. Initially with dry cough but now resting comfortably on morphine infusion. No distress. Breathing regular, unlabored, shallow. Hypotensive. Generalized edema. BLE cool to touch.   Plan: - Full comfort care after one way extubation.  - Morphine infusion and as needed medications in place to ensure comfort. Medications may be titrated and adjusted as needed to achieve comfort.  - Anticipate hospital death.   100 min  Alicia Parker, NP Palliative Medicine Team Pager [phone] (Please see [email or web address] for schedule) Team  Phone [phone]    Greater than 50%  of this time was spent counseling and coordinating care related to the above assessment and plan  

## 2020-07-18 NOTE — Progress Notes (Signed)
Received referral from [facility] NP for palliative that patient was to be extubated and family would like support and prayer bedside. PC arrived to find multiple family members present bedside today. They were tearful but appropriate and welcomed prayer. Patient was extubated and seemed very comfortable at EOL. Chaplain will continue to remain present and available in order to provide spiritual support and to assess for spiritual need.

## 2020-07-18 DEATH — deceased

## 2022-03-14 IMAGING — DX DG CHEST 1V PORT
1 series · 1 of 1 positions shown · non-contrast
Comparison: June 24, 2020

CLINICAL DATA: Hypoxia

EXAM:
PORTABLE CHEST 1 VIEW

[chest ap]
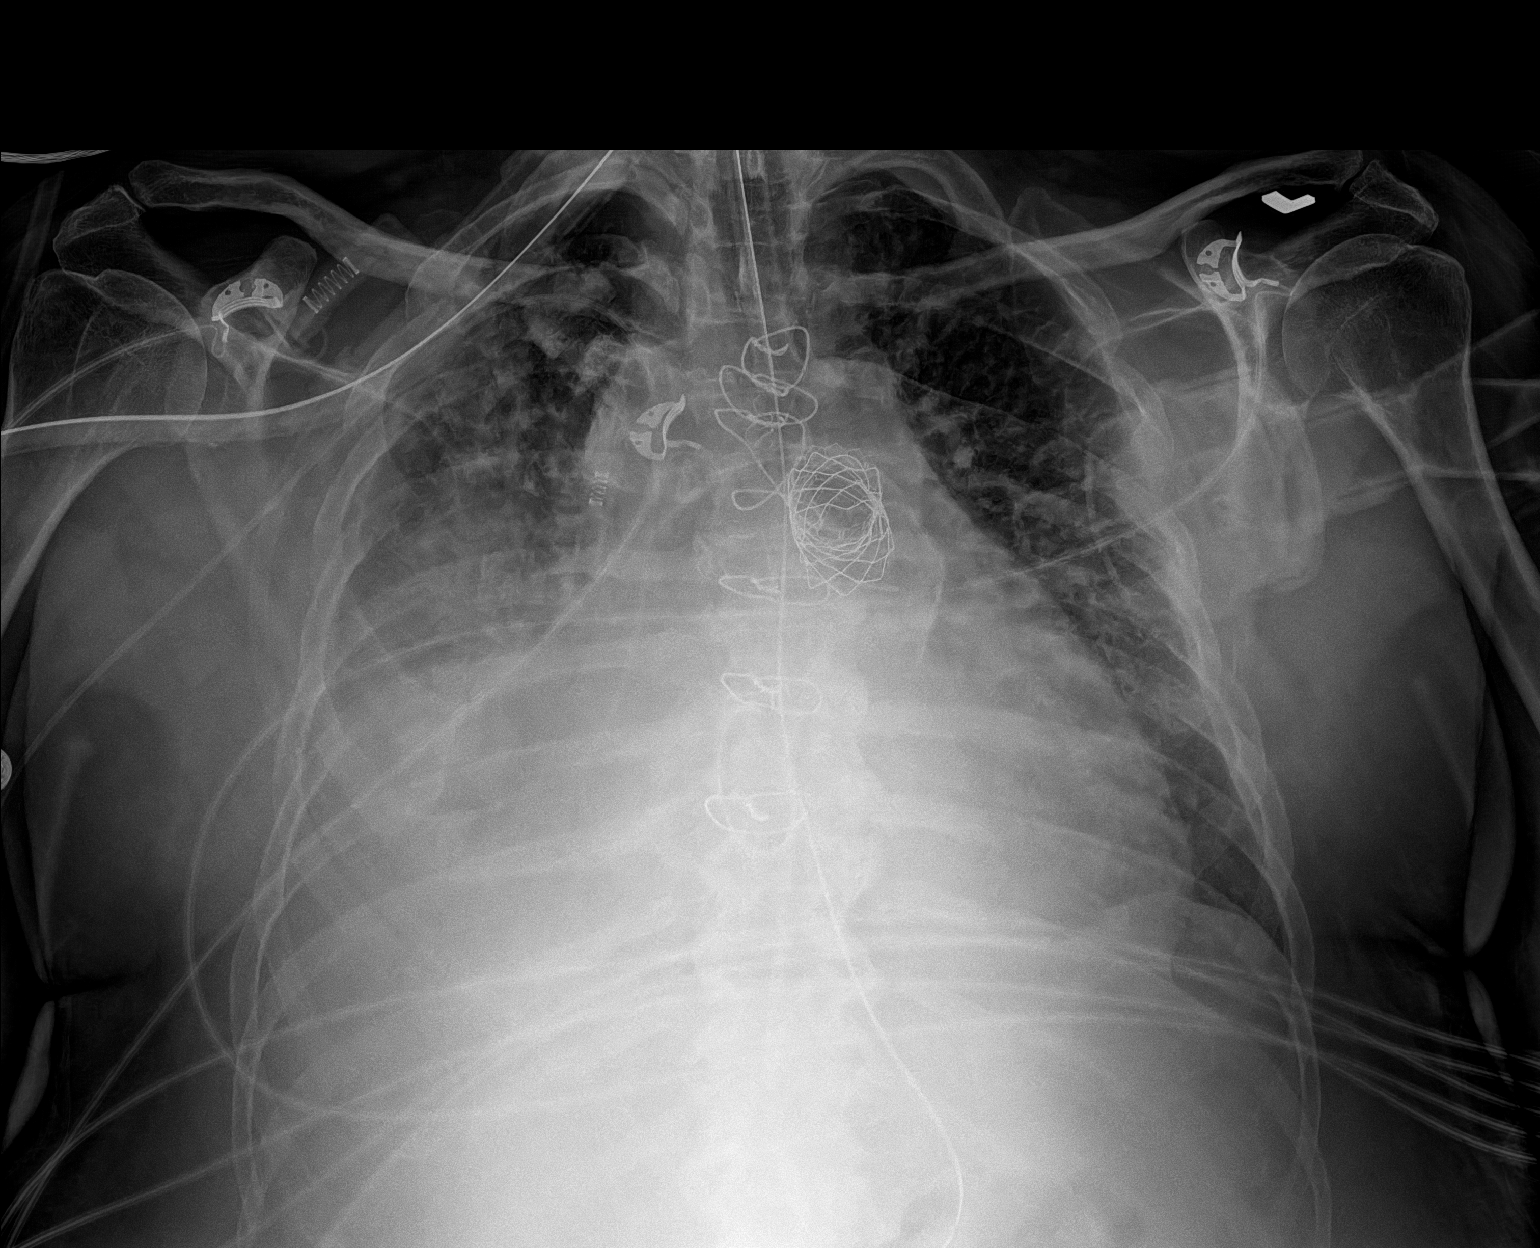

[1 of 1 positions shown; findings below may reference images not displayed]

FINDINGS: Endotracheal tube tip is 4.3 cm above the carina. Nasogastric tube
tip and side port are below the diaphragm. Central catheter tip is
in the right atrium, stable. No pneumothorax. There is a right
pleural effusion with airspace opacity in the right mid and lower
lung regions. There is left base atelectasis. There is cardiomegaly
with pulmonary venous hypertension. Prior median sternotomy with
apparent cardiac valve replacement. There is aortic atherosclerosis.
No bone lesions.
IMPRESSION: Tube and catheter positions as described without pneumothorax.
Persistent cardiomegaly with pulmonary vascular congestion. Right
pleural effusion with airspace opacity in the right mid and lower
lung regions, likely due to combination of atelectasis and potential
pneumonia. A degree of pulmonary edema in this area is possible as
well.

Postoperative changes.  Aortic Atherosclerosis ([id number]).

## 2022-03-15 IMAGING — DX DG ABDOMEN ACUTE W/ 1V CHEST
3 series · 3 of 3 positions shown · non-contrast
Comparison: None.

CLINICAL DATA: Abdominal distension

EXAM:
DG ABDOMEN ACUTE WITH 1 VIEW CHEST

[abdomen erect ap]
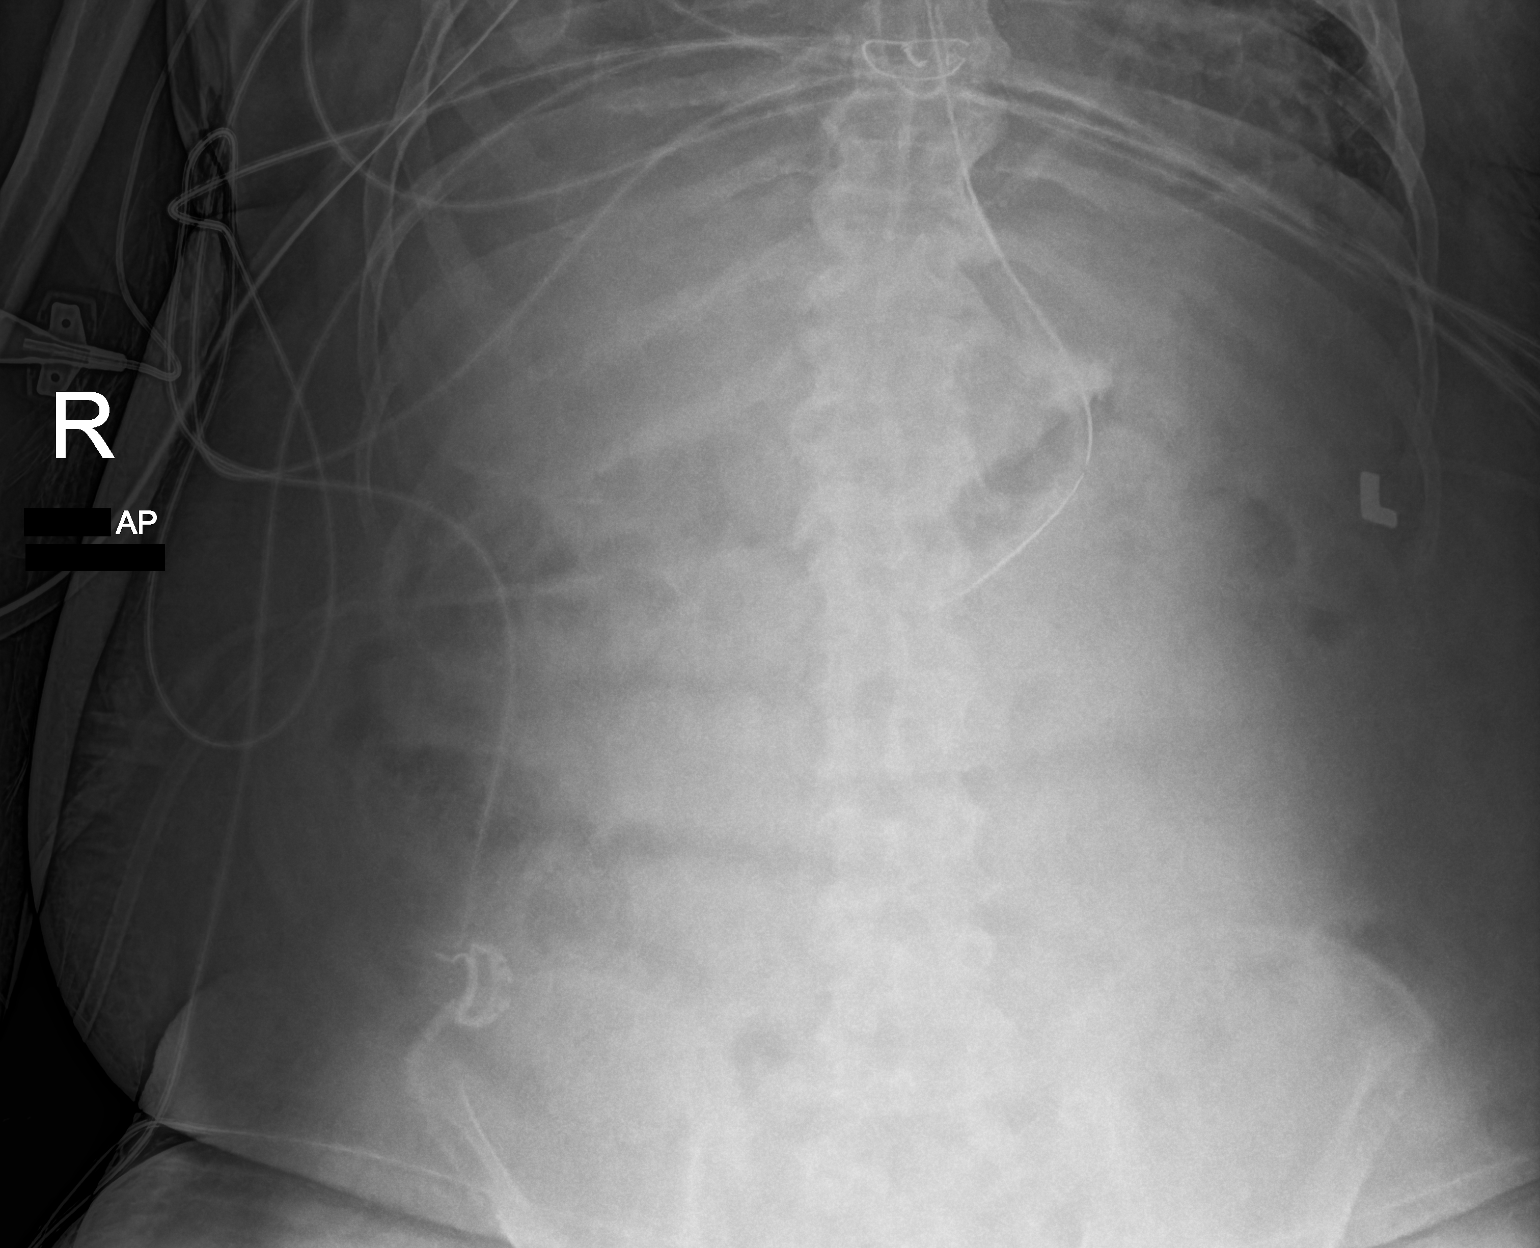

[abdomen supine]
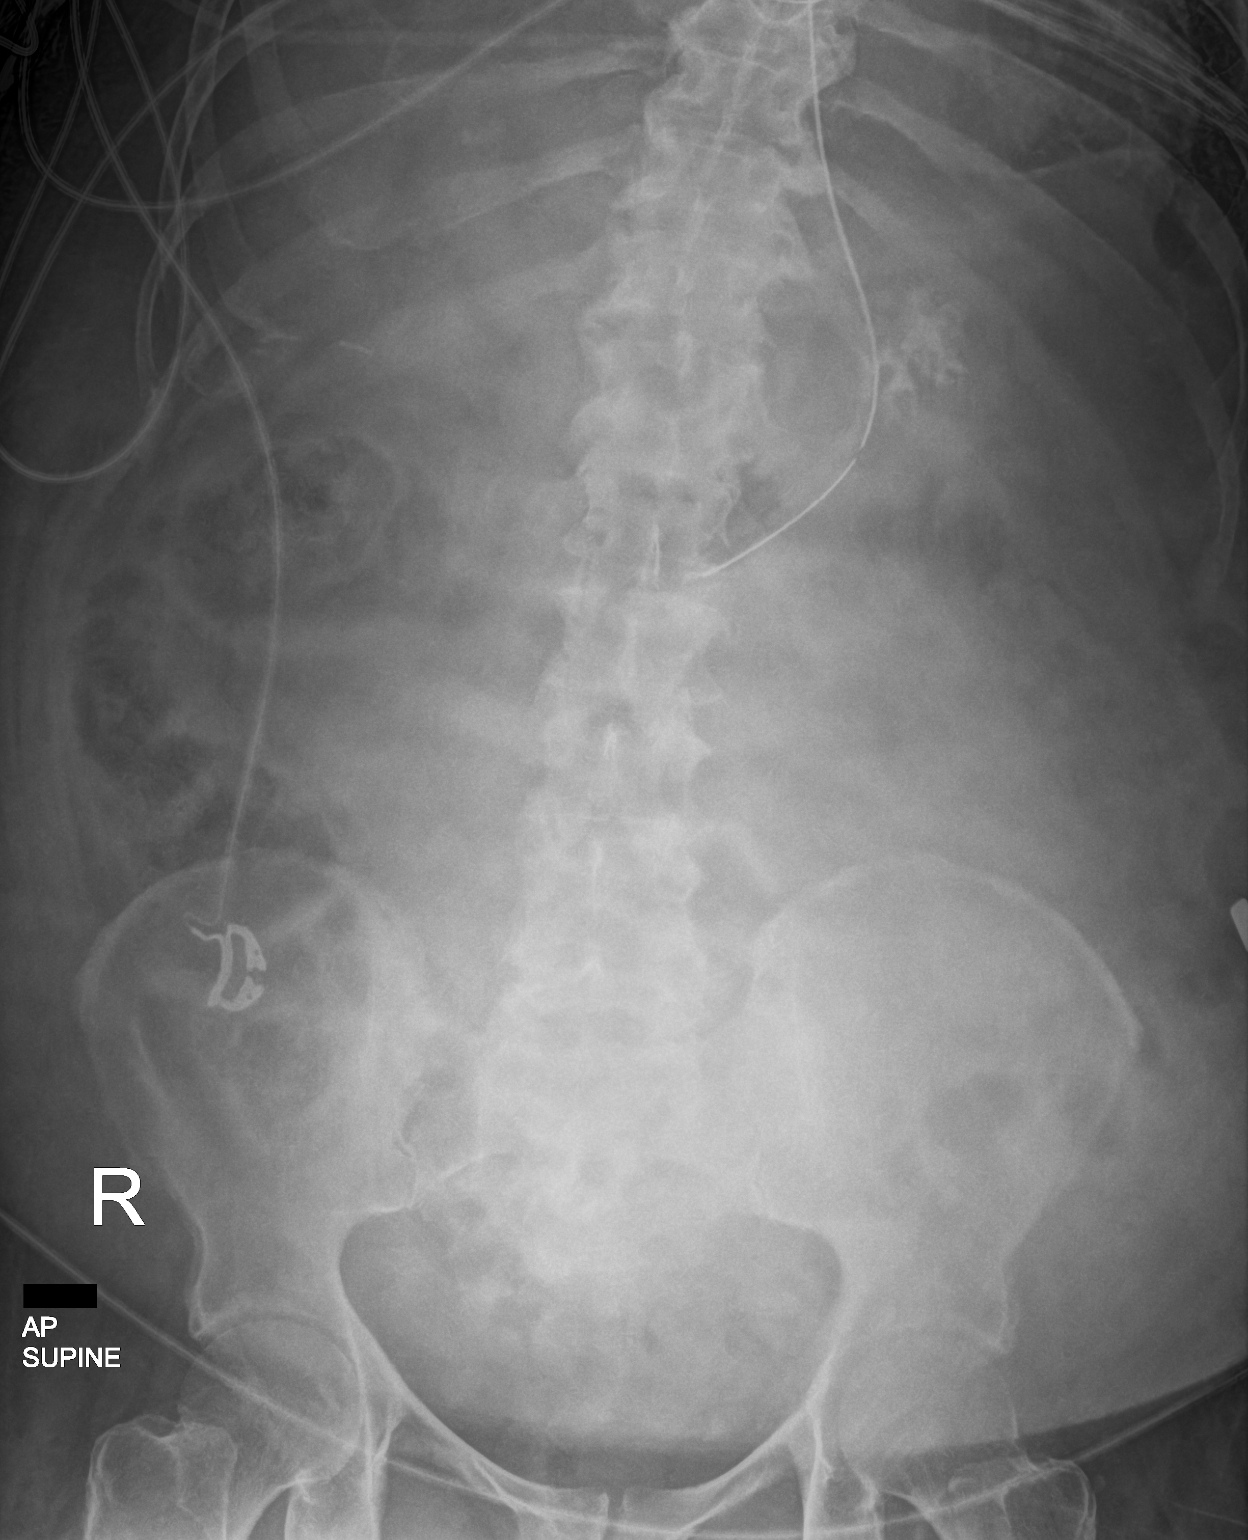

[chest ap]
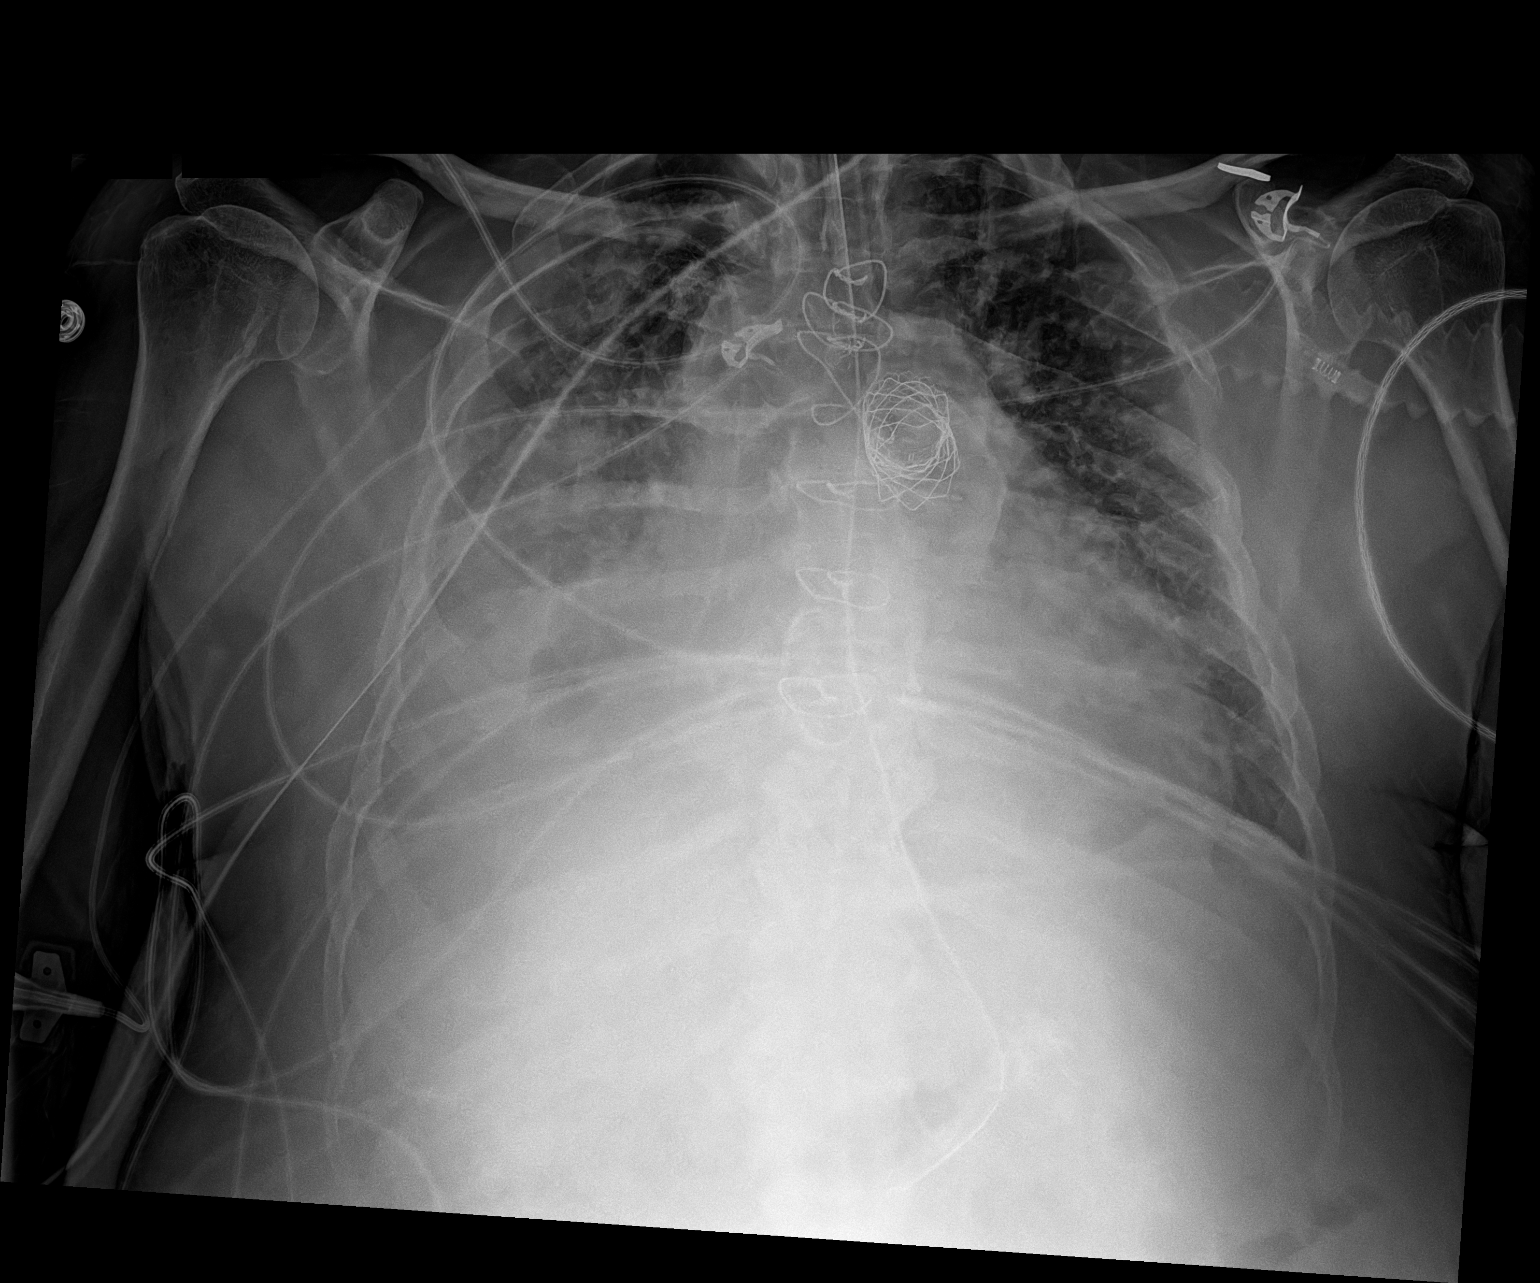

[3 of 3 positions shown; findings below may reference images not displayed]

FINDINGS: Nasogastric tube terminates in the left upper quadrant in the
expected site of the stomach.

No dilated loops of bowel to indicate ileus or obstruction. No
suspicious calcifications of the abdomen or pelvis.

Diffuse bilateral airspace opacities. Aortic stent is noted.
Endotracheal tube tip located approximately 3 cm of the carina.
IMPRESSION: 1. Nonobstructive, nonspecific bowel gas pattern.
2. Diffuse bilateral lung opacities, worse on the right, similar to
prior exam.

## 2022-03-16 IMAGING — DX DG CHEST 1V PORT
1 series · 1 of 1 positions shown · non-contrast
Comparison: None.

CLINICAL DATA: Acute respiratory failure, ventilatory support

EXAM:
PORTABLE CHEST 1 VIEW

[chest ap]
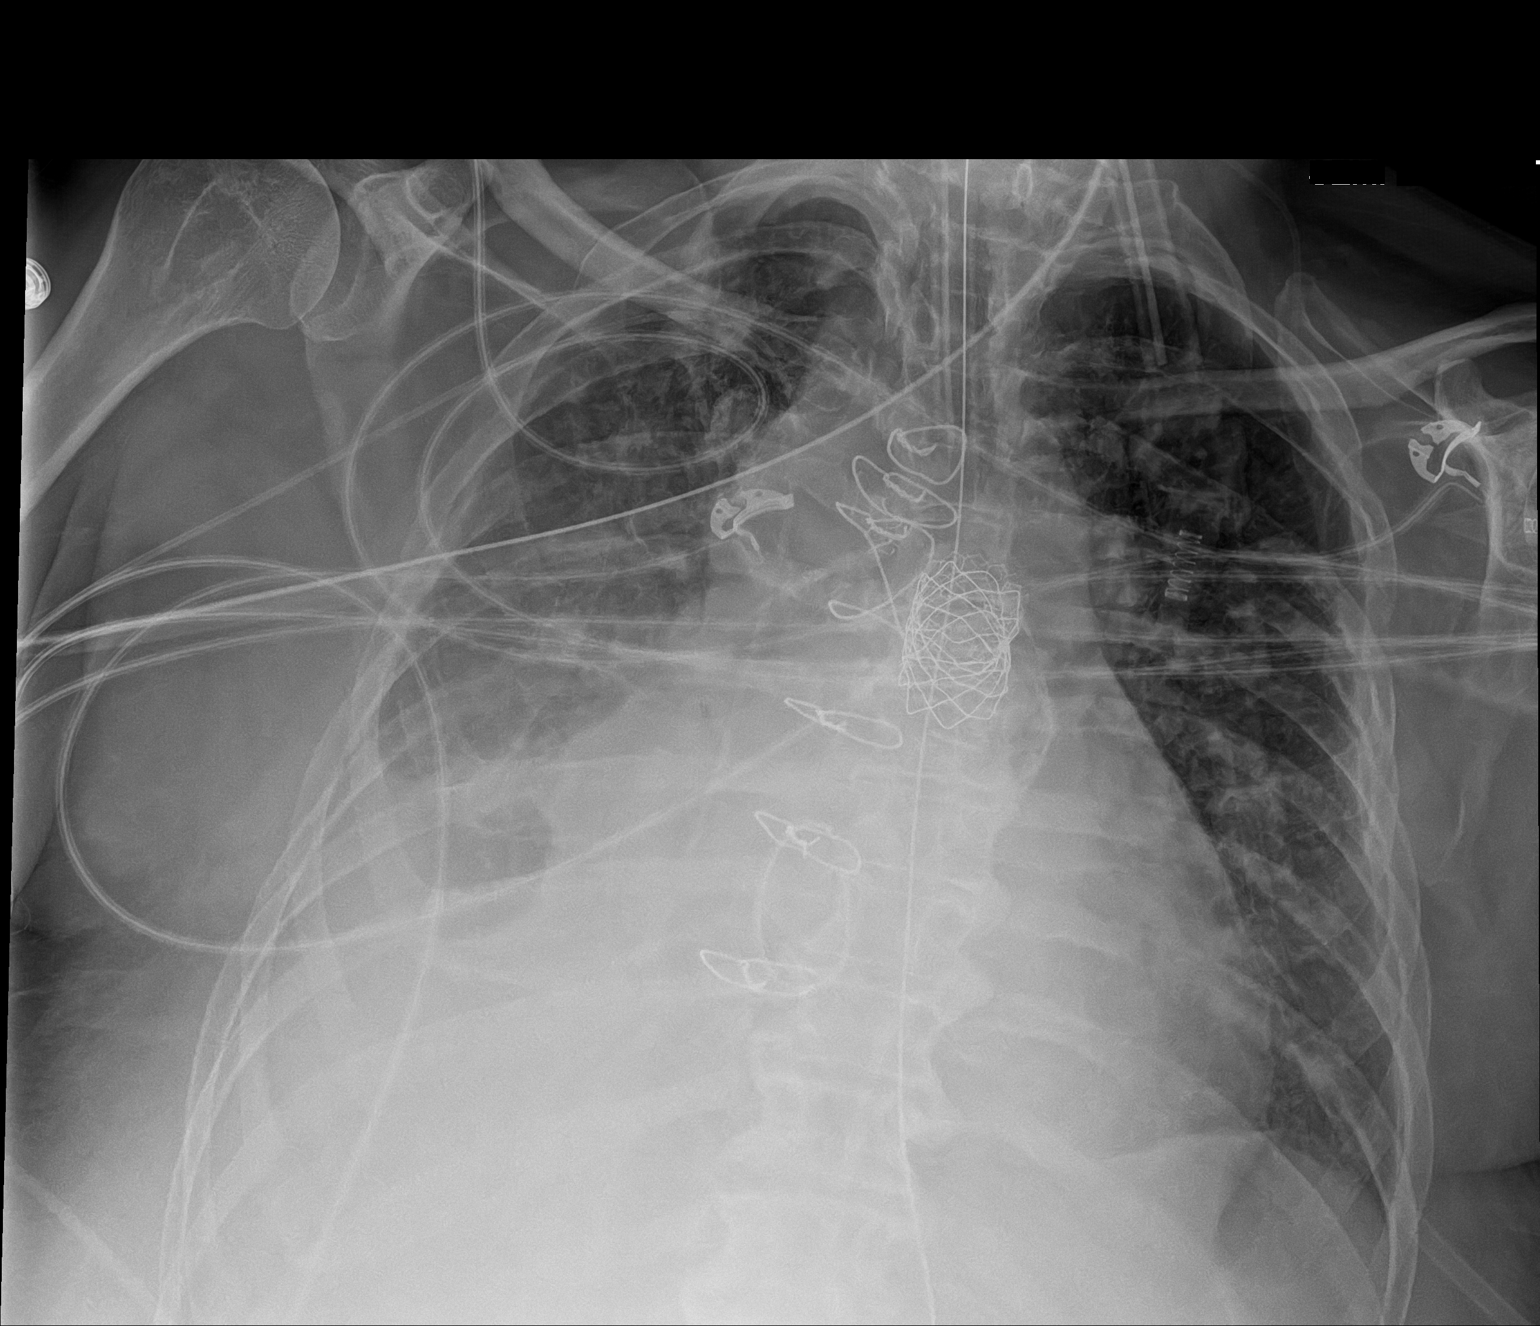

[1 of 1 positions shown; findings below may reference images not displayed]

FINDINGS: Endotracheal tube appears to be approximately 3 cm above the carina.
Right PICC line tip proximal right atrium level. NG tube enters the
stomach with the tip not visualized below the hemidiaphragms.

Stable cardiomegaly with slight improvement in diffuse airspace
disease versus edema. Right pleural effusion remains. No
pneumothorax. Previous median sternotomy noted. Vascular stent graft
noted.

Degenerative changes of the spine.
IMPRESSION: Stable support apparatus

Cardiomegaly with improving airspace process versus edema.

Similar right pleural effusion.

## 2022-03-18 IMAGING — DX DG CHEST 1V PORT
1 series · 1 of 1 positions shown · non-contrast
Comparison: 06/29/2020

CLINICAL DATA: Acute respiratory failure, fever

EXAM:
PORTABLE CHEST 1 VIEW

[chest ap]
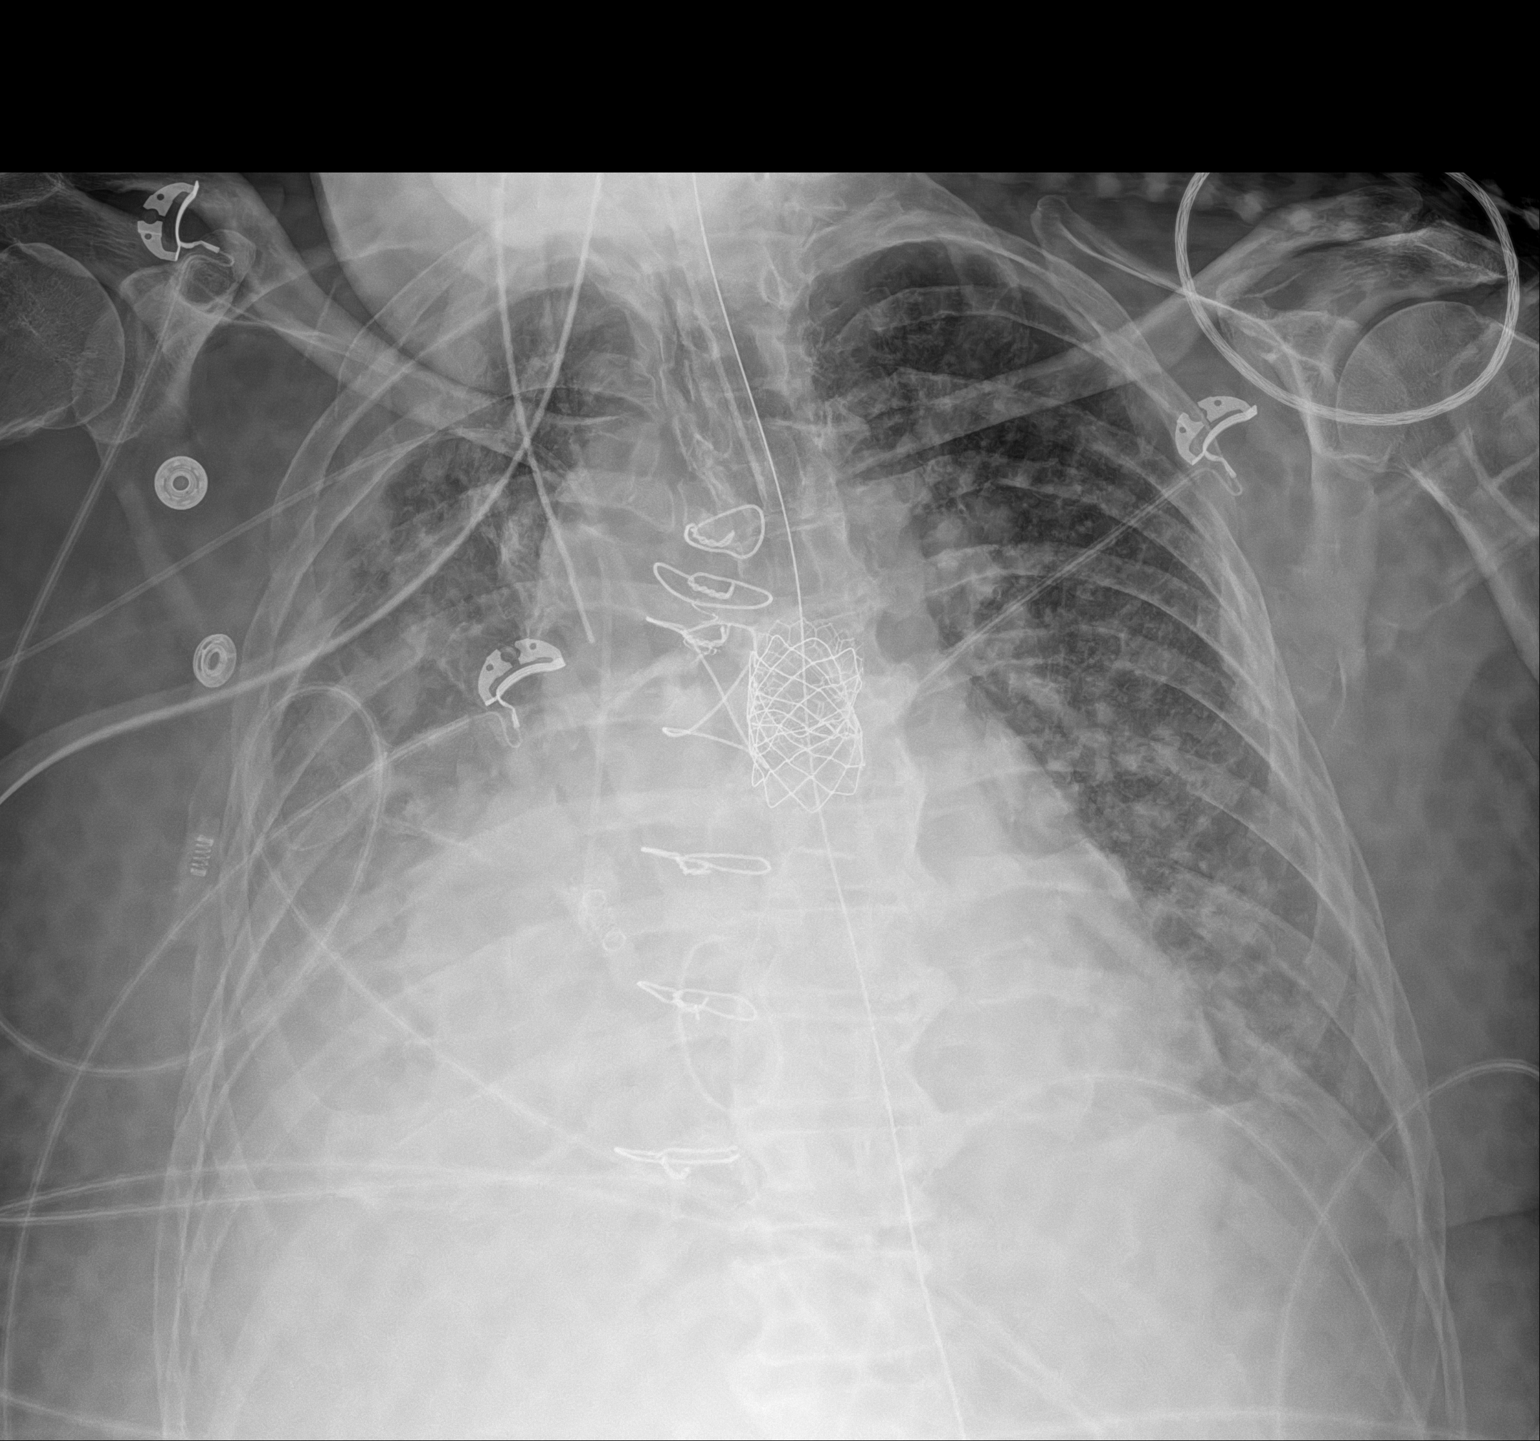

[1 of 1 positions shown; findings below may reference images not displayed]

FINDINGS: Endotracheal tube 3.2 cm above the carina, nasogastric tube
extending into the upper abdomen, and right upper extremity PICC
line with its tip within the superior right atrium are unchanged.

Pulmonary insufflation is stable. Moderate right pleural effusion is
suspected with resultant right basilar compressive atelectasis and
mild right-sided volume loss. Stable mild-to-moderate cardiomegaly
with changes of tricuspid valve replacement and stenting of the main
pulmonary artery. Mild perihilar and left basilar pulmonary
infiltrate likely relates to mild perihilar pulmonary edema, likely
cardiogenic in nature.
IMPRESSION: Stable support lines and tubes.

Stable moderate right pleural effusion with compressive right
basilar atelectasis.

Superimposed mild pulmonary edema, likely cardiogenic in nature.
